# Patient Record
Sex: Female | Born: 1945
Health system: Southern US, Community
[De-identification: ages and names within clinical notes are randomized; demographics above are authoritative.]

## PROBLEM LIST (undated history)

## (undated) DIAGNOSIS — K219 Gastro-esophageal reflux disease without esophagitis: Secondary | ICD-10-CM

## (undated) DIAGNOSIS — I10 Essential (primary) hypertension: Secondary | ICD-10-CM

## (undated) DIAGNOSIS — E559 Vitamin D deficiency, unspecified: Secondary | ICD-10-CM

## (undated) DIAGNOSIS — E785 Hyperlipidemia, unspecified: Secondary | ICD-10-CM

## (undated) DIAGNOSIS — E119 Type 2 diabetes mellitus without complications: Secondary | ICD-10-CM

## (undated) DIAGNOSIS — F329 Major depressive disorder, single episode, unspecified: Secondary | ICD-10-CM

## (undated) DIAGNOSIS — H5319 Other subjective visual disturbances: Secondary | ICD-10-CM

## (undated) DIAGNOSIS — I499 Cardiac arrhythmia, unspecified: Secondary | ICD-10-CM

## (undated) DIAGNOSIS — D649 Anemia, unspecified: Secondary | ICD-10-CM

## (undated) DIAGNOSIS — F419 Anxiety disorder, unspecified: Secondary | ICD-10-CM

## (undated) DIAGNOSIS — C801 Malignant (primary) neoplasm, unspecified: Secondary | ICD-10-CM

## (undated) DIAGNOSIS — F32A Depression, unspecified: Secondary | ICD-10-CM

## (undated) DIAGNOSIS — K589 Irritable bowel syndrome without diarrhea: Secondary | ICD-10-CM

## (undated) HISTORY — DX: Cardiac arrhythmia, unspecified: I49.9

## (undated) HISTORY — DX: Other subjective visual disturbances: H53.19

## (undated) HISTORY — DX: Major depressive disorder, single episode, unspecified: F32.9

## (undated) HISTORY — DX: Essential (primary) hypertension: I10

## (undated) HISTORY — DX: Depression, unspecified: F32.A

## (undated) HISTORY — DX: Type 2 diabetes mellitus without complications: E11.9

## (undated) HISTORY — DX: Malignant (primary) neoplasm, unspecified: C80.1

## (undated) HISTORY — DX: Anxiety disorder, unspecified: F41.9

## (undated) HISTORY — PX: CARPAL TUNNEL RELEASE: SHX101

## (undated) HISTORY — PX: SHOULDER SURGERY: SHX246

## (undated) HISTORY — PX: CATARACT EXTRACTION W/ INTRAOCULAR LENS  IMPLANT, BILATERAL: SHX1307

## (undated) HISTORY — DX: Irritable bowel syndrome, unspecified: K58.9

## (undated) HISTORY — DX: Anemia, unspecified: D64.9

## (undated) HISTORY — DX: Vitamin D deficiency, unspecified: E55.9

## (undated) HISTORY — DX: Gastro-esophageal reflux disease without esophagitis: K21.9

## (undated) HISTORY — DX: Hyperlipidemia, unspecified: E78.5

---

## 1987-02-22 HISTORY — PX: BUNIONECTOMY: SHX129

## 1993-09-23 DIAGNOSIS — C801 Malignant (primary) neoplasm, unspecified: Secondary | ICD-10-CM

## 1993-09-23 HISTORY — DX: Malignant (primary) neoplasm, unspecified: C80.1

## 1998-11-30 ENCOUNTER — Other Ambulatory Visit: Admission: RE | Admit: 1998-11-30 | Discharge: 1998-11-30 | Payer: Self-pay | Admitting: *Deleted

## 1999-05-11 ENCOUNTER — Ambulatory Visit (HOSPITAL_COMMUNITY): Admission: RE | Admit: 1999-05-11 | Discharge: 1999-05-11 | Payer: Self-pay | Admitting: *Deleted

## 1999-10-05 ENCOUNTER — Ambulatory Visit (HOSPITAL_COMMUNITY): Admission: RE | Admit: 1999-10-05 | Discharge: 1999-10-05 | Payer: Self-pay | Admitting: *Deleted

## 1999-11-09 ENCOUNTER — Ambulatory Visit (HOSPITAL_COMMUNITY): Admission: RE | Admit: 1999-11-09 | Discharge: 1999-11-09 | Payer: Self-pay | Admitting: *Deleted

## 2000-06-30 ENCOUNTER — Ambulatory Visit (HOSPITAL_COMMUNITY): Admission: RE | Admit: 2000-06-30 | Discharge: 2000-06-30 | Payer: Self-pay | Admitting: Gastroenterology

## 2001-03-18 ENCOUNTER — Other Ambulatory Visit: Admission: RE | Admit: 2001-03-18 | Discharge: 2001-03-18 | Payer: Self-pay | Admitting: Internal Medicine

## 2001-09-30 ENCOUNTER — Ambulatory Visit (HOSPITAL_BASED_OUTPATIENT_CLINIC_OR_DEPARTMENT_OTHER): Admission: RE | Admit: 2001-09-30 | Discharge: 2001-09-30 | Payer: Self-pay | Admitting: *Deleted

## 2001-10-21 ENCOUNTER — Ambulatory Visit (HOSPITAL_BASED_OUTPATIENT_CLINIC_OR_DEPARTMENT_OTHER): Admission: RE | Admit: 2001-10-21 | Discharge: 2001-10-21 | Payer: Self-pay | Admitting: *Deleted

## 2002-03-17 ENCOUNTER — Ambulatory Visit (HOSPITAL_COMMUNITY): Admission: RE | Admit: 2002-03-17 | Discharge: 2002-03-17 | Payer: Self-pay | Admitting: *Deleted

## 2002-03-17 ENCOUNTER — Encounter: Payer: Self-pay | Admitting: *Deleted

## 2002-03-18 ENCOUNTER — Other Ambulatory Visit: Admission: RE | Admit: 2002-03-18 | Discharge: 2002-03-18 | Payer: Self-pay | Admitting: Internal Medicine

## 2002-07-26 HISTORY — PX: OTHER SURGICAL HISTORY: SHX169

## 2002-07-29 ENCOUNTER — Encounter: Payer: Self-pay | Admitting: Internal Medicine

## 2002-07-29 ENCOUNTER — Ambulatory Visit (HOSPITAL_COMMUNITY): Admission: RE | Admit: 2002-07-29 | Discharge: 2002-07-29 | Payer: Self-pay | Admitting: Internal Medicine

## 2002-08-17 ENCOUNTER — Ambulatory Visit (HOSPITAL_BASED_OUTPATIENT_CLINIC_OR_DEPARTMENT_OTHER): Admission: RE | Admit: 2002-08-17 | Discharge: 2002-08-17 | Payer: Self-pay | Admitting: *Deleted

## 2003-03-08 ENCOUNTER — Encounter: Admission: RE | Admit: 2003-03-08 | Discharge: 2003-06-06 | Payer: Self-pay | Admitting: Internal Medicine

## 2003-04-05 ENCOUNTER — Other Ambulatory Visit: Admission: RE | Admit: 2003-04-05 | Discharge: 2003-04-05 | Payer: Self-pay | Admitting: Internal Medicine

## 2003-04-13 ENCOUNTER — Ambulatory Visit (HOSPITAL_COMMUNITY): Admission: RE | Admit: 2003-04-13 | Discharge: 2003-04-13 | Payer: Self-pay | Admitting: Internal Medicine

## 2003-04-13 ENCOUNTER — Encounter: Payer: Self-pay | Admitting: Internal Medicine

## 2003-11-14 ENCOUNTER — Ambulatory Visit (HOSPITAL_COMMUNITY): Admission: RE | Admit: 2003-11-14 | Discharge: 2003-11-14 | Payer: Self-pay | Admitting: Gastroenterology

## 2004-03-08 ENCOUNTER — Encounter: Admission: RE | Admit: 2004-03-08 | Discharge: 2004-03-08 | Payer: Self-pay | Admitting: Orthopedic Surgery

## 2004-04-09 HISTORY — PX: ANTERIOR CRUCIATE LIGAMENT REPAIR: SHX115

## 2004-12-13 ENCOUNTER — Ambulatory Visit: Payer: Self-pay

## 2005-04-17 ENCOUNTER — Ambulatory Visit (HOSPITAL_COMMUNITY): Admission: RE | Admit: 2005-04-17 | Discharge: 2005-04-17 | Payer: Self-pay | Admitting: Internal Medicine

## 2005-05-23 ENCOUNTER — Ambulatory Visit (HOSPITAL_COMMUNITY): Admission: RE | Admit: 2005-05-23 | Discharge: 2005-05-23 | Payer: Self-pay | Admitting: Orthopedic Surgery

## 2005-05-23 ENCOUNTER — Ambulatory Visit (HOSPITAL_BASED_OUTPATIENT_CLINIC_OR_DEPARTMENT_OTHER): Admission: RE | Admit: 2005-05-23 | Discharge: 2005-05-24 | Payer: Self-pay | Admitting: Orthopedic Surgery

## 2005-05-23 HISTORY — PX: OTHER SURGICAL HISTORY: SHX169

## 2006-04-14 ENCOUNTER — Other Ambulatory Visit: Admission: RE | Admit: 2006-04-14 | Discharge: 2006-04-14 | Payer: Self-pay | Admitting: Internal Medicine

## 2006-04-18 ENCOUNTER — Ambulatory Visit (HOSPITAL_COMMUNITY): Admission: RE | Admit: 2006-04-18 | Discharge: 2006-04-18 | Payer: Self-pay | Admitting: Internal Medicine

## 2007-08-31 ENCOUNTER — Ambulatory Visit (HOSPITAL_COMMUNITY): Admission: RE | Admit: 2007-08-31 | Discharge: 2007-08-31 | Payer: Self-pay | Admitting: Internal Medicine

## 2007-09-03 ENCOUNTER — Encounter: Admission: RE | Admit: 2007-09-03 | Discharge: 2007-09-03 | Payer: Self-pay | Admitting: Internal Medicine

## 2008-03-08 HISTORY — PX: BUNIONECTOMY: SHX129

## 2008-08-24 ENCOUNTER — Encounter: Payer: Self-pay | Admitting: Cardiovascular Disease

## 2008-08-31 ENCOUNTER — Ambulatory Visit (HOSPITAL_COMMUNITY): Admission: RE | Admit: 2008-08-31 | Discharge: 2008-08-31 | Payer: Self-pay | Admitting: Internal Medicine

## 2008-09-29 ENCOUNTER — Ambulatory Visit (HOSPITAL_COMMUNITY): Admission: RE | Admit: 2008-09-29 | Discharge: 2008-09-29 | Payer: Self-pay | Admitting: Internal Medicine

## 2008-10-20 ENCOUNTER — Encounter: Admission: RE | Admit: 2008-10-20 | Discharge: 2008-10-20 | Payer: Self-pay | Admitting: Neurosurgery

## 2008-12-26 ENCOUNTER — Ambulatory Visit (HOSPITAL_COMMUNITY): Admission: RE | Admit: 2008-12-26 | Discharge: 2008-12-26 | Payer: Self-pay | Admitting: Internal Medicine

## 2009-07-10 ENCOUNTER — Ambulatory Visit (HOSPITAL_COMMUNITY): Admission: RE | Admit: 2009-07-10 | Discharge: 2009-07-10 | Payer: Self-pay | Admitting: Internal Medicine

## 2009-07-10 ENCOUNTER — Encounter: Payer: Self-pay | Admitting: Cardiovascular Disease

## 2009-08-08 ENCOUNTER — Ambulatory Visit: Payer: Self-pay | Admitting: Cardiovascular Disease

## 2009-08-08 DIAGNOSIS — I10 Essential (primary) hypertension: Secondary | ICD-10-CM | POA: Insufficient documentation

## 2009-08-08 DIAGNOSIS — K219 Gastro-esophageal reflux disease without esophagitis: Secondary | ICD-10-CM | POA: Insufficient documentation

## 2009-08-08 DIAGNOSIS — D649 Anemia, unspecified: Secondary | ICD-10-CM | POA: Insufficient documentation

## 2009-08-08 DIAGNOSIS — F325 Major depressive disorder, single episode, in full remission: Secondary | ICD-10-CM | POA: Insufficient documentation

## 2009-08-08 DIAGNOSIS — J45909 Unspecified asthma, uncomplicated: Secondary | ICD-10-CM | POA: Insufficient documentation

## 2009-08-25 ENCOUNTER — Other Ambulatory Visit: Admission: RE | Admit: 2009-08-25 | Discharge: 2009-08-25 | Payer: Self-pay | Admitting: Internal Medicine

## 2009-09-01 ENCOUNTER — Ambulatory Visit (HOSPITAL_COMMUNITY): Admission: RE | Admit: 2009-09-01 | Discharge: 2009-09-01 | Payer: Self-pay | Admitting: Cardiovascular Disease

## 2009-09-01 ENCOUNTER — Ambulatory Visit: Payer: Self-pay | Admitting: Internal Medicine

## 2009-09-01 ENCOUNTER — Encounter: Payer: Self-pay | Admitting: Cardiovascular Disease

## 2009-09-01 ENCOUNTER — Ambulatory Visit: Payer: Self-pay

## 2010-07-10 ENCOUNTER — Ambulatory Visit (HOSPITAL_COMMUNITY): Admission: RE | Admit: 2010-07-10 | Discharge: 2010-07-10 | Payer: Self-pay | Admitting: Internal Medicine

## 2010-10-14 ENCOUNTER — Encounter: Payer: Self-pay | Admitting: Internal Medicine

## 2011-02-08 NOTE — Op Note (Signed)
[place]. [facility]  Patient:    Nancy Myers, Nancy Myers Visit Number: [id number] MRN: [id number]          Service Type: DSU Location: [facility] Attending Physician:  Kendell Bane Dictated by:   Lowell Bouton, M.D. Proc. Date: 09/30/01 Admit Date:  09/30/2001                             Operative Report  PREOPERATIVE DIAGNOSIS:  Left carpal tunnel syndrome.  POSTOPERATIVE DIAGNOSIS:  Left carpal tunnel syndrome.  OPERATION PERFORMED:  Decompression median nerve, left carpal tunnel.  SURGEON:  Lowell Bouton, M.D.  ANESTHESIA:  0.5% Marcaine local with sedation.  OPERATIVE FINDINGS:  The patient had no masses in the carpal canal and the motor branch was intact.  The median nerve was significantly compressed.  DESCRIPTION OF PROCEDURE:  Under 0.5% Marcaine local anesthesia with a tourniquet on the left arm, the left hand was prepped and draped in the usual fashion and after exsanguinating the limb, the tourniquet was inflated to 250 mmHg.  A 3 cm longitudinal incision was made in the palm just ulnar to the thenar crease.  Sharp dissection was carried through the subcutaneous tissues and bleeding points were coagulated.  Blunt dissection was carried down through the superficial palmar fascia distal to the transverse carpal ligament.  A hemostat was placed in the carpal canal up against the hook of the hamate and the transverse carpal ligament was divided on the ulnar border of the median nerve.  The proximal end of the ligament was divided with the scissors after dissecting the nerve away from the undersurface of the ligament.  The carpal canal was then palpated and was found to be adequately decompressed.  The nerve was examined and motor branch identified.  The wound was irrigated with saline and the skin was closed with 4-0 nylon sutures. Sterile dressings were applied followed by a volar wrist splint.  The  patient tolerated the procedure well and went to the recovery room awake and stable in good condition. Dictated by:   Lowell Bouton, M.D. Attending Physician:  Kendell Bane DD:  09/30/01 TD:  09/30/01 Job: [id number]

## 2011-02-08 NOTE — Op Note (Signed)
Nancy Myers, Nancy Myers                         ACCOUNT NO.:  [id number]   MEDICAL RECORD NO.:  [id number]                   PATIENT TYPE:  AMB   LOCATION:  DSC                                  FACILITY:  [facility]   PHYSICIAN:  Reynolds Bowl, M.D.                 DATE OF BIRTH:  March 28, 1946   DATE OF PROCEDURE:  DATE OF DISCHARGE:                                 OPERATIVE REPORT   PREOPERATIVE DIAGNOSIS:  Left knee lateral meniscus tear and notch  ganglions.   POSTOPERATIVE DIAGNOSES:  1. 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  2. Posterior horn flap tear, medial meniscus.  3. Grade 2 changes, femoral trochlea.   OPERATIVE PROCEDURE:  1. Arthroscopy left knee.  2. Partial medial meniscectomy.  3. Incidental trimming of irregular edges of lateral femoral condyle.  4. Incidental minor debridement of chondromalacic lateral femoral condyle     along with lavage of particulate debris.   ANESTHESIA:  Local with standby.   DESCRIPTION OF PROCEDURE:  The patient was given anesthetic.  The left lower  extremity was fitted with a tourniquet which was isolated with a U-drape.  From the tips of the toes to the edges of the drape she was prepped with  DuraPrep. She was then draped in the usual manner, following which the leg  was exsanguinated using an Esmarch bandage and the proximal pneumatic  tourniquet elevated to 300 mmHg.  An anterolateral portal was established,  then anteromedial portal established by direct vision.  Immediately on  inspecting the suprapatellar area, there was a lot of particulate and white  debris washed out.  Past down around the cheek of the medial femoral  condyle, there was little bits of debris attached to synovial villi, and on  the medial side there were probably grade 2 changes of the tibial plateau  and femoral condyle, and at the posterior horn there was a posterior horn  flap tear in the medial meniscus.  While there this was  resected back to  stable tissue.  Moving into the notch area, there was irregular soft tissue  about the anterior cruciate which when removed revealed intact cruciate.  Laterally, there were grade 3 chondromalacia changes, and this was the  assumed source of the particulate debris.  This articular cartilage could be  rubbed and debris liberated.  I used a rotary meniscotome and very lightly  debrided this. The posterior horn of the lateral meniscus had a fairly  ragged frayed edge, which incidentally was trimmed, and the remainder of the  meniscus then appeared to be stable.  Went back and used the scope and probe  for bits of debris which were removed and on the way out took a photograph  of the femoral trochlea which shows grade 2 and 3 changes of the femoral  trochlea.  Having lavaged the joint of free particulate matter, I removed  the instruments, closed the anteromedial portal, then injected Marcaine with  epinephrine to the anterolateral portal, then closed that portal, then  dressed the knee with 4 x 4s, ABD, Kerlix, Webril, then an Ace wrap.   DISCHARGE INSTRUCTIONS:  1. The patient is to take Vicodin for pain, as discussed.  2. Take all usual medications.  3. Do frequent quad sets and ankle pumps.  4. Use cold packs, as tolerated.  5.     Be at house rest.  6. Walk full weightbearing, minimizing knee motion for about three days.  7. Call me with concerns, otherwise see me in the office as planned.                                               Reynolds Bowl, M.D.    JWK/MEDQ  D:  08/17/2002  T:  08/17/2002  Job:  [id number]

## 2011-02-08 NOTE — Op Note (Signed)
NAMESURIE, SUCHOCKI             ACCOUNT NO.:  [id number]   MEDICAL RECORD NO.:  [id number]          PATIENT TYPE:  AMB   LOCATION:  DSC                          FACILITY:  [facility]   PHYSICIAN:  Nadara Mustard, MD     DATE OF BIRTH:  1946/02/05   DATE OF PROCEDURE:  05/23/2005  DATE OF DISCHARGE:                                 OPERATIVE REPORT   PREOP DIAGNOSIS:  Unstable left foot Lisfranc fracture-dislocation.   POSTOP DIAGNOSIS:  Unstable left foot Lisfranc fracture-dislocation.   PROCEDURE:  Open reduction internal fixation left foot, Lisfranc fracture-  dislocation.   SURGEON:  Nadara Mustard, MD   ANESTHESIA:  Popliteal block plus LMA.   ESTIMATED BLOOD LOSS:  Minimal.   ANTIBIOTICS:  1 gram of Kefzol.   TOURNIQUET TIME:  Esmarch at the ankle for approximately 34 minutes.   DISPOSITION:  To PACU in stable condition.   INDICATIONS FOR PROCEDURE:  The patient is a 65 year old woman with a  Lisfranc fracture-dislocation who has failed conservative care and presents,  at this time, for a surgical stabilization. The risks and benefits were  discussed including infection, neurovascular injury, persistent pain, need  for additional surgery. The patient states he understands and wishes to  proceed at this time.   DESCRIPTION OF PROCEDURE:  The patient was brought to OR room five after  undergoing a popliteal block. After an adequate level of anesthesia  obtained, the patient's left lower extremity was prepped using DuraPrep and  draped into a sterile field. An Esmarch was wrapped around the ankle for  tourniquet control. A dorsal incision was made over the Lisfranc joint.  Blunt dissection was carried down to the dorsalis pedis neurovascular  bundle. This was retracted laterally and the first metatarsal medial  cuneiform joint was identified. This joint was unstable and this was  stabilized using a 3.5 cortical screw, 40-mm in length and the screw was  placed  dorsal-distal to proximal-plantar, extending from the first  metatarsal into the medial and middle cuneiforms.   Attention was then focused to the Lisfranc joint. The Lisfranc joint was  reduced. An oblique screw was placed from the medial cuneiform across the  Lisfranc joint into the second metatarsal. This screw also was 40 mm in  length. A third screw was then placed from dorsal-distal of the second  metatarsal to plantar-proximal into the middle cuneiform. C-arm fluoroscopy  verified reduction in both AP and lateral planes. The patient did have a  branch of the deep peroneal nerve which was scarred down to the Lisfranc  joint. This was elevated. The distal aspect of the nerve was constricted.  The nerve was left intact. The Esmarch was released.   The wound was irrigated with normal saline and the incisions were closed  using 2-0 nylon with a far-and-near and near-and-far suture without any  tension on the skin. The wounds were covered with Adaptic, orthopedic  sponges, sterile Webril and a Coban dressing. The patient was extubated and  taken to PACU in stable condition.      Nadara Mustard, MD  Electronically Signed  MVD/MEDQ  D:  05/23/2005  T:  05/23/2005  Job:  [id number]

## 2011-02-08 NOTE — Op Note (Signed)
[place]. [facility]  Patient:    MELONI, HINZ Visit Number: [id number] MRN: [id number]          Service Type: DSU Location: [facility] Attending Physician:  Kendell Bane Dictated by:   Lowell Bouton, M.D. Proc. Date: 10/21/01 Admit Date:  10/21/2001                             Operative Report  PREOPERATIVE DIAGNOSIS:  Right carpal tunnel syndrome.  POSTOPERATIVE DIAGNOSIS:  Right carpal tunnel syndrome.  PROCEDURE:  Decompression of median nerve, right carpal tunnel.  SURGEON:  Lowell Bouton, M.D.  ANESTHESIA:  Half percent Marcaine local with sedation.  OPERATIVE FINDINGS:  The patient had no masses in the carpal canal and the motor branch was intact.  DESCRIPTION OF PROCEDURE:  Under 0.5% Marcaine local anesthesia with a tourniquet on the right arm, the right hand was prepped and draped in the usual fashion, and after exsanguinating the limb, the tourniquet was inflated to 225 mmHg.  A 3 cm longitudinal incision was made in the palm just ulnar to the thenar crease.  Sharp dissection was carried through the subcutaneous tissues and bleeding points were coagulated.  Blunt dissection was carried through the superficial palmar fascia down to the transverse carpal ligament. A hemostat was then placed in the carpal canal up against the hook of the hamate and the transverse carpal ligament was divided on the ulnar border of the median nerve.  The proximal end of the ligament was divided with the scissors after dissecting the nerve away from the under surface of the ligament.  The carpal canal was then palpated and was found to be adequately decompressed.  The nerve was examined and the motor branch was identified. The carpal canal was examined and there were no masses present.  The wound was then irrigated with saline and the skin was closed with 4-0 nylon sutures. Sterile dressings were applied followed by a volar  wrist splint.  The patient tolerated the procedure well and went to the recovery room awake, stable, and in good condition. Dictated by:   Lowell Bouton, M.D. Attending Physician:  Kendell Bane DD:  10/21/01 TD:  10/22/01 Job: [phone]

## 2011-02-08 NOTE — Op Note (Signed)
NAMESKARLETH, DELMONICO                         ACCOUNT NO.:  [id number]   MEDICAL RECORD NO.:  [id number]                   PATIENT TYPE:  AMB   LOCATION:  ENDO                                 FACILITY:  [facility]   PHYSICIAN:  James L. Malon Kindle., M.D.          DATE OF BIRTH:  02-Sep-1946   DATE OF PROCEDURE:  11/14/2003  DATE OF DISCHARGE:                                 OPERATIVE REPORT   PROCEDURE:  Colonoscopy.   MEDICATIONS:  Fentanyl 75 mcg, Versed 6 mg IV.   ENDOSCOPE:  Olympus pediatric colonoscope.   INDICATIONS:  Previous history of adenomatous colon polyps, V12.72.   DESCRIPTION OF PROCEDURE:  The procedure had been explained to the patient  and consent obtained.  With the patient in the left lateral decubitus  position, the Olympus scope was inserted and advanced.  The prep was  excellent.  The patient had a long, tortuous colon, and we were able to  advance to the cecum.  The ileocecal valve and appendiceal orifice were  seen.  Abdominal pressure and position changes were required, and with the  patient in the right lateral decubitus position we were able to reach the  cecum.  The terminal ileum was actually entered for a short distance and was  normal.  The scope was withdrawn and the cecum, ascending colon, transverse  colon, splenic flexure, descending and sigmoid colon were seen well and no  polyps were seen.  There was no diverticular disease of significance.  The  scope was withdrawn and the rectum was free of polyps.  The patient  tolerated the procedure well.   ASSESSMENT:  Previous history of colon polyps, V12.72.  Negative colonoscopy  now.   PLAN:  Will recommend yearly Hemoccults and recommend repeating colonoscopy  in five years.                                               James L. Malon Kindle., M.D.    Waldron Session  D:  11/14/2003  T:  11/14/2003  Job:  [id number]   cc:   Lovenia Kim, D.O.  [address], [address]  [place]  [place] [address]  Fax: [phone]

## 2011-02-08 NOTE — Procedures (Signed)
[facility]  Patient:    Nancy Myers, Nancy Myers                    MRN: [id number] Proc. Date: 06/30/00 Adm. Date:  [id number] Attending:  Orland Mustard CC:         Nicky Pugh. Lenon Ahmadi, M.D.   Procedure Report  PROCEDURE:  Colonoscopy.  MEDICATIONS:  Fentanyl 75 mcg, Versed 7 mg IV.  INDICATIONS:  A previous history of adenomatous colon polyp.  SCOPE:  Adult Olympus video colonoscope.  DESCRIPTION OF PROCEDURE:  The procedure had been explained to the patient and consent obtained.  With the patient in the left lateral decubitus, the adult Olympus adult video colonoscope was inserted and advanced under direct visualization.  The prep was excellent.  I was able to reach the cecum using abdominal pressure and position changes.  The ileocecal valve and appendiceal orifice were identified.  The scope was withdrawn.  The cecum and ascending colon, hepatic flexure, transverse colon, splenic flexure, descending, and sigmoid colon were seen well upon removal.  No polyps or other lesions seen. Internal hemorrhoids seen in the rectum upon removal of the scope.  The scope was withdrawn, and the patient tolerated the procedure well.  ASSESSMENT: 1. No evidence of polyps. 2. Internal hemorrhoids.  PLAN:  Will recommend repeating in three years since this woman did have a previous history of polyps with cancer in the polyp. DD:  06/30/00 TD:  07/01/00 Job: [id number]

## 2011-09-03 ENCOUNTER — Other Ambulatory Visit (HOSPITAL_COMMUNITY): Payer: Self-pay | Admitting: Internal Medicine

## 2011-09-03 ENCOUNTER — Ambulatory Visit (HOSPITAL_COMMUNITY)
Admission: RE | Admit: 2011-09-03 | Discharge: 2011-09-03 | Disposition: A | Discharge status: Home / Self Care | Payer: BC Managed Care – PPO | Source: Ambulatory Visit | Attending: Internal Medicine | Admitting: Internal Medicine

## 2011-09-03 DIAGNOSIS — Z1231 Encounter for screening mammogram for malignant neoplasm of breast: Secondary | ICD-10-CM

## 2011-09-03 DIAGNOSIS — J209 Acute bronchitis, unspecified: Secondary | ICD-10-CM

## 2011-09-03 DIAGNOSIS — I10 Essential (primary) hypertension: Secondary | ICD-10-CM | POA: Insufficient documentation

## 2011-09-03 DIAGNOSIS — J45909 Unspecified asthma, uncomplicated: Secondary | ICD-10-CM | POA: Insufficient documentation

## 2011-10-02 DIAGNOSIS — M25579 Pain in unspecified ankle and joints of unspecified foot: Secondary | ICD-10-CM | POA: Diagnosis not present

## 2011-10-02 DIAGNOSIS — M25569 Pain in unspecified knee: Secondary | ICD-10-CM | POA: Diagnosis not present

## 2011-10-08 ENCOUNTER — Ambulatory Visit (HOSPITAL_COMMUNITY)
Admission: RE | Admit: 2011-10-08 | Discharge: 2011-10-08 | Disposition: A | Discharge status: Home / Self Care | Payer: BC Managed Care – PPO | Source: Ambulatory Visit | Attending: Internal Medicine | Admitting: Internal Medicine

## 2011-10-08 DIAGNOSIS — Z1231 Encounter for screening mammogram for malignant neoplasm of breast: Secondary | ICD-10-CM | POA: Insufficient documentation

## 2011-12-18 DIAGNOSIS — I10 Essential (primary) hypertension: Secondary | ICD-10-CM | POA: Diagnosis not present

## 2011-12-18 DIAGNOSIS — R5381 Other malaise: Secondary | ICD-10-CM | POA: Diagnosis not present

## 2011-12-18 DIAGNOSIS — R002 Palpitations: Secondary | ICD-10-CM | POA: Diagnosis not present

## 2011-12-18 DIAGNOSIS — R5383 Other fatigue: Secondary | ICD-10-CM | POA: Diagnosis not present

## 2011-12-18 DIAGNOSIS — E782 Mixed hyperlipidemia: Secondary | ICD-10-CM | POA: Diagnosis not present

## 2012-01-21 DIAGNOSIS — R109 Unspecified abdominal pain: Secondary | ICD-10-CM | POA: Diagnosis not present

## 2012-01-22 ENCOUNTER — Other Ambulatory Visit: Payer: Self-pay | Admitting: Internal Medicine

## 2012-01-23 ENCOUNTER — Ambulatory Visit
Admission: RE | Admit: 2012-01-23 | Discharge: 2012-01-23 | Disposition: A | Discharge status: Home / Self Care | Payer: Medicare Other | Source: Ambulatory Visit | Attending: Internal Medicine | Admitting: Internal Medicine

## 2012-01-23 DIAGNOSIS — N949 Unspecified condition associated with female genital organs and menstrual cycle: Secondary | ICD-10-CM | POA: Diagnosis not present

## 2012-01-23 DIAGNOSIS — K5732 Diverticulitis of large intestine without perforation or abscess without bleeding: Secondary | ICD-10-CM | POA: Diagnosis not present

## 2012-01-23 MED ORDER — IOHEXOL 300 MG/ML  SOLN
100.0000 mL | Freq: Once | INTRAMUSCULAR | Status: AC | PRN
  Administered 2012-01-23: 100 mL via INTRAVENOUS

## 2012-01-24 ENCOUNTER — Other Ambulatory Visit: Payer: Self-pay | Admitting: Internal Medicine

## 2012-01-24 ENCOUNTER — Ambulatory Visit
Admission: RE | Admit: 2012-01-24 | Discharge: 2012-01-24 | Disposition: A | Discharge status: Home / Self Care | Payer: BC Managed Care – PPO | Source: Ambulatory Visit | Attending: Internal Medicine | Admitting: Internal Medicine

## 2012-01-24 DIAGNOSIS — T18108A Unspecified foreign body in esophagus causing other injury, initial encounter: Secondary | ICD-10-CM

## 2012-01-27 DIAGNOSIS — I10 Essential (primary) hypertension: Secondary | ICD-10-CM | POA: Diagnosis not present

## 2012-01-27 DIAGNOSIS — R109 Unspecified abdominal pain: Secondary | ICD-10-CM | POA: Diagnosis not present

## 2012-01-29 DIAGNOSIS — K921 Melena: Secondary | ICD-10-CM | POA: Diagnosis not present

## 2012-01-29 DIAGNOSIS — K5732 Diverticulitis of large intestine without perforation or abscess without bleeding: Secondary | ICD-10-CM | POA: Diagnosis not present

## 2012-01-29 DIAGNOSIS — Z86010 Personal history of colonic polyps: Secondary | ICD-10-CM | POA: Diagnosis not present

## 2012-01-31 DIAGNOSIS — I10 Essential (primary) hypertension: Secondary | ICD-10-CM | POA: Diagnosis not present

## 2012-01-31 DIAGNOSIS — R5381 Other malaise: Secondary | ICD-10-CM | POA: Diagnosis not present

## 2012-01-31 DIAGNOSIS — R5383 Other fatigue: Secondary | ICD-10-CM | POA: Diagnosis not present

## 2012-02-01 LAB — HM COLONOSCOPY

## 2012-02-06 DIAGNOSIS — I10 Essential (primary) hypertension: Secondary | ICD-10-CM | POA: Diagnosis not present

## 2012-02-11 DIAGNOSIS — K5730 Diverticulosis of large intestine without perforation or abscess without bleeding: Secondary | ICD-10-CM | POA: Diagnosis not present

## 2012-02-11 DIAGNOSIS — Z86010 Personal history of colonic polyps: Secondary | ICD-10-CM | POA: Diagnosis not present

## 2012-02-11 DIAGNOSIS — K648 Other hemorrhoids: Secondary | ICD-10-CM | POA: Diagnosis not present

## 2012-02-11 DIAGNOSIS — R195 Other fecal abnormalities: Secondary | ICD-10-CM | POA: Diagnosis not present

## 2012-03-16 DIAGNOSIS — K5732 Diverticulitis of large intestine without perforation or abscess without bleeding: Secondary | ICD-10-CM | POA: Diagnosis not present

## 2012-03-30 DIAGNOSIS — H52209 Unspecified astigmatism, unspecified eye: Secondary | ICD-10-CM | POA: Diagnosis not present

## 2012-03-30 DIAGNOSIS — E119 Type 2 diabetes mellitus without complications: Secondary | ICD-10-CM | POA: Diagnosis not present

## 2012-03-30 DIAGNOSIS — Z961 Presence of intraocular lens: Secondary | ICD-10-CM | POA: Diagnosis not present

## 2012-04-14 DIAGNOSIS — E559 Vitamin D deficiency, unspecified: Secondary | ICD-10-CM | POA: Diagnosis not present

## 2012-04-14 DIAGNOSIS — Z79899 Other long term (current) drug therapy: Secondary | ICD-10-CM | POA: Diagnosis not present

## 2012-04-14 DIAGNOSIS — E119 Type 2 diabetes mellitus without complications: Secondary | ICD-10-CM | POA: Diagnosis not present

## 2012-04-14 DIAGNOSIS — E782 Mixed hyperlipidemia: Secondary | ICD-10-CM | POA: Diagnosis not present

## 2012-04-14 DIAGNOSIS — I10 Essential (primary) hypertension: Secondary | ICD-10-CM | POA: Diagnosis not present

## 2012-07-15 DIAGNOSIS — E782 Mixed hyperlipidemia: Secondary | ICD-10-CM | POA: Diagnosis not present

## 2012-07-15 DIAGNOSIS — E119 Type 2 diabetes mellitus without complications: Secondary | ICD-10-CM | POA: Diagnosis not present

## 2012-07-15 DIAGNOSIS — Z23 Encounter for immunization: Secondary | ICD-10-CM | POA: Diagnosis not present

## 2012-07-15 DIAGNOSIS — I10 Essential (primary) hypertension: Secondary | ICD-10-CM | POA: Diagnosis not present

## 2012-07-15 DIAGNOSIS — E559 Vitamin D deficiency, unspecified: Secondary | ICD-10-CM | POA: Diagnosis not present

## 2012-07-15 DIAGNOSIS — R079 Chest pain, unspecified: Secondary | ICD-10-CM | POA: Diagnosis not present

## 2012-07-20 DIAGNOSIS — I10 Essential (primary) hypertension: Secondary | ICD-10-CM | POA: Diagnosis not present

## 2012-09-03 DIAGNOSIS — E559 Vitamin D deficiency, unspecified: Secondary | ICD-10-CM | POA: Diagnosis not present

## 2012-09-03 DIAGNOSIS — D518 Other vitamin B12 deficiency anemias: Secondary | ICD-10-CM | POA: Diagnosis not present

## 2012-09-03 DIAGNOSIS — Z23 Encounter for immunization: Secondary | ICD-10-CM | POA: Diagnosis not present

## 2012-09-03 DIAGNOSIS — E782 Mixed hyperlipidemia: Secondary | ICD-10-CM | POA: Diagnosis not present

## 2012-09-03 DIAGNOSIS — I10 Essential (primary) hypertension: Secondary | ICD-10-CM | POA: Diagnosis not present

## 2012-09-03 DIAGNOSIS — E119 Type 2 diabetes mellitus without complications: Secondary | ICD-10-CM | POA: Diagnosis not present

## 2012-09-03 DIAGNOSIS — Z79899 Other long term (current) drug therapy: Secondary | ICD-10-CM | POA: Diagnosis not present

## 2012-09-28 DIAGNOSIS — M810 Age-related osteoporosis without current pathological fracture: Secondary | ICD-10-CM | POA: Diagnosis not present

## 2012-12-11 DIAGNOSIS — L500 Allergic urticaria: Secondary | ICD-10-CM | POA: Diagnosis not present

## 2012-12-11 DIAGNOSIS — I10 Essential (primary) hypertension: Secondary | ICD-10-CM | POA: Diagnosis not present

## 2012-12-15 DIAGNOSIS — I10 Essential (primary) hypertension: Secondary | ICD-10-CM | POA: Diagnosis not present

## 2012-12-15 DIAGNOSIS — E119 Type 2 diabetes mellitus without complications: Secondary | ICD-10-CM | POA: Diagnosis not present

## 2012-12-15 DIAGNOSIS — E559 Vitamin D deficiency, unspecified: Secondary | ICD-10-CM | POA: Diagnosis not present

## 2012-12-15 DIAGNOSIS — Z79899 Other long term (current) drug therapy: Secondary | ICD-10-CM | POA: Diagnosis not present

## 2012-12-15 DIAGNOSIS — E782 Mixed hyperlipidemia: Secondary | ICD-10-CM | POA: Diagnosis not present

## 2013-01-04 DIAGNOSIS — L509 Urticaria, unspecified: Secondary | ICD-10-CM | POA: Diagnosis not present

## 2013-01-04 DIAGNOSIS — R5383 Other fatigue: Secondary | ICD-10-CM | POA: Diagnosis not present

## 2013-01-04 DIAGNOSIS — R5381 Other malaise: Secondary | ICD-10-CM | POA: Diagnosis not present

## 2013-03-17 DIAGNOSIS — E119 Type 2 diabetes mellitus without complications: Secondary | ICD-10-CM | POA: Diagnosis not present

## 2013-03-17 DIAGNOSIS — E782 Mixed hyperlipidemia: Secondary | ICD-10-CM | POA: Diagnosis not present

## 2013-03-17 DIAGNOSIS — I10 Essential (primary) hypertension: Secondary | ICD-10-CM | POA: Diagnosis not present

## 2013-03-17 DIAGNOSIS — Z79899 Other long term (current) drug therapy: Secondary | ICD-10-CM | POA: Diagnosis not present

## 2013-03-17 DIAGNOSIS — E559 Vitamin D deficiency, unspecified: Secondary | ICD-10-CM | POA: Diagnosis not present

## 2013-06-14 ENCOUNTER — Other Ambulatory Visit (HOSPITAL_COMMUNITY): Payer: Self-pay | Admitting: Internal Medicine

## 2013-06-14 DIAGNOSIS — Z1231 Encounter for screening mammogram for malignant neoplasm of breast: Secondary | ICD-10-CM

## 2013-06-15 LAB — HM MAMMOGRAPHY: HM Mammogram: NEGATIVE

## 2013-06-17 ENCOUNTER — Ambulatory Visit (HOSPITAL_COMMUNITY)
Admission: RE | Admit: 2013-06-17 | Discharge: 2013-06-17 | Disposition: A | Discharge status: Home / Self Care | Payer: Medicare Other | Source: Ambulatory Visit | Attending: Internal Medicine | Admitting: Internal Medicine

## 2013-06-17 DIAGNOSIS — R059 Cough, unspecified: Secondary | ICD-10-CM | POA: Diagnosis not present

## 2013-06-17 DIAGNOSIS — Z1231 Encounter for screening mammogram for malignant neoplasm of breast: Secondary | ICD-10-CM | POA: Diagnosis not present

## 2013-06-17 DIAGNOSIS — R5381 Other malaise: Secondary | ICD-10-CM | POA: Diagnosis not present

## 2013-06-17 DIAGNOSIS — I10 Essential (primary) hypertension: Secondary | ICD-10-CM | POA: Diagnosis not present

## 2013-06-17 DIAGNOSIS — E538 Deficiency of other specified B group vitamins: Secondary | ICD-10-CM | POA: Diagnosis not present

## 2013-06-17 DIAGNOSIS — E782 Mixed hyperlipidemia: Secondary | ICD-10-CM | POA: Diagnosis not present

## 2013-06-17 DIAGNOSIS — Z23 Encounter for immunization: Secondary | ICD-10-CM | POA: Diagnosis not present

## 2013-06-17 DIAGNOSIS — E559 Vitamin D deficiency, unspecified: Secondary | ICD-10-CM | POA: Diagnosis not present

## 2013-06-17 DIAGNOSIS — Z79899 Other long term (current) drug therapy: Secondary | ICD-10-CM | POA: Diagnosis not present

## 2013-06-17 DIAGNOSIS — E119 Type 2 diabetes mellitus without complications: Secondary | ICD-10-CM | POA: Diagnosis not present

## 2013-06-17 DIAGNOSIS — R05 Cough: Secondary | ICD-10-CM | POA: Diagnosis not present

## 2013-08-02 ENCOUNTER — Emergency Department (HOSPITAL_COMMUNITY): Payer: Medicare Other

## 2013-08-02 ENCOUNTER — Ambulatory Visit: Payer: Medicare Other | Admitting: Emergency Medicine

## 2013-08-02 ENCOUNTER — Encounter: Payer: Self-pay | Admitting: Emergency Medicine

## 2013-08-02 ENCOUNTER — Encounter (HOSPITAL_COMMUNITY): Payer: Self-pay | Admitting: Emergency Medicine

## 2013-08-02 ENCOUNTER — Emergency Department (HOSPITAL_COMMUNITY)
Admission: EM | Admit: 2013-08-02 | Discharge: 2013-08-02 | Disposition: A | Discharge status: Home / Self Care | Payer: Medicare Other | Attending: Emergency Medicine | Admitting: Emergency Medicine

## 2013-08-02 VITALS — BP 126/84 | HR 76 | Temp 98.6°F | Resp 18 | Wt 154.0 lb

## 2013-08-02 DIAGNOSIS — E785 Hyperlipidemia, unspecified: Secondary | ICD-10-CM | POA: Insufficient documentation

## 2013-08-02 DIAGNOSIS — K219 Gastro-esophageal reflux disease without esophagitis: Secondary | ICD-10-CM | POA: Insufficient documentation

## 2013-08-02 DIAGNOSIS — K589 Irritable bowel syndrome without diarrhea: Secondary | ICD-10-CM | POA: Diagnosis not present

## 2013-08-02 DIAGNOSIS — F411 Generalized anxiety disorder: Secondary | ICD-10-CM | POA: Diagnosis not present

## 2013-08-02 DIAGNOSIS — E119 Type 2 diabetes mellitus without complications: Secondary | ICD-10-CM | POA: Diagnosis not present

## 2013-08-02 DIAGNOSIS — Z85038 Personal history of other malignant neoplasm of large intestine: Secondary | ICD-10-CM | POA: Diagnosis not present

## 2013-08-02 DIAGNOSIS — Z8249 Family history of ischemic heart disease and other diseases of the circulatory system: Secondary | ICD-10-CM | POA: Insufficient documentation

## 2013-08-02 DIAGNOSIS — E559 Vitamin D deficiency, unspecified: Secondary | ICD-10-CM | POA: Diagnosis not present

## 2013-08-02 DIAGNOSIS — I498 Other specified cardiac arrhythmias: Secondary | ICD-10-CM | POA: Insufficient documentation

## 2013-08-02 DIAGNOSIS — R079 Chest pain, unspecified: Secondary | ICD-10-CM

## 2013-08-02 DIAGNOSIS — E1149 Type 2 diabetes mellitus with other diabetic neurological complication: Secondary | ICD-10-CM | POA: Insufficient documentation

## 2013-08-02 DIAGNOSIS — Z885 Allergy status to narcotic agent status: Secondary | ICD-10-CM | POA: Insufficient documentation

## 2013-08-02 DIAGNOSIS — M79609 Pain in unspecified limb: Secondary | ICD-10-CM | POA: Insufficient documentation

## 2013-08-02 DIAGNOSIS — F329 Major depressive disorder, single episode, unspecified: Secondary | ICD-10-CM | POA: Insufficient documentation

## 2013-08-02 DIAGNOSIS — Z79899 Other long term (current) drug therapy: Secondary | ICD-10-CM | POA: Insufficient documentation

## 2013-08-02 DIAGNOSIS — D649 Anemia, unspecified: Secondary | ICD-10-CM | POA: Diagnosis not present

## 2013-08-02 DIAGNOSIS — F3289 Other specified depressive episodes: Secondary | ICD-10-CM | POA: Diagnosis not present

## 2013-08-02 DIAGNOSIS — Z87891 Personal history of nicotine dependence: Secondary | ICD-10-CM | POA: Diagnosis not present

## 2013-08-02 DIAGNOSIS — I10 Essential (primary) hypertension: Secondary | ICD-10-CM | POA: Diagnosis not present

## 2013-08-02 LAB — CBC
HCT: 37.4 % (ref 36.0–46.0)
MCH: 28 pg (ref 26.0–34.0)
MCHC: 34.2 g/dL (ref 30.0–36.0)
Platelets: 319 10*3/uL (ref 150–400)
RDW: 13.2 % (ref 11.5–15.5)

## 2013-08-02 LAB — BASIC METABOLIC PANEL
BUN: 15 mg/dL (ref 6–23)
Calcium: 9.6 mg/dL (ref 8.4–10.5)
Creatinine, Ser: 0.78 mg/dL (ref 0.50–1.10)
GFR calc Af Amer: >90 mL/min (ref >90)
GFR calc non Af Amer: 85 mL/min — ABNORMAL LOW (ref >90)
Potassium: 3.9 mEq/L (ref 3.5–5.1)

## 2013-08-02 LAB — POCT I-STAT TROPONIN I: Troponin i, poc: 0 ng/mL (ref 0.00–0.08)

## 2013-08-02 LAB — TROPONIN I: Troponin I: <0.3 ng/mL (ref <0.30)

## 2013-08-02 MED ORDER — ASPIRIN 81 MG PO CHEW
324.0000 mg | CHEWABLE_TABLET | Freq: Once | ORAL | Status: AC
  Administered 2013-08-02: 324 mg via ORAL
  Filled 2013-08-02: qty 4

## 2013-08-02 NOTE — ED Notes (Signed)
Pt st's she started having left chest pain approx 1 month ago radiating into upper back.  St's pain would come and go.  Today she started having pain again and went to her MD's office and was told to come to ED.  Pt denies any chest pain or other complaints at this time.  Pt playing a game on her phone.

## 2013-08-02 NOTE — ED Provider Notes (Signed)
CSN: [id number]     Arrival date & time 08/02/13  1630 History   First MD Initiated Contact with Patient 08/02/13 1830     Chief Complaint  Patient presents with  . Chest Pain   (Consider location/radiation/quality/duration/timing/severity/associated sxs/prior Treatment) HPI Comments: 67 yo female with DM, HTN, gerd, lipids, FH cardiac, past smoker presents with intermittent chest pain episodes the past week.  On Tue, Friday and today at 3 pm pt had brief 30 sec of chest cramp left sided, one time it radiated down left arm.  No exertional/ diaphoresis or back radiation. Stress test 2 yrs ago nl per pt.  No known cardiac hx. Pt feels well now and asking to go home.  Pt on baby asa daily.    Patient is a 67 y.o. female presenting with chest pain. The history is provided by the patient.  Chest Pain Associated symptoms: no abdominal pain, no back pain, no cough, no fever, no headache, no shortness of breath and not vomiting     Past Medical History  Diagnosis Date  . Hypertension   . Hyperlipidemia   . Diabetes mellitus without complication   . GERD (gastroesophageal reflux disease)   . Anxiety   . Depression   . Anemia   . IBS (irritable bowel syndrome)   . Vitamin D deficiency   . Cancer 1995    Colon Cancer   History reviewed. No pertinent past surgical history. History reviewed. No pertinent family history. History  Substance Use Topics  . Smoking status: Former Smoker    Quit date: 09/23/1986  . Smokeless tobacco: Not on file  . Alcohol Use: Not on file   OB History   Grav Para Term Preterm Abortions TAB SAB Ect Mult Living                 Review of Systems  Constitutional: Negative for fever and chills.  HENT: Negative for congestion.   Eyes: Negative for visual disturbance.  Respiratory: Negative for cough and shortness of breath.   Cardiovascular: Positive for chest pain.  Gastrointestinal: Negative for vomiting and abdominal pain.  Genitourinary: Negative for  dysuria and flank pain.  Musculoskeletal: Negative for back pain, neck pain and neck stiffness.  Skin: Negative for rash.  Neurological: Negative for light-headedness and headaches.    Allergies  Codeine  Home Medications   Current Outpatient Rx  Name  Route  Sig  Dispense  Refill  . atorvastatin (LIPITOR) 40 MG tablet   Oral   Take 40 mg by mouth daily.         . carisoprodol (SOMA) 350 MG tablet   Oral   Take 350 mg by mouth as needed for muscle spasms.         . Cholecalciferol (VITAMIN D) 1000 UNITS capsule   Oral   Take 1,000 Units by mouth daily. Monday-Fridays =10,000iu Sat and Sun = 5,000iu         . estrogen, conjugated,-medroxyprogesterone (PREMPRO) 0.3-1.5 MG per tablet   Oral   Take 1 tablet by mouth daily.         Marland Kitchen FLUoxetine (PROZAC) 20 MG capsule   Oral   Take 20 mg by mouth 2 (two) times daily.         . hydrochlorothiazide (HYDRODIURIL) 25 MG tablet   Oral   Take 25 mg by mouth daily.         Marland Kitchen losartan (COZAAR) 100 MG tablet   Oral   Take 100 mg  by mouth daily.         . Magnesium 500 MG TABS   Oral   Take by mouth.         . metFORMIN (GLUCOPHAGE) 500 MG tablet   Oral   Take 500 mg by mouth daily.         . metoprolol (TOPROL-XL) 200 MG 24 hr tablet   Oral   Take 200 mg by mouth daily.          BP 144/82  Pulse 56  Temp(Src) 98.5 F (36.9 C) (Oral)  Resp 17  SpO2 99% Physical Exam  Nursing note and vitals reviewed. Constitutional: She is oriented to person, place, and time. She appears well-developed and well-nourished.  HENT:  Head: Normocephalic and atraumatic.  Eyes: Conjunctivae are normal. Right eye exhibits no discharge. Left eye exhibits no discharge.  Neck: Normal range of motion. Neck supple. No tracheal deviation present.  Cardiovascular: Normal rate, regular rhythm and intact distal pulses.   No murmur heard. Pulmonary/Chest: Effort normal and breath sounds normal.  Abdominal: Soft. She exhibits  no distension. There is no tenderness. There is no guarding.  Musculoskeletal: She exhibits no edema and no tenderness.  Neurological: She is alert and oriented to person, place, and time.  Skin: Skin is warm. No rash noted.  Psychiatric: She has a normal mood and affect.    ED Course  Procedures (including critical care time) Labs Review Labs Reviewed  BASIC METABOLIC PANEL - Abnormal; Notable for the following:    GFR calc non Af Amer 85 (*)    All other components within normal limits  CBC  TROPONIN I  POCT I-STAT TROPONIN I   Imaging Review Dg Chest 2 View  08/02/2013   CLINICAL DATA:  Discomfort at left upper chest for 1 month. Family history of heart disease. Diabetic.  EXAM: CHEST  2 VIEW  COMPARISON:  09/03/2011  FINDINGS: Mid thoracic spondylosis which is moderate. Midline trachea. Normal heart size. Mildly tortuous descending thoracic aorta. No pleural effusion or pneumothorax. Clear lungs.  IMPRESSION: No acute cardiopulmonary disease.   Electronically Signed   By: Jeronimo Greaves M.D.   On: 08/02/2013 18:12    EKG Interpretation     Ventricular Rate:  58 PR Interval:  212 QRS Duration: 82 QT Interval:  424 QTC Calculation: 416 R Axis:   97 Text Interpretation:  Sinus bradycardia with 1st degree A-V block Rightward axis Borderline ECG            MDM   1. Chest pain   2. DM (diabetes mellitus)   3. HTN (hypertension)    Well appearing in ED, no cp.   Story atypical for cp however pt does have multiple risk factors/ female.  She requested to go home after first troponin.  Discussed that she needs a stress test and with her risk factors Observation in hospital is indicated.  Pt prefers to fup with pcp and schedule, she understands this may be her heart which may lead to MI/ disability/ death and will return for recurrent chest pain. Family with pt in the room, we discussed improving risk factors.  She is willing to stay for second troponin. ASA in ED, pt  will continue outpt. EKG reviewed, no ischemic changes.   Results and differential diagnosis were discussed with the patient. Close follow up outpatient was discussed, patient comfortable with the plan.   Diagnosis: Chest pain, DM, HTN      Enid Skeens, MD  08/03/13 0206 

## 2013-08-02 NOTE — ED Notes (Signed)
Gave pt. Ginger ale and ice

## 2013-08-02 NOTE — Progress Notes (Signed)
  Subjective:    Patient ID: Nancy Myers, female    DOB: 06-17-1946, 67 y.o.   MRN: [id number]  HPI Comments: 67yo AAF presents with increased CP x 1 week, with +FMH MI in father. Notes CP radiates down Left arm and in to back, lasts for minutes, relieves on it's own, sharp, intense, breath taking pain.  MEDS ATORVASTATIN FLUOXETINE METFORMIN PREPRO METOPROLOL XANAX  PMH HTN HYPERCHOLESTEROLEMIA DM ANXIETY     Review of Systems  Cardiovascular: Positive for chest pain.       Objective:   Physical Exam  Nursing note and vitals reviewed. Cardiovascular: Normal rate, regular rhythm, normal heart sounds and intact distal pulses.   Pulmonary/Chest: Effort normal and breath sounds normal.     EKG NSCSPT     Assessment & Plan:  CP refer to ER refused EMS, for further eval

## 2013-08-02 NOTE — ED Notes (Signed)
Pt c/o intermittent CP x 1 week with radiation to left arm and through to back; pt denies SOB

## 2013-08-02 NOTE — ED Notes (Signed)
Pt undressed, in gown, on monitor, continuous pulse oximetry and blood pressure cuff; warm blanket given 

## 2013-08-03 ENCOUNTER — Telehealth: Payer: Self-pay | Admitting: *Deleted

## 2013-08-03 DIAGNOSIS — R079 Chest pain, unspecified: Secondary | ICD-10-CM

## 2013-08-03 NOTE — Telephone Encounter (Signed)
PT WAS RELEASED FROM THE ER. THEY ADVISED PT TO CALL us NEEDS A APPT WITH CARDIOLOGY FOR STRESS TEST.

## 2013-08-13 ENCOUNTER — Other Ambulatory Visit: Payer: Medicare Other

## 2013-08-13 ENCOUNTER — Encounter: Payer: Self-pay | Admitting: Internal Medicine

## 2013-08-13 ENCOUNTER — Ambulatory Visit (INDEPENDENT_AMBULATORY_CARE_PROVIDER_SITE_OTHER): Payer: Medicare Other | Admitting: Internal Medicine

## 2013-08-13 VITALS — BP 138/90 | HR 81 | Ht 60.0 in | Wt 157.8 lb

## 2013-08-13 DIAGNOSIS — R05 Cough: Secondary | ICD-10-CM

## 2013-08-13 DIAGNOSIS — R053 Chronic cough: Secondary | ICD-10-CM

## 2013-08-13 DIAGNOSIS — R059 Cough, unspecified: Secondary | ICD-10-CM

## 2013-08-13 MED ORDER — UMECLIDINIUM-VILANTEROL 62.5-25 MCG/INH IN AEPB: 1.0000 | INHALATION_SPRAY | Freq: Every day | RESPIRATORY_TRACT | Status: DC

## 2013-08-13 MED ORDER — BENZONATATE 200 MG PO CAPS: 200.0000 mg | ORAL_CAPSULE | Freq: Three times a day (TID) | ORAL | Status: DC | PRN

## 2013-08-13 NOTE — Patient Instructions (Signed)
Script sent for benzonatate perles for cough as needed  Sample Anoro inhaler    1 puff, one time daily  Order- lab- Allergy profile  Dx chronic cough  Order- Schedule PFT

## 2013-08-13 NOTE — Progress Notes (Signed)
08/13/13- 52 yoF former smoker referred courtesy of Dr McKeown-chronic cough/hoarseness  History of childhood asthma. Onset of cough around 1988 when she quit smoking. Told that she wheezes some. She denies any reflux or heartburn. Uses nebulizer once or twice a month but it makes her jittery and does not help the cough. Usually dry cough without shortness of breath. Triggers: Laughter, fragrances, cold air, lying down. Not food or drink. Helpful: Cough drops, sips of liquids. No benefit from Spiriva, albuterol, Advair. History sinusitis. Nasal surgery? Septoplasty. Denies reflux. 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 Lives alone. CXR 08/02/13- IMPRESSION:  No acute cardiopulmonary disease.  Electronically Signed  By: Jeronimo Greaves M.D.  On: 08/02/2013 18:12  Prior to Admission medications   Medication Sig Start Date End Date Taking? Authorizing Provider  aspirin 81 MG chewable tablet Chew 324 mg by mouth daily. Take 4 daily until seen by Oneta Rack in Dec.   Yes Historical Provider, MD  atorvastatin (LIPITOR) 40 MG tablet Take 40 mg by mouth daily.   Yes Historical Provider, MD  Cholecalciferol 5000 UNITS TABS Take 5,000-10,000 Units by mouth daily. Monday-Fridays =10,000iu Sat and Sun = 5,000iu   Yes Historical Provider, MD  estrogen, conjugated,-medroxyprogesterone (PREMPRO) 0.3-1.5 MG per tablet Take 1 tablet by mouth daily.   Yes Historical Provider, MD  FLUoxetine (PROZAC) 20 MG capsule Take 20 mg by mouth 2 (two) times daily.   Yes Historical Provider, MD  hydrochlorothiazide (HYDRODIURIL) 25 MG tablet Take 25 mg by mouth daily.   Yes Historical Provider, MD  losartan (COZAAR) 100 MG tablet Take 100 mg by mouth daily.   Yes Historical Provider, MD  Magnesium 500 MG TABS Take 500 mg by mouth daily.    Yes Historical Provider, MD  metFORMIN (GLUCOPHAGE) 500 MG tablet Take 500 mg by mouth 2 (two) times daily with a meal.    Yes Historical Provider, MD  metoprolol (TOPROL-XL) 200 MG 24  hr tablet Take 200 mg by mouth daily.   Yes Historical Provider, MD  Misc Natural Products (OSTEO BI-FLEX JOINT SHIELD PO) Take by mouth.   Yes Historical Provider, MD  benzonatate (TESSALON) 200 MG capsule Take 1 capsule (200 mg total) by mouth 3 (three) times daily as needed for cough. 08/13/13   Waymon Budge, MD  budesonide (PULMICORT) 0.5 MG/2ML nebulizer solution Take 0.5 mg by nebulization 2 (two) times daily.    Historical Provider, MD  carisoprodol (SOMA) 250 MG tablet Take 250 mg by mouth every 4 (four) hours as needed.    Historical Provider, MD  ipratropium-albuterol (DUONEB) 0.5-2.5 (3) MG/3ML SOLN Take 3 mLs by nebulization every 6 (six) hours as needed.    Historical Provider, MD  Umeclidinium-Vilanterol (ANORO ELLIPTA) 62.5-25 MCG/INH AEPB Inhale 1 puff into the lungs daily. 08/16/13   Waymon Budge, MD   Past Medical History  Diagnosis Date  . Hypertension   . Hyperlipidemia   . Diabetes mellitus without complication   . GERD (gastroesophageal reflux disease)   . Anxiety   . Depression   . Anemia   . IBS (irritable bowel syndrome)   . Vitamin D deficiency   . Cancer 1995    Colon Cancer  . Arrhythmia    Past Surgical History  Procedure Laterality Date  . Bunionectomy Bilateral 02/1987  . Carpal tunnel release Bilateral 09-30-01;10-21-01  . Anterior cruciate ligament repair Right 04-09-04    right knee  . Left knee  07-26-02  . Left foot  05-23-05  . Bunionectomy Right  03-08-08    right foot  . Cataract extraction w/ intraocular lens  implant, bilateral Bilateral 12-07-09;02-15-10   Family History  Problem Relation Age of Onset  . Heart disease Father     MI  . Stroke Mother   . Rheum arthritis Mother   . Heart attack Brother     half brother  . Heart attack Maternal Uncle     half maternal uncle   History   Social History  . Marital Status: Married    Spouse Name: N/A    Number of Children: N/A  . Years of Education: N/A   Occupational History  . 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 Smoking status: Former Smoker    Types: Cigarettes    Quit date: 09/23/1986  . Smokeless tobacco: Never Used  . Alcohol Use: No  . Drug Use: No  . Sexual Activity: Not on file   Other Topics Concern  . Not on file   Social History Narrative  . No narrative on file   ROS-see HPI Constitutional:   No-   weight loss, night sweats, fevers, chills, fatigue, lassitude. HEENT:   No-  headaches, difficulty swallowing, tooth/dental problems, sore throat,       No-  sneezing, itching, ear ache, nasal congestion, post nasal drip,  CV:  + chest pain, no-orthopnea, PND, swelling in lower extremities, anasarca, dizziness, palpitations Resp: No-   shortness of breath with exertion or at rest.              No-   productive cough,  + non-productive cough,  No- coughing up of blood.              No-   change in color of mucus.  No- wheezing.   Skin: No-   rash or lesions. GI:  No-   heartburn, indigestion, abdominal pain, nausea, vomiting, diarrhea,                 change in bowel habits, loss of appetite GU: No-   dysuria, change in color of urine, no urgency or frequency.  No- flank pain. MS:  No-   joint pain or swelling.  No- decreased range of motion.  No- back pain. Neuro-     nothing unusual Psych:  No- change in mood or affect. No depression or anxiety.  No memory loss.  OBJ- Physical Exam General- Alert, Oriented, Affect-appropriate, Distress- none acute Skin- rash-none, lesions- none, excoriation- none Lymphadenopathy- none Head- atraumatic            Eyes- Gross vision intact, PERRLA, conjunctivae and secretions clear            Ears- Hearing, canals-normal            Nose- Clear, no-Septal dev, mucus, polyps, erosion, perforation             Throat- Mallampati II , mucosa clear , drainage- none, tonsils- atrophic. + dentures Neck- flexible , trachea midline, no stridor , thyroid nl, carotid no bruit Chest -  symmetrical excursion , unlabored           Heart/CV- RRR , no murmur , no gallop  , no rub, nl s1 s2                           - JVD- none , edema- none, stasis changes- none, varices- none  Lung- clear to P&A, wheeze- none, cough+ paroxysmal dry cough with deep breath,                     dullness-none, rub- none           Chest wall-  Abd- tender-no, distended-no, bowel sounds-present, HSM- no Br/ Gen/ Rectal- Not done, not indicated Extrem- cyanosis- none, clubbing, none, atrophy- none, strength- nl Neuro- grossly intact to observation

## 2013-08-14 LAB — ALLERGY FULL PROFILE
Allergen,Goose feathers, e70: <0.1 kU/L
Alternaria Alternata: <0.1 kU/L
Bermuda Grass: <0.1 kU/L
Box Elder IgE: <0.1 kU/L
Cat Dander: <0.1 kU/L
Curvularia lunata: <0.1 kU/L
Dog Dander: <0.1 kU/L
Elm IgE: <0.1 kU/L
Fescue: <0.1 kU/L
G005 Rye, Perennial: <0.1 kU/L
Goldenrod: <0.1 kU/L
Helminthosporium halodes: <0.1 kU/L
IgE (Immunoglobulin E), Serum: 4.8 IU/mL (ref 0.0–180.0)
Lamb's Quarters: <0.1 kU/L
Plantain: <0.1 kU/L
Stemphylium Botryosum: <0.1 kU/L
Sycamore Tree: <0.1 kU/L

## 2013-08-16 ENCOUNTER — Ambulatory Visit (INDEPENDENT_AMBULATORY_CARE_PROVIDER_SITE_OTHER): Payer: Medicare Other | Admitting: Internal Medicine

## 2013-08-16 ENCOUNTER — Encounter: Payer: Self-pay | Admitting: Internal Medicine

## 2013-08-16 ENCOUNTER — Telehealth: Payer: Self-pay | Admitting: Internal Medicine

## 2013-08-16 VITALS — BP 168/102 | HR 65 | Ht 60.0 in | Wt 156.9 lb

## 2013-08-16 DIAGNOSIS — R011 Cardiac murmur, unspecified: Secondary | ICD-10-CM

## 2013-08-16 DIAGNOSIS — H53129 Transient visual loss, unspecified eye: Secondary | ICD-10-CM | POA: Diagnosis not present

## 2013-08-16 DIAGNOSIS — R059 Cough, unspecified: Secondary | ICD-10-CM

## 2013-08-16 DIAGNOSIS — H53123 Transient visual loss, bilateral: Secondary | ICD-10-CM

## 2013-08-16 DIAGNOSIS — R079 Chest pain, unspecified: Secondary | ICD-10-CM | POA: Diagnosis not present

## 2013-08-16 DIAGNOSIS — H5319 Other subjective visual disturbances: Secondary | ICD-10-CM

## 2013-08-16 DIAGNOSIS — R05 Cough: Secondary | ICD-10-CM

## 2013-08-16 DIAGNOSIS — H539 Unspecified visual disturbance: Secondary | ICD-10-CM | POA: Diagnosis not present

## 2013-08-16 DIAGNOSIS — E785 Hyperlipidemia, unspecified: Secondary | ICD-10-CM

## 2013-08-16 DIAGNOSIS — J45909 Unspecified asthma, uncomplicated: Secondary | ICD-10-CM

## 2013-08-16 DIAGNOSIS — I10 Essential (primary) hypertension: Secondary | ICD-10-CM

## 2013-08-16 HISTORY — DX: Transient visual loss, bilateral: H53.123

## 2013-08-16 HISTORY — DX: Other subjective visual disturbances: H53.19

## 2013-08-16 MED ORDER — UMECLIDINIUM-VILANTEROL 62.5-25 MCG/INH IN AEPB: 1.0000 | INHALATION_SPRAY | Freq: Every day | RESPIRATORY_TRACT | Status: DC

## 2013-08-16 NOTE — Telephone Encounter (Signed)
Notes Recorded by Waymon Budge, MD on 08/15/2013 at 4:38 PM Allergy antibody levels are not elevated for any of the common triggers on our panel.   I spoke with patient about results and she verbalized understanding and had no questions. She reports she feels like the anoro has helped and wants to know if she needs to continue this. She has 3 puffs left. Please advise Dr. Maple Hudson thanks

## 2013-08-16 NOTE — Patient Instructions (Addendum)
Your physician has requested that you have an echocardiogram. Echocardiography is a painless test that uses sound waves to create images of your heart. It provides your doctor with information about the size and shape of your heart and how well your heart's chambers and valves are working. This procedure takes approximately one hour. There are no restrictions for this procedure.  Your physician has requested that you have en exercise stress myoview. For further information please visit [email or web address] Please follow instruction sheet, as given. Bring an inhaler with you to your stress test.   Your physician recommends that you schedule a follow-up appointment in a few weeks, after your tests.

## 2013-08-16 NOTE — Telephone Encounter (Signed)
Ok to script for [employer] # 1, 1 puff once daily ref prn.  If insurance won't cover it will likely be too expensive.

## 2013-08-16 NOTE — Progress Notes (Signed)
OFFICE NOTE  Chief Complaint:  Chest pain, left arm heaviness  Primary Care Physician: Nancy Corwin, MD  HPI:  Nancy Myers this pleasant 67 year old female who was recently seen in the hospital for several episodes of left sided chest pain. 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 She developed left-sided chest heaviness that went to her arm and associated arm weakness for which she could not lift her arm above her shoulder. These episodes lasted for only a few seconds to a minute. She did not have any speech difficulty, facial droop, confusion or drooling associated with this. The chest heaviness does not radiate to her neck back or shoulder. Was described as a 8 or 9/10 in severity. She does have a family history significant for heart attack in her father age 53 and brother at age 73 and her mother had a stroke at age 7. Just as diabetes, hypertension which is not well controlled, dyslipidemia and asthma with recent exacerbations.  She reports finally another episode of chest discomfort that made her go to the hospital, and this was also precipitated by an uncle of her's who recently had sudden cardiac death secondary to an MI.  Also she reported about 40 years ago being told that she had a heart murmur but has not ever had an evaluation at that.  Just reports having had a plain exercise treadmill stress test 2 years ago at [facility] office, but never saw a cardiologist.  PMHx:  Past Medical History  Diagnosis Date  . Hypertension   . Hyperlipidemia   . Diabetes mellitus without complication   . GERD (gastroesophageal reflux disease)   . Anxiety   . Depression   . Anemia   . IBS (irritable bowel syndrome)   . Vitamin D deficiency   . Cancer 1995    Colon Cancer  . Arrhythmia     Past Surgical History  Procedure Laterality Date  . Bunionectomy Bilateral 02/1987  . Carpal tunnel release Bilateral 09-30-01;10-21-01  .  Anterior cruciate ligament repair Right 04-09-04    right knee  . Left knee  07-26-02  . Left foot  05-23-05  . Bunionectomy Right 03-08-08    right foot  . Cataract extraction w/ intraocular lens  implant, bilateral Bilateral 12-07-09;02-15-10    FAMHx:  Family History  Problem Relation Age of Onset  . Heart disease Father     MI  . Stroke Mother   . Rheum arthritis Mother   . Heart attack Brother     half brother  . Heart attack Maternal Uncle     half maternal uncle    SOCHx:   reports that she quit smoking about 26 years ago. Her smoking use included Cigarettes. She smoked 0.00 packs per day. She has never used smokeless tobacco. She reports that she does not drink alcohol or use illicit drugs.  ALLERGIES:  Allergies  Allergen Reactions  . Codeine Itching    ROS: A comprehensive review of systems was negative except for: Constitutional: positive for fatigue Respiratory: positive for asthma, cough and dyspnea on exertion Cardiovascular: positive for chest pain Neurological: positive for scintillating scotomas  HOME MEDS: Current Outpatient Prescriptions  Medication Sig Dispense Refill  . aspirin 81 MG chewable tablet Chew 324 mg by mouth daily. Take 4 daily until seen by Nancy Myers in Dec.      . atorvastatin (LIPITOR) 40 MG tablet Take 40 mg by mouth daily.      Marland Kitchen  benzonatate (TESSALON) 200 MG capsule Take 1 capsule (200 mg total) by mouth 3 (three) times daily as needed for cough.  30 capsule  1  . budesonide (PULMICORT) 0.5 MG/2ML nebulizer solution Take 0.5 mg by nebulization 2 (two) times daily.      . carisoprodol (SOMA) 250 MG tablet Take 250 mg by mouth every 4 (four) hours as needed.      . Cholecalciferol 5000 UNITS TABS Take 5,000-10,000 Units by mouth daily. Monday-Fridays =10,000iu Sat and Sun = 5,000iu      . estrogen, conjugated,-medroxyprogesterone (PREMPRO) 0.3-1.5 MG per tablet Take 1 tablet by mouth daily.      Marland Kitchen FLUoxetine (PROZAC) 20 MG capsule Take 20 mg  by mouth 2 (two) times daily.      . hydrochlorothiazide (HYDRODIURIL) 25 MG tablet Take 25 mg by mouth daily.      Marland Kitchen ipratropium-albuterol (DUONEB) 0.5-2.5 (3) MG/3ML SOLN Take 3 mLs by nebulization every 6 (six) hours as needed.      Marland Kitchen losartan (COZAAR) 100 MG tablet Take 100 mg by mouth daily.      . Magnesium 500 MG TABS Take 500 mg by mouth daily.       . metFORMIN (GLUCOPHAGE) 500 MG tablet Take 500 mg by mouth 2 (two) times daily with a meal.       . metoprolol (TOPROL-XL) 200 MG 24 hr tablet Take 200 mg by mouth daily.      . Misc Natural Products (OSTEO BI-FLEX JOINT SHIELD PO) Take by mouth.      . Umeclidinium-Vilanterol (ANORO ELLIPTA) 62.5-25 MCG/INH AEPB Inhale 1 puff into the lungs daily.  1 each  0   No current facility-administered medications for this visit.    LABS/IMAGING: No results found for this or any previous visit (from the past 48 hour(s)). No results found.  VITALS: BP 168/102  Pulse 65  Ht 5' (1.524 m)  Wt 156 lb 14.4 oz (71.169 kg)  BMI 30.64 kg/m2  EXAM: General appearance: alert and no distress Neck: no carotid bruit and no JVD Lungs: clear to auscultation bilaterally Heart: regular rate and rhythm, S1, S2 normal, 2/6 SEM at LLSB Abdomen: soft, non-tender; bowel sounds normal; no masses,  no organomegaly Extremities: extremities normal, atraumatic, no cyanosis or edema Pulses: 2+ and symmetric Skin: Skin color, texture, turgor normal. No rashes or lesions Neurologic: Grossly normal Psych: Mildly anxious  EKG: Sinus rhythm with first degree AV block and PACs at 65  ASSESSMENT: 1. Recurrent, progressive chest pain 2. Palpitations 3. Dyspnea on exertion and wheezing 4. Scintillating scotoma secondary to cough 5. Hypertension 6. Dyslipidemia 7. Diabetes type 2  PLAN: 1.   Nancy Myers has multiple coronary risk factors has had several episodes of chest pain which are coming more frequently. They are somewhat unusual in that the left arm  symptoms include heaviness in frank weakness of the arm with difficulty lifting. This could possibly be explained by cortical ischemia. She has had notable hypertension and could be having hypertensive urgency. However, given her risk factors and family history, ischemia should be excluded. Her treadmill stress test which was 2 years ago is unlikely to be predictive in value.  I would recommend an exercise nuclear stress test, and if she cannot complete this an attempt at a Lexus and. I've advised her use her inhalers specifically prior to the procedure to try to help avoid bronchoconstriction. She does have a brassy, wheezy cough with poor air flow movement. She said that she  been recently started on Pulmicort and I've encouraged her to continue to use that for the steroid benefit.  It seems that she has reactive airways and continued to have a progressive cough which was nearly incessant. During this episode of coughing while examining her, she had either a presyncopal or visual field abnormality. She reported seeing scintillations, but denied any associated headache. I suspect this could be do to either hypertension, increased cardiothoracic pressures, or perhaps a small amount of right-to-left shunting. Her exam is significant for a mitral murmur and she has had shortness of breath. I would like to obtain an echocardiogram to evaluate both of these and also a bubble study to rule out an atrial septal defect.  Plan to see her back in 2 weeks to discuss the results of her echocardiogram and stress test. I'll adjust her medications at that time if the blood pressure remains markedly elevated.  Chrystie Nose, MD, [facility] Attending Cardiologist [facility]  Camren Henthorn C 08/16/2013, 12:28 PM

## 2013-08-16 NOTE — Telephone Encounter (Signed)
Rx sent. Pt aware. Jennifer Castillo, CMA  

## 2013-08-25 ENCOUNTER — Ambulatory Visit (HOSPITAL_COMMUNITY)
Admission: RE | Admit: 2013-08-25 | Discharge: 2013-08-25 | Disposition: A | Discharge status: Home / Self Care | Payer: Medicare Other | Source: Ambulatory Visit | Attending: Cardiovascular Disease | Admitting: Cardiovascular Disease

## 2013-08-25 DIAGNOSIS — R079 Chest pain, unspecified: Secondary | ICD-10-CM | POA: Diagnosis not present

## 2013-08-25 DIAGNOSIS — J45909 Unspecified asthma, uncomplicated: Secondary | ICD-10-CM | POA: Diagnosis not present

## 2013-08-25 DIAGNOSIS — R0609 Other forms of dyspnea: Secondary | ICD-10-CM | POA: Diagnosis not present

## 2013-08-25 DIAGNOSIS — R0989 Other specified symptoms and signs involving the circulatory and respiratory systems: Secondary | ICD-10-CM | POA: Insufficient documentation

## 2013-08-25 DIAGNOSIS — R5381 Other malaise: Secondary | ICD-10-CM | POA: Insufficient documentation

## 2013-08-25 DIAGNOSIS — Z85038 Personal history of other malignant neoplasm of large intestine: Secondary | ICD-10-CM | POA: Insufficient documentation

## 2013-08-25 DIAGNOSIS — Z8249 Family history of ischemic heart disease and other diseases of the circulatory system: Secondary | ICD-10-CM | POA: Insufficient documentation

## 2013-08-25 DIAGNOSIS — Z87891 Personal history of nicotine dependence: Secondary | ICD-10-CM | POA: Diagnosis not present

## 2013-08-25 DIAGNOSIS — I10 Essential (primary) hypertension: Secondary | ICD-10-CM | POA: Diagnosis not present

## 2013-08-25 DIAGNOSIS — E119 Type 2 diabetes mellitus without complications: Secondary | ICD-10-CM | POA: Diagnosis not present

## 2013-08-25 MED ORDER — REGADENOSON 0.4 MG/5ML IV SOLN
0.4000 mg | Freq: Once | INTRAVENOUS | Status: AC
  Administered 2013-08-25: 0.4 mg via INTRAVENOUS

## 2013-08-25 MED ORDER — TECHNETIUM TC 99M SESTAMIBI GENERIC - CARDIOLITE
10.6000 | Freq: Once | INTRAVENOUS | Status: AC | PRN
  Administered 2013-08-25: 11 via INTRAVENOUS

## 2013-08-25 MED ORDER — TECHNETIUM TC 99M SESTAMIBI GENERIC - CARDIOLITE
30.7000 | Freq: Once | INTRAVENOUS | Status: AC | PRN
  Administered 2013-08-25: 30.7 via INTRAVENOUS

## 2013-08-25 MED ORDER — AMINOPHYLLINE 25 MG/ML IV SOLN
125.0000 mg | Freq: Once | INTRAVENOUS | Status: AC
  Administered 2013-08-25: 125 mg via INTRAVENOUS

## 2013-08-25 NOTE — Procedures (Addendum)
[facility] [address] [place] [address] [phone]  Cardiology Nuclear Med Study  OLUWANIFEMI PETITTI is a 67 y.o. female     MRN : [id number]     DOB: 1946-01-12  Procedure Date: 08/25/2013  Nuclear Med Background Indication for Stress Test:  Evaluation for Ischemia and Abnormal EKG History:  Asthma and parasthesia;colon ca--1995 Cardiac Risk Factors: Family History - CAD, History of Smoking, Hypertension, Lipids, NIDDM and Overweight  Symptoms:  Chest Pain, DOE, Fatigue, SOB and L arm heaviness   Nuclear Pre-Procedure Caffeine/Decaff Intake:  1:00am NPO After: 11am   IV Site: R Hand  IV 0.9% NS with Angio Cath:  22g  Chest Size (in):  n/a IV Started by: Emmit Pomfret, RN  Height: 5' (1.524 m)  Cup Size: D  BMI:  Body mass index is 30.47 kg/(m^2). Weight:  156 lb (70.761 kg)   Tech Comments:  Changed to [employer] d/t inability to achieve target HR of 85%    Nuclear Med Study 1 or 2 day study: 1 day  Stress Test Type:  Stress  Order Authorizing Provider:  Zoila Shutter, MD   Resting Radionuclide: Technetium 32m Sestamibi  Resting Radionuclide Dose: 10.6 mCi   Stress Radionuclide:  Technetium 90m Sestamibi  Stress Radionuclide Dose: 30.7 mCi           Stress Protocol Rest HR: 75 Stress HR: 97  Rest BP: 151/88 Stress BP: 133/83  Exercise Time (min): n/a METS: n/a   Predicted Max HR: 153 bpm % Max HR: 63.4 bpm Rate Pressure Product: 21308  Dose of Adenosine (mg):  n/a Dose of Lexiscan: 0.4 mg  Dose of Atropine (mg): n/a Dose of Dobutamine: n/a mcg/kg/min (at max HR)  Stress Test Technologist: Esperanza Sheets, CCT Nuclear Technologist: Gonzella Lex, CNMT   Rest Procedure:  Myocardial perfusion imaging was performed at rest 45 minutes following the intravenous administration of Technetium 62m Sestamibi. Stress Procedure:  The patient received IV Lexiscan 0.4 mg over 15-seconds.  Technetium 34m Sestamibi  injected at 30-seconds.  The patient experienced SOB; 125 mg of IV Aminophylline was administered with resolution of symptoms.  There were no significant changes with Lexiscan.  Quantitative spect images were obtained after a 45 minute delay.  Transient Ischemic Dilatation (Normal <1.22):  0.81 Lung/Heart Ratio (Normal <0.45):  0.26 QGS EDV:  57 ml QGS ESV:  15 ml LV Ejection Fraction: 74%  Signed by     Rest ECG: NSR - Normal EKG  Stress ECG: No significant change from baseline ECG  QPS Raw Data Images:  Normal; no motion artifact; normal heart/lung ratio. Stress Images:  Normal homogeneous uptake in all areas of the myocardium. Rest Images:  Normal homogeneous uptake in all areas of the myocardium. Subtraction (SDS):  Normal  Impression Exercise Capacity:  Patient initially exercised on the Bruce Protocol for 6 min 15 sec; changed to lexiscan protocol.due to inability to keep up with treadmill. BP Response:  Normal blood pressure response. Clinical Symptoms:  Mild shortness of breath ECG Impression:  No significant ST segment change suggestive of ischemia. Comparison with Prior Nuclear Study: No previous nuclear study performed  Overall Impression:  Normal stress nuclear study.  LV Wall Motion:  NL LV Function, 74%; NL Wall Motion   Lennette Bihari, MD  08/25/2013 5:38 PM

## 2013-08-29 NOTE — Assessment & Plan Note (Signed)
Chronic cough with a background history of asthma. Consider possible role of metoprolol. This is probably an upper airway/nerve sensitivity cough syndrome, or cough- equivalent asthma Plan-benzonatate, allergy profile, schedule PFT, sample Anoro inhaler

## 2013-09-01 ENCOUNTER — Ambulatory Visit (HOSPITAL_COMMUNITY)
Admission: RE | Admit: 2013-09-01 | Discharge: 2013-09-01 | Disposition: A | Discharge status: Home / Self Care | Payer: Medicare Other | Source: Ambulatory Visit | Attending: Cardiovascular Disease | Admitting: Cardiovascular Disease

## 2013-09-01 DIAGNOSIS — E785 Hyperlipidemia, unspecified: Secondary | ICD-10-CM | POA: Insufficient documentation

## 2013-09-01 DIAGNOSIS — R011 Cardiac murmur, unspecified: Secondary | ICD-10-CM | POA: Diagnosis not present

## 2013-09-01 DIAGNOSIS — H539 Unspecified visual disturbance: Secondary | ICD-10-CM

## 2013-09-01 DIAGNOSIS — R079 Chest pain, unspecified: Secondary | ICD-10-CM | POA: Diagnosis not present

## 2013-09-01 NOTE — Progress Notes (Signed)
2D Echo Performed 09/01/2013    Velina Drollinger, RCS  

## 2013-09-02 ENCOUNTER — Encounter: Payer: Self-pay | Admitting: Physician Assistant

## 2013-09-03 ENCOUNTER — Encounter: Payer: Self-pay | Admitting: Physician Assistant

## 2013-09-03 ENCOUNTER — Ambulatory Visit (INDEPENDENT_AMBULATORY_CARE_PROVIDER_SITE_OTHER): Payer: Medicare Other | Admitting: Physician Assistant

## 2013-09-03 VITALS — BP 132/78 | HR 60 | Temp 98.1°F | Resp 16 | Ht 60.0 in | Wt 158.0 lb

## 2013-09-03 DIAGNOSIS — E782 Mixed hyperlipidemia: Secondary | ICD-10-CM

## 2013-09-03 DIAGNOSIS — E119 Type 2 diabetes mellitus without complications: Secondary | ICD-10-CM | POA: Diagnosis not present

## 2013-09-03 DIAGNOSIS — R7309 Other abnormal glucose: Secondary | ICD-10-CM

## 2013-09-03 DIAGNOSIS — E785 Hyperlipidemia, unspecified: Secondary | ICD-10-CM | POA: Diagnosis not present

## 2013-09-03 DIAGNOSIS — I10 Essential (primary) hypertension: Secondary | ICD-10-CM | POA: Diagnosis not present

## 2013-09-03 DIAGNOSIS — Z1212 Encounter for screening for malignant neoplasm of rectum: Secondary | ICD-10-CM

## 2013-09-03 DIAGNOSIS — E559 Vitamin D deficiency, unspecified: Secondary | ICD-10-CM

## 2013-09-03 DIAGNOSIS — Z79899 Other long term (current) drug therapy: Secondary | ICD-10-CM | POA: Diagnosis not present

## 2013-09-03 LAB — HEPATIC FUNCTION PANEL
ALT: 15 U/L (ref 0–35)
Albumin: 4.1 g/dL (ref 3.5–5.2)
Alkaline Phosphatase: 89 U/L (ref 39–117)
Indirect Bilirubin: 0.5 mg/dL (ref 0.0–0.9)
Total Protein: 7.2 g/dL (ref 6.0–8.3)

## 2013-09-03 LAB — CBC WITH DIFFERENTIAL/PLATELET
Basophils Absolute: 0 10*3/uL (ref 0.0–0.1)
Basophils Relative: 0 % (ref 0–1)
Eosinophils Relative: 3 % (ref 0–5)
HCT: 37 % (ref 36.0–46.0)
Hemoglobin: 12.5 g/dL (ref 12.0–15.0)
Lymphocytes Relative: 42 % (ref 12–46)
MCHC: 33.8 g/dL (ref 30.0–36.0)
MCV: 80.1 fL (ref 78.0–100.0)
Monocytes Absolute: 0.5 10*3/uL (ref 0.1–1.0)
RDW: 14.8 % (ref 11.5–15.5)
WBC: 5.8 10*3/uL (ref 4.0–10.5)

## 2013-09-03 LAB — BASIC METABOLIC PANEL WITH GFR
BUN: 10 mg/dL (ref 6–23)
Chloride: 100 mEq/L (ref 96–112)
Creat: 0.7 mg/dL (ref 0.50–1.10)
GFR, Est Non African American: >89 mL/min

## 2013-09-03 LAB — TSH: TSH: 1.237 u[IU]/mL (ref 0.350–4.500)

## 2013-09-03 LAB — HEMOGLOBIN A1C
Hgb A1c MFr Bld: 7 % — ABNORMAL HIGH (ref <5.7)
Mean Plasma Glucose: 154 mg/dL — ABNORMAL HIGH (ref <117)

## 2013-09-03 LAB — LIPID PANEL
HDL: 47 mg/dL (ref >39)
VLDL: 25 mg/dL (ref 0–40)

## 2013-09-03 NOTE — Patient Instructions (Signed)
Sleep Apnea  Sleep apnea is a sleep disorder characterized by abnormal pauses in breathing while you sleep. When your breathing pauses, the level of oxygen in your blood decreases. This causes you to move out of deep sleep and into light sleep. As a result, your quality of sleep is poor, and the system that carries your blood throughout your body (cardiovascular system) experiences stress. If sleep apnea remains untreated, the following conditions can develop:  High blood pressure (hypertension).  Coronary artery disease.  Inability to achieve or maintain an erection (impotence).  Impairment of your thought process (cognitive dysfunction). There are three types of sleep apnea: 1. Obstructive sleep apnea Pauses in breathing during sleep because of a blocked airway. 2. Central sleep apnea Pauses in breathing during sleep because the area of the brain that controls your breathing does not send the correct signals to the muscles that control breathing. 3. Mixed sleep apnea A combination of both obstructive and central sleep apnea. RISK FACTORS The following risk factors can increase your risk of developing sleep apnea:  Being overweight.  Smoking.  Having narrow passages in your nose and throat.  Being of older age.  Being female.  Alcohol use.  Sedative and tranquilizer use.  Ethnicity. Among individuals younger than 35 years, African Americans are at increased risk of sleep apnea. SYMPTOMS   Difficulty staying asleep.  Daytime sleepiness and fatigue.  Loss of energy.  Irritability.  Loud, heavy snoring.  Morning headaches.  Trouble concentrating.  Forgetfulness.  Decreased interest in sex. DIAGNOSIS  In order to diagnose sleep apnea, your caregiver will perform a physical examination. Your caregiver may suggest that you take a home sleep test. Your caregiver may also recommend that you spend the night in a sleep lab. In the sleep lab, several monitors record  information about your heart, lungs, and brain while you sleep. Your leg and arm movements and blood oxygen level are also recorded. TREATMENT The following actions may help to resolve mild sleep apnea:  Sleeping on your side.   Using a decongestant if you have nasal congestion.   Avoiding the use of depressants, including alcohol, sedatives, and narcotics.   Losing weight and modifying your diet if you are overweight. There also are devices and treatments to help open your airway:  Oral appliances. These are custom-made mouthpieces that shift your lower jaw forward and slightly open your bite. This opens your airway.  Devices that create positive airway pressure. This positive pressure "splints" your airway open to help you breathe better during sleep. The following devices create positive airway pressure:  Continuous positive airway pressure (CPAP) device. The CPAP device creates a continuous level of air pressure with an air pump. The air is delivered to your airway through a mask while you sleep. This continuous pressure keeps your airway open.  Nasal expiratory positive airway pressure (EPAP) device. The EPAP device creates positive air pressure as you exhale. The device consists of single-use valves, which are inserted into each nostril and held in place by adhesive. The valves create very little resistance when you inhale but create much more resistance when you exhale. That increased resistance creates the positive airway pressure. This positive pressure while you exhale keeps your airway open, making it easier to breath when you inhale again.  Bilevel positive airway pressure (BPAP) device. The BPAP device is used mainly in patients with central sleep apnea. This device is similar to the CPAP device because it also uses an air pump to deliver  continuous air pressure through a mask. However, with the BPAP machine, the pressure is set at two different levels. The pressure when you  exhale is lower than the pressure when you inhale.  Surgery. Typically, surgery is only done if you cannot comply with less invasive treatments or if the less invasive treatments do not improve your condition. Surgery involves removing excess tissue in your airway to create a wider passage way. Document Released: 08/30/2002 Document Revised: 01/04/2013 Document Reviewed: 01/16/2012 [facility] Patient Information 2014 [place], [place].    Bad carbs also include fruit juice, alcohol, and sweet tea. These are empty calories that do not signal to your brain that you are full.   Please remember the good carbs are still carbs which convert into sugar. So please measure them out no more than 1/2-1 cup of rice, oatmeal, pasta, and beans.  Veggies are however free foods! Pile them on.   I like lean protein at every meal such as chicken, [place], pork chops, cottage cheese, etc. Just do not fry these meats and please center your meal around vegetable, the meats should be a side dish.   No all fruit is created equal. Please see the list below, the fruit at the bottom is higher in sugars than the fruit at the top   We want weight loss that will last so you should lose 1-2 pounds a week.  THAT IS IT! Please pick THREE things a month to change. Once it is a habit check off the item. Then pick another three items off the list to become habits.  If you are already doing a habit on the list GREAT!  Cross that item off! o Don't drink your calories. Ie, alcohol, soda, fruit juice, and sweet tea.  o Drink more water. Drink a glass when you feel hungry or before each meal.  o Eat breakfast - Complex carb and protein (likeDannon light and fit yogurt, oatmeal, fruit, eggs, [place] bacon). o Measure your cereal.  Eat no more than one cup a day. (ie [place]) o Eat an apple a day. o Add a vegetable a day. o Try a new vegetable a month. o Use Pam! Stop using oil or butter to cook. o Don't finish your plate or use smaller  plates. o Share your dessert. o Eat sugar free Jello for dessert or frozen grapes. o Don't eat 2-3 hours before bed. o Switch to whole wheat bread, pasta, and brown rice. o Make healthier choices when you eat out. No fries! o Pick baked chicken, NOT fried. o Don't forget to SLOW DOWN when you eat. It is not going anywhere.  o Take the stairs. o Park far away in the parking lot o [employer] (or weights) for 10 minutes while watching TV. o Walk at work for 10 minutes during break. o Walk outside 1 time a week with your friend, kids, dog, or significant other. o Start a walking group at church. o Walk the mall as much as you can tolerate.  o Keep a food diary. o Weigh yourself daily. o Walk for 15 minutes 3 days per week. o Cook at home more often and eat out less.  If life happens and you go back to old habits, it is okay.  Just start over. You can do it!   If you experience chest pain, get short of breath, or tired during the exercise, please stop immediately and inform your doctor.

## 2013-09-03 NOTE — Progress Notes (Signed)
Complete Physical HPI Patient presents for complete physical.   Patient's blood pressure has been controlled at home. Patient denies chest pain, shortness of breath, dizziness. BP: 132/78 mmHg  Patient's cholesterol is diet controlled. They are on Lipitor and denies myalgias. The patient's cholesterol last visit was LDL 99. The patient has been working on diet and exercise for prediabetes, denies changes in vision, polys, and paresthesias. Last A1C in office was 7.1 SOB with exertion and coughing, had recent neg stress cardiolite.  States she had diarrhea for 2-3 days last week but she is better now.  She states occasionally she is more fatigued, + snoring at night, but she has not been taking her B12.    Current Medications:  Current Outpatient Prescriptions on File Prior to Visit  Medication Sig Dispense Refill  . aspirin 81 MG chewable tablet Chew 324 mg by mouth daily. Take 4 daily until seen by [facility] in Dec.      . atorvastatin (LIPITOR) 40 MG tablet Take 40 mg by mouth daily.      . benzonatate (TESSALON) 200 MG capsule Take 1 capsule (200 mg total) by mouth 3 (three) times daily as needed for cough.  30 capsule  1  . budesonide (PULMICORT) 0.5 MG/2ML nebulizer solution Take 0.5 mg by nebulization 2 (two) times daily.      . carisoprodol (SOMA) 250 MG tablet Take 250 mg by mouth every 4 (four) hours as needed.      . Cholecalciferol 5000 UNITS TABS Take 5,000-10,000 Units by mouth daily. Monday-Fridays =10,000iu Sat and Sun = 5,000iu      . estrogen, conjugated,-medroxyprogesterone (PREMPRO) 0.3-1.5 MG per tablet Take 1 tablet by mouth daily.      Marland Kitchen FLUoxetine (PROZAC) 20 MG capsule Take 20 mg by mouth 2 (two) times daily.      . hydrochlorothiazide (HYDRODIURIL) 25 MG tablet Take 25 mg by mouth daily.      Marland Kitchen ipratropium-albuterol (DUONEB) 0.5-2.5 (3) MG/3ML SOLN Take 3 mLs by nebulization every 6 (six) hours as needed.      Marland Kitchen losartan (COZAAR) 100 MG tablet Take 100 mg by mouth  daily.      . Magnesium 500 MG TABS Take 500 mg by mouth daily.       . metFORMIN (GLUCOPHAGE) 500 MG tablet Take 500 mg by mouth 2 (two) times daily with a meal.       . metoprolol (TOPROL-XL) 200 MG 24 hr tablet Take 200 mg by mouth daily.      . Misc Natural Products (OSTEO BI-FLEX JOINT SHIELD PO) Take by mouth.      . Umeclidinium-Vilanterol (ANORO ELLIPTA) 62.5-25 MCG/INH AEPB Inhale 1 puff into the lungs daily.  30 each  11   No current facility-administered medications on file prior to visit.   Health Maintenance:  Tetanus: 2012 Pneumovax:2013 Flu vaccine: 2014 Zostavax: Pap: 2010 MGM: 05/2013 neg DEXA: N/A Colonoscopy: 2013 Due 5 years Dr. Randa Evens EGD: N/A Allergies:  Allergies  Allergen Reactions  . Codeine Itching   Medical History:  Past Medical History  Diagnosis Date  . Hypertension   . Hyperlipidemia   . Diabetes mellitus without complication   . GERD (gastroesophageal reflux disease)   . Anxiety   . Depression   . Anemia   . IBS (irritable bowel syndrome)   . Vitamin D deficiency   . Cancer 1995    Colon Cancer  . Arrhythmia    Surgical History:  Past Surgical History  Procedure Laterality  Date  . Bunionectomy Bilateral 02/1987  . Carpal tunnel release Bilateral 09-30-01;10-21-01  . Anterior cruciate ligament repair Right 04-09-04    right knee  . Left knee  07-26-02  . Left foot  05-23-05  . Bunionectomy Right 03-08-08    right foot  . Cataract extraction w/ intraocular lens  implant, bilateral Bilateral 12-07-09;02-15-10   Family History:  Family History  Problem Relation Age of Onset  . Heart disease Father     MI  . Stroke Mother   . Rheum arthritis Mother   . Heart attack Brother     half brother  . Heart attack Maternal Uncle     half maternal uncle   Social History:  History   Social History  . Marital Status: Married    Spouse Name: N/A    Number of Children: N/A  . Years of Education: N/A   Occupational History  . 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 Smoking status: Former Smoker    Types: Cigarettes    Quit date: 09/23/1986  . Smokeless tobacco: Never Used  . Alcohol Use: No  . Drug Use: No  . Sexual Activity: Not on file   Other Topics Concern  . Not on file   Social History Narrative  . No narrative on file   ROS Constitutional: Denies weight loss/gain, headaches, insomnia, fatigue, night sweats, and change in appetite. Eyes: DEE due Monday (Dr. Barbie Haggis) Denies redness, blurred vision, diplopia, discharge, itchy, watery eyes.  ENT: Denies discharge, congestion, post nasal drip, sore throat, earache, hearing loss, dental pain, Tinnitus, Vertigo, Sinus pain, snoring.  Cardio: (Dr. Rennis Golden) Normal echo and Cardiolite EF 60% for SOB with exertion Denies chest pain, palpitations, irregular heartbeat, dyspnea, diaphoresis, orthopnea, PND, claudication, edema Respiratory: denies cough, dyspnea, pleurisy, hoarseness, wheezing.  Gastrointestinal: (Dr. Randa Evens) Denies dysphagia, heartburn, pain, cramps, nausea, vomiting, bloating, diarrhea, constipation, hematemesis, melena, hematochezia, hemorrhoids Genitourinary: Denies dysuria, frequency, urgency, nocturia, hesitancy, discharge, hematuria, flank pain Breast:Denies Breast lumps, nipple discharge, bleeding.  Musculoskeletal: Denies arthralgia, myalgia, stiffness, Jt. Swelling, pain, Skin: Denies pruritis, rash, hives,  acne, eczema, changing in skin lesion Neuro: Denies Weakness, tremor, incoordination, spasms, paresthesia, pain Psychiatric: Denies confusion, memory loss, sensory loss Endocrine: Denies change in weight, skin, hair change, nocturia, and paresthesia, Diabetic Denies Polys, visual blurring, hyper /hypo glycemic episodes.  Heme/Lymph: Denies Excessive bleeding, bruising, enlarged lymph nodes  Physical Exam: Estimated body mass index is 30.86 kg/(m^2) as calculated from the following:   Height as of this  encounter: 5' (1.524 m).   Weight as of this encounter: 158 lb (71.668 kg). Filed Vitals:   09/03/13 0945  BP: 132/78  Pulse: 60  Temp: 98.1 F (36.7 C)  Resp: 16   General Appearance: Well nourished, in no apparent distress. Eyes: PERRLA, EOMs, conjunctiva no swelling or erythema, normal fundi and vessels. Sinuses: No Frontal/maxillary tenderness ENT/Mouth: Ext aud canals clear, normal light reflex with TMs without erythema, bulging.  Good dentition. No erythema, swelling, or exudate on post pharynx. Tonsils not swollen or erythematous. Hearing normal.  Neck: Supple, thyroid normal. No bruits Respiratory: Respiratory effort normal, BS equal bilaterally without rales, rhonchi, wheezing or stridor. Cardio: Heart sounds normal, regular rate and rhythm without murmurs, rubs or gallops. Peripheral pulses brisk and equal bilaterally, without edema.  Chest: symmetric, with normal excursions and percussion. Breasts: defer Abdomen: Obese, soft, with bowl sounds. Non tender, no guarding, rebound, hernias, masses, or organomegaly. .  Lymphatics: Non tender without  lymphadenopathy.  Genitourinary: defer Musculoskeletal: Full ROM all peripheral extremities,5/5 strength, and normal gait. Skin: Warm, dry without rashes, lesions, ecchymosis.  Neuro: Cranial nerves intact, reflexes equal bilaterally. Normal muscle tone, no cerebellar symptoms. Sensation intact.  Psych: Awake and oriented X 3, normal affect, Insight and Judgment appropriate.   EKG: Had recent extensive work up.   Assessment and Plan: Hypertension- at goal, continue diet and exercise  Hyperlipidemia- continue diet and exercise, check lipids  Diabetes mellitus without complication- continue diet and exercise- check A1C  GERD (gastroesophageal reflux disease)- try OTC prilosec  Anxiety- controlled  Depression- controlled/in remission  Anemia- check CBC  IBS (irritable bowel syndrome)- controlled with diet  Vitamin D deficiency-  check level  Cancer, colon- continue colonoscopies   Obesity- admits to eating at night- long discussion diet and exercise.  SOB with exertion- likely from her lungs/deconditioning, negative stress test- patient counseled on cardio a week for heart heatlh according to [facility].    Quentin Mulling 10:02 AM

## 2013-09-04 LAB — URINALYSIS, ROUTINE W REFLEX MICROSCOPIC
Bilirubin Urine: NEGATIVE
Hgb urine dipstick: NEGATIVE
Leukocytes, UA: NEGATIVE
Nitrite: NEGATIVE
Protein, ur: NEGATIVE mg/dL
Specific Gravity, Urine: 1.013 (ref 1.005–1.030)
Urobilinogen, UA: 0.2 mg/dL (ref 0.0–1.0)
pH: 7 (ref 5.0–8.0)

## 2013-09-04 LAB — MICROALBUMIN / CREATININE URINE RATIO: Creatinine, Urine: 115.1 mg/dL

## 2013-09-04 LAB — INSULIN, FASTING: Insulin fasting, serum: 14 u[IU]/mL (ref 3–28)

## 2013-09-06 ENCOUNTER — Encounter: Payer: Self-pay | Admitting: Internal Medicine

## 2013-09-06 ENCOUNTER — Ambulatory Visit (INDEPENDENT_AMBULATORY_CARE_PROVIDER_SITE_OTHER): Payer: Medicare Other | Admitting: Internal Medicine

## 2013-09-06 VITALS — BP 110/6 | HR 84 | Ht 65.0 in | Wt 158.4 lb

## 2013-09-06 DIAGNOSIS — R079 Chest pain, unspecified: Secondary | ICD-10-CM | POA: Diagnosis not present

## 2013-09-06 DIAGNOSIS — H53123 Transient visual loss, bilateral: Secondary | ICD-10-CM

## 2013-09-06 DIAGNOSIS — I10 Essential (primary) hypertension: Secondary | ICD-10-CM

## 2013-09-06 DIAGNOSIS — H5319 Other subjective visual disturbances: Secondary | ICD-10-CM

## 2013-09-06 DIAGNOSIS — E785 Hyperlipidemia, unspecified: Secondary | ICD-10-CM

## 2013-09-06 DIAGNOSIS — H53129 Transient visual loss, unspecified eye: Secondary | ICD-10-CM

## 2013-09-06 NOTE — Progress Notes (Signed)
OFFICE NOTE  Chief Complaint:  Chest pain, left arm heaviness  Primary Care Physician: Nadean Corwin, MD  HPI:  Nancy Myers this pleasant 67 year old female who was recently seen in the hospital for several episodes of left sided chest pain. 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 She developed left-sided chest heaviness that went to her arm and associated arm weakness for which she could not lift her arm above her shoulder. These episodes lasted for only a few seconds to a minute. She did not have any speech difficulty, facial droop, confusion or drooling associated with this. The chest heaviness does not radiate to her neck back or shoulder. Was described as a 8 or 9/10 in severity. She does have a family history significant for heart attack in her father age 26 and brother at age 54 and her mother had a stroke at age 26. Just as diabetes, hypertension which is not well controlled, dyslipidemia and asthma with recent exacerbations.  She reports finally another episode of chest discomfort that made her go to the hospital, and this was also precipitated by an uncle of her's who recently had sudden cardiac death secondary to an MI.  Also she reported about 40 years ago being told that she had a heart murmur but has not ever had an evaluation at that.  Just reports having had a plain exercise treadmill stress test 2 years ago at [employer] office, but never saw a cardiologist.  At her last office visit I recommended checking a stress test and an echocardiogram. She underwent both of those in our office. The echocardiogram showed a preserved EF of 55-60%, with mild aortic insufficiency and mild mitral regurgitation. Her stress test was normal, demonstrating no evidence of ischemia and an EF of 74%. She denies any further scintillations, however she is having some visual difficulty and is scheduled to see her ophthalmologist this week.  PMHx:   Past Medical History  Diagnosis Date  . Hypertension   . Hyperlipidemia   . Diabetes mellitus without complication   . GERD (gastroesophageal reflux disease)   . Anxiety   . Depression   . Anemia   . IBS (irritable bowel syndrome)   . Vitamin D deficiency   . Cancer 1995    Colon Cancer  . Arrhythmia     Past Surgical History  Procedure Laterality Date  . Bunionectomy Bilateral 02/1987  . Carpal tunnel release Bilateral 09-30-01;10-21-01  . Anterior cruciate ligament repair Right 04-09-04    right knee  . Left knee  07-26-02  . Left foot  05-23-05  . Bunionectomy Right 03-08-08    right foot  . Cataract extraction w/ intraocular lens  implant, bilateral Bilateral 12-07-09;02-15-10    FAMHx:  Family History  Problem Relation Age of Onset  . Heart disease Father     MI  . Stroke Mother   . Rheum arthritis Mother   . Heart attack Brother     half brother  . Heart attack Maternal Uncle     half maternal uncle    SOCHx:   reports that she quit smoking about 26 years ago. Her smoking use included Cigarettes. She smoked 0.00 packs per day. She has never used smokeless tobacco. She reports that she does not drink alcohol or use illicit drugs.  ALLERGIES:  Allergies  Allergen Reactions  . Codeine Itching    ROS: A comprehensive review of systems was negative except  for: Constitutional: positive for fatigue Neurological: positive for scintillating scotomas  HOME MEDS: Current Outpatient Prescriptions  Medication Sig Dispense Refill  . aspirin 81 MG chewable tablet Chew 324 mg by mouth daily. Take 4 daily until seen by [facility] in Dec.      . atorvastatin (LIPITOR) 40 MG tablet Take 40 mg by mouth daily.      . benzonatate (TESSALON) 200 MG capsule Take 1 capsule (200 mg total) by mouth 3 (three) times daily as needed for cough.  30 capsule  1  . budesonide (PULMICORT) 0.5 MG/2ML nebulizer solution Take 0.5 mg by nebulization 2 (two) times daily.      . carisoprodol (SOMA)  250 MG tablet Take 250 mg by mouth every 4 (four) hours as needed.      . Cholecalciferol 5000 UNITS TABS Take 5,000-10,000 Units by mouth daily. Monday-Fridays =10,000iu Sat and Sun = 5,000iu      . estrogen, conjugated,-medroxyprogesterone (PREMPRO) 0.3-1.5 MG per tablet Take 1 tablet by mouth daily.      Marland Kitchen FLUoxetine (PROZAC) 20 MG capsule Take 20 mg by mouth 2 (two) times daily.      . hydrochlorothiazide (HYDRODIURIL) 25 MG tablet Take 25 mg by mouth daily.      Marland Kitchen ipratropium-albuterol (DUONEB) 0.5-2.5 (3) MG/3ML SOLN Take 3 mLs by nebulization every 6 (six) hours as needed.      Marland Kitchen losartan (COZAAR) 100 MG tablet Take 100 mg by mouth daily.      . Magnesium 500 MG TABS Take 500 mg by mouth daily.       . metFORMIN (GLUCOPHAGE) 500 MG tablet Take 500 mg by mouth 2 (two) times daily with a meal.       . metoprolol (TOPROL-XL) 200 MG 24 hr tablet Take 200 mg by mouth daily.      . Misc Natural Products (OSTEO BI-FLEX JOINT SHIELD PO) Take by mouth.      . Umeclidinium-Vilanterol (ANORO ELLIPTA) 62.5-25 MCG/INH AEPB Inhale 1 puff into the lungs daily.  30 each  11   No current facility-administered medications for this visit.    LABS/IMAGING: No results found for this or any previous visit (from the past 48 hour(s)). No results found.  VITALS: BP 110/6  Pulse 84  Ht 5\' 5"  (1.651 m)  Wt 158 lb 6.4 oz (71.85 kg)  BMI 26.36 kg/m2  EXAM: deferred  EKG: deferred  ASSESSMENT: 1. Chest pain - negative NST for ischemia (08/25/2013) 2. Palpitations 3. Dyspnea on exertion and wheezing - mild AI and MR (09/01/2013) 4. Scintillating scotoma secondary to cough 5. Hypertension 6. Dyslipidemia 7. Diabetes type 2  PLAN: 1.   Nancy Myers is feeling better. Her stress test was negative for ischemia and showed a preserved ejection fraction. Her echo also confirmed this and showed only mild AI and mild MR. She's had no further visual scintillations, but is having some difficulty with her  vision which may be related to cataracts. She is scheduled to see an ophthalmologist this week. Have encouraged her to start a exercise routine again and work up slowly. This should be very helpful for her shortness of breath and overall cardiovascular fitness. Plan to see her back annually and we will need to follow her valves for any significant changes.   Chrystie Nose, MD, [facility] Attending Cardiologist [facility]  Bayden Gil C 09/06/2013, 2:38 PM

## 2013-09-06 NOTE — Patient Instructions (Signed)
Your physician wants you to follow-up in: 1 year. You will receive a reminder letter in the mail two months in advance. If you don't receive a letter, please call our office to schedule the follow-up appointment.  

## 2013-09-08 DIAGNOSIS — Z961 Presence of intraocular lens: Secondary | ICD-10-CM | POA: Diagnosis not present

## 2013-09-08 DIAGNOSIS — E119 Type 2 diabetes mellitus without complications: Secondary | ICD-10-CM | POA: Diagnosis not present

## 2013-09-28 ENCOUNTER — Ambulatory Visit (INDEPENDENT_AMBULATORY_CARE_PROVIDER_SITE_OTHER): Payer: Medicare Other | Admitting: Internal Medicine

## 2013-09-28 ENCOUNTER — Encounter: Payer: Self-pay | Admitting: Internal Medicine

## 2013-09-28 VITALS — BP 126/76 | HR 62 | Ht 59.5 in | Wt 156.0 lb

## 2013-09-28 DIAGNOSIS — R059 Cough, unspecified: Secondary | ICD-10-CM | POA: Diagnosis not present

## 2013-09-28 DIAGNOSIS — R05 Cough: Secondary | ICD-10-CM | POA: Diagnosis not present

## 2013-09-28 DIAGNOSIS — J45909 Unspecified asthma, uncomplicated: Secondary | ICD-10-CM | POA: Diagnosis not present

## 2013-09-28 DIAGNOSIS — R053 Chronic cough: Secondary | ICD-10-CM

## 2013-09-28 LAB — PULMONARY FUNCTION TEST
DL/VA % pred: 121 %
DL/VA: 5.05 ml/min/mmHg/L
DLCO UNC: 12.88 ml/min/mmHg
DLCO unc % pred: 70 %
FEF 25-75 POST: 1.7 L/s
FEF 25-75 PRE: 1.08 L/s
FEF2575-%Change-Post: 56 %
FEF2575-%Pred-Post: 114 %
FEF2575-%Pred-Pre: 72 %
FEV1-%Change-Post: 12 %
FEV1-%PRED-PRE: 81 %
FEV1-%Pred-Post: 91 %
FEV1-PRE: 1.24 L
FEV1-Post: 1.4 L
FEV1FVC-%Change-Post: 4 %
FEV1FVC-%PRED-PRE: 100 %
FEV6-%CHANGE-POST: 8 %
FEV6-%Pred-Post: 90 %
FEV6-%Pred-Pre: 83 %
FEV6-Post: 1.71 L
FEV6-Pre: 1.58 L
FEV6FVC-%Pred-Post: 105 %
FEV6FVC-%Pred-Pre: 105 %
FVC-%Change-Post: 8 %
FVC-%PRED-POST: 86 %
FVC-%Pred-Pre: 80 %
FVC-POST: 1.71 L
FVC-Pre: 1.58 L
POST FEV6/FVC RATIO: 100 %
Post FEV1/FVC ratio: 82 %
Pre FEV1/FVC ratio: 78 %
Pre FEV6/FVC Ratio: 100 %
RV % pred: 91 %
RV: 1.74 L
TLC % pred: 82 %
TLC: 3.61 L

## 2013-09-28 MED ORDER — BECLOMETHASONE DIPROPIONATE 80 MCG/ACT IN AERS: INHALATION_SPRAY | RESPIRATORY_TRACT | Status: DC

## 2013-09-28 NOTE — Progress Notes (Signed)
PFT done today. 

## 2013-09-28 NOTE — Patient Instructions (Signed)
Use up your Anoro inhaler 1 puff, once daily, then stop.  Sample Qvar inhaler   2 puffs, then rinse mouth, twice daily every day- try to draw it deep into your chest.

## 2013-09-28 NOTE — Progress Notes (Signed)
08/13/13- 50 yoF former smoker referred courtesy of Dr McKeown-chronic cough/hoarseness  History of childhood asthma. Onset of cough around 1988 when she quit smoking. Told that she wheezes some. She denies any reflux or heartburn. Uses nebulizer once or twice a month but it makes her jittery and does not help the cough. Usually dry cough without shortness of breath. Triggers: Laughter, fragrances, cold air, lying down. Not food or drink. Helpful: Cough drops, sips of liquids. No benefit from Spiriva, albuterol, Advair. History sinusitis. Nasal surgery? Septoplasty. Denies reflux. 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 CXR 08/02/13- IMPRESSION:  No acute cardiopulmonary disease.  Electronically Signed  By: Abigail Miyamoto M.D.  On: 08/02/2013 18:12  09/28/13- 71 yoF former smoker referred courtesy of Dr McKeown-chronic cough/hoarseness FOLLOWS FOR: review PFT results and labs in detail with patient. No change in dry cough, not worse during sleep. Sips of liquids help. Nebulizer is little help. She has not found inhalers to make much difference. Allergy profile 08/13/2013-negative, total IgE 4.8 PFT 09/28/2013-normal spirometry flows with slight response to bronchodilator in small airways, normal lung volumes, diffusion mildly reduced. FVC 1.71/86%, FEV1 1.40/91%, FEV1/FVC 0.82, FEF 25-75% 0.82/101%, TLC 82%, DLCO 70%.   ROS-see HPI Constitutional:   No-   weight loss, night sweats, fevers, chills, fatigue, lassitude. HEENT:   No-  headaches, difficulty swallowing, tooth/dental problems, sore throat,       No-  sneezing, itching, ear ache, nasal congestion, post nasal drip,  CV:  No-chest pain, no-orthopnea, PND, swelling in lower extremities, anasarca, dizziness, palpitations Resp: No-   shortness of breath with exertion or at rest.              No-   productive cough,  + non-productive cough,  No- coughing up of blood.              No-   change in color of mucus.  No-  wheezing.   Skin: No-   rash or lesions. GI:  No-   heartburn, indigestion, abdominal pain, nausea, vomiting,  GU:  MS:  No-   joint pain or swelling.   Neuro-     nothing unusual Psych:  No- change in mood or affect. No depression or anxiety.  No memory loss.  OBJ- Physical Exam General- Alert, Oriented, Affect-appropriate, Distress- none acute Skin- rash-none, lesions- none, excoriation- none Lymphadenopathy- none Head- atraumatic            Eyes- Gross vision intact, PERRLA, conjunctivae and secretions clear            Ears- Hearing, canals-normal            Nose- Clear, no-Septal dev, mucus, polyps, erosion, perforation             Throat- Mallampati II , mucosa clear , drainage- none, tonsils- atrophic. + dentures Neck- flexible , trachea midline, no stridor , thyroid nl, carotid no bruit Chest - symmetrical excursion , unlabored           Heart/CV- RRR , no murmur , no gallop  , no rub, nl s1 s2                           - JVD- none , edema- none, stasis changes- none, varices- none           Lung- clear to P&A, wheeze- none, cough-none, dullness-none, rub- none           Chest wall-  Abd-  Br/ Gen/ Rectal- Not done, not indicated Extrem- cyanosis- none, clubbing, none, atrophy- none, strength- nl Neuro- grossly intact to observation

## 2013-10-07 ENCOUNTER — Telehealth: Payer: Self-pay | Admitting: Internal Medicine

## 2013-10-07 MED ORDER — FLUTICASONE PROPIONATE HFA 110 MCG/ACT IN AERO
2.0000 | INHALATION_SPRAY | Freq: Two times a day (BID) | RESPIRATORY_TRACT | Status: DC
Start: 2013-10-07 — End: 2014-09-21

## 2013-10-07 NOTE — Telephone Encounter (Signed)
I spoke with patient about the change; I called and made [facility] aware(spoke with Lanelle Bal). Rx sent.

## 2013-10-22 NOTE — Assessment & Plan Note (Signed)
We will treat this is a mild asthma with limited potential response to bronchodilators Plan-sample Qvar, medication talk

## 2013-10-22 NOTE — Assessment & Plan Note (Signed)
Plan-sample Qvar

## 2013-11-09 ENCOUNTER — Ambulatory Visit: Payer: Medicare Other | Admitting: Internal Medicine

## 2013-12-06 ENCOUNTER — Ambulatory Visit (INDEPENDENT_AMBULATORY_CARE_PROVIDER_SITE_OTHER): Payer: Medicare Other | Admitting: Internal Medicine

## 2013-12-06 ENCOUNTER — Encounter: Payer: Self-pay | Admitting: Internal Medicine

## 2013-12-06 VITALS — BP 136/82 | HR 63 | Ht 59.5 in | Wt 156.4 lb

## 2013-12-06 DIAGNOSIS — R059 Cough, unspecified: Secondary | ICD-10-CM

## 2013-12-06 DIAGNOSIS — T7840XA Allergy, unspecified, initial encounter: Secondary | ICD-10-CM | POA: Diagnosis not present

## 2013-12-06 DIAGNOSIS — R05 Cough: Secondary | ICD-10-CM | POA: Diagnosis not present

## 2013-12-06 DIAGNOSIS — R053 Chronic cough: Secondary | ICD-10-CM

## 2013-12-06 MED ORDER — ALBUTEROL SULFATE HFA 108 (90 BASE) MCG/ACT IN AERS: INHALATION_SPRAY | RESPIRATORY_TRACT | Status: DC

## 2013-12-06 NOTE — Progress Notes (Signed)
08/13/13- 63 yoF former smoker referred courtesy of Dr McKeown-chronic cough/hoarseness  History of childhood asthma. Onset of cough around 1988 when she quit smoking. Told that she wheezes some. She denies any reflux or heartburn. Uses nebulizer once or twice a month but it makes her jittery and does not help the cough. Usually dry cough without shortness of breath. Triggers: Laughter, fragrances, cold air, lying down. Not food or drink. Helpful: Cough drops, sips of liquids. No benefit from Spiriva, albuterol, Advair. History sinusitis. Nasal surgery? Septoplasty. Denies reflux. 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 CXR 08/02/13- IMPRESSION:  No acute cardiopulmonary disease.  Electronically Signed  By: Abigail Miyamoto M.D.  On: 08/02/2013 18:12  09/28/13- 27 yoF former smoker referred courtesy of Dr McKeown-chronic cough/hoarseness FOLLOWS FOR: review PFT results and labs in detail with patient. No change in dry cough, not worse during sleep. Sips of liquids help. Nebulizer is little help. She has not found inhalers to make much difference. Allergy profile 08/13/2013-negative, total IgE 4.8 PFT 09/28/2013-normal spirometry flows with slight response to bronchodilator in small airways, normal lung volumes, diffusion mildly reduced. FVC 1.71/86%, FEV1 1.40/91%, FEV1/FVC 0.82, FEF 25-75% 0.82/101%, TLC 82%, DLCO 70%.  12/06/13 FOLLOWS FOR: Pt is using both Anoro and Qvar inhalers-not taking like she should-out of sight out of mind. Pt states she used both inhalers this AM and started coughing and threw up. Pt has been having trouble with her sinus as well. 2 weeks and increased chest congestion. Taking Zyrtec. Some nasal mucus. Benzonatate for cough. Laugh triggers coughing. Does not recognize reflux   ROS-see HPI Constitutional:   No-   weight loss, night sweats, fevers, chills, fatigue, lassitude. HEENT:   No-  headaches, difficulty swallowing, tooth/dental problems, sore  throat,       No-  sneezing, itching, ear ache, +nasal congestion, post nasal drip,  CV:  No-chest pain, no-orthopnea, PND, swelling in lower extremities, anasarca, dizziness, palpitations Resp: No-   shortness of breath with exertion or at rest.              No-   productive cough,  + non-productive cough,  No- coughing up of blood.              No-   change in color of mucus.  No- wheezing.   Skin: No-   rash or lesions. GI:  No-   heartburn, indigestion, abdominal pain, nausea, vomiting,  GU:  MS:  No-   joint pain or swelling.   Neuro-     nothing unusual Psych:  No- change in mood or affect. No depression or anxiety.  No memory loss.  OBJ- Physical Exam General- Alert, Oriented, Affect-appropriate, Distress- none acute Skin- rash-none, lesions- none, excoriation- none Lymphadenopathy- none Head- atraumatic            Eyes- Gross vision intact, PERRLA, conjunctivae and secretions clear            Ears- Hearing, canals-normal            Nose- Clear, no-Septal dev, mucus, polyps, erosion, perforation             Throat- Mallampati II , mucosa clear , drainage- none, tonsils- atrophic. + dentures Neck- flexible , trachea midline, no stridor , thyroid nl, carotid no bruit Chest - symmetrical excursion , unlabored           Heart/CV- RRR , no murmur , no gallop  , no rub, nl s1 s2                           -  JVD- none , edema- none, stasis changes- none, varices- none           Lung- clear to P&A, wheeze- none, cough+raspy, dullness-none, rub- none           Chest wall-  Abd-  Br/ Gen/ Rectal- Not done, not indicated Extrem- cyanosis- none, clubbing, none, atrophy- none, strength- nl Neuro- grossly intact to observation

## 2013-12-06 NOTE — Patient Instructions (Signed)
Script printed- albuterol HFA rescue inhaler    1-2 puffs every 6 hours as needed bronchodilator  Try leaving off Anoro  Continue Qvar inhaler 2 puffs, then rinse mouth, twice daily.    Leave this one on your bathroom sink and use it right before you would brush teeth

## 2013-12-09 ENCOUNTER — Ambulatory Visit (INDEPENDENT_AMBULATORY_CARE_PROVIDER_SITE_OTHER): Payer: Medicare Other | Admitting: Physician Assistant

## 2013-12-09 ENCOUNTER — Encounter: Payer: Self-pay | Admitting: Physician Assistant

## 2013-12-09 VITALS — BP 110/72 | HR 60 | Temp 97.7°F | Resp 16 | Ht 60.0 in | Wt 155.0 lb

## 2013-12-09 DIAGNOSIS — I10 Essential (primary) hypertension: Secondary | ICD-10-CM

## 2013-12-09 DIAGNOSIS — Z0000 Encounter for general adult medical examination without abnormal findings: Secondary | ICD-10-CM | POA: Diagnosis not present

## 2013-12-09 DIAGNOSIS — F3289 Other specified depressive episodes: Secondary | ICD-10-CM

## 2013-12-09 DIAGNOSIS — F329 Major depressive disorder, single episode, unspecified: Secondary | ICD-10-CM

## 2013-12-09 DIAGNOSIS — E119 Type 2 diabetes mellitus without complications: Secondary | ICD-10-CM

## 2013-12-09 DIAGNOSIS — Z79899 Other long term (current) drug therapy: Secondary | ICD-10-CM

## 2013-12-09 DIAGNOSIS — E559 Vitamin D deficiency, unspecified: Secondary | ICD-10-CM

## 2013-12-09 DIAGNOSIS — E785 Hyperlipidemia, unspecified: Secondary | ICD-10-CM

## 2013-12-09 DIAGNOSIS — R209 Unspecified disturbances of skin sensation: Secondary | ICD-10-CM

## 2013-12-09 LAB — HEMOGLOBIN A1C
Hgb A1c MFr Bld: 7 % — ABNORMAL HIGH (ref <5.7)
Mean Plasma Glucose: 154 mg/dL — ABNORMAL HIGH (ref <117)

## 2013-12-09 NOTE — Patient Instructions (Addendum)
Call [employer]/your insurance and see how much the shingles vaccine is  Preventative Care for Adults - Female      [place]:  A routine yearly physical is a good way to check in with your primary care provider about your health and preventive screening. It is also an opportunity to share updates about your health and any concerns you have, and receive a thorough all-over exam.   Most health insurance companies pay for at least some preventative services.  Check with your health plan for specific coverages.  WHAT PREVENTATIVE SERVICES DO WOMEN NEED?  Adult women should have their weight and blood pressure checked regularly.   Women age 42 and older should have their cholesterol levels checked regularly.  Women should be screened for cervical cancer with a Pap smear and pelvic exam beginning at either age 23, or 3 years after they become sexually activity.    Breast cancer screening generally begins at age 17 with a mammogram and breast exam by your primary care provider.    Beginning at age 22 and continuing to age 1, women should be screened for colorectal cancer.  Certain people may need continued testing until age 37.  Updating vaccinations is part of preventative care.  Vaccinations help protect against diseases such as the flu.  Osteoporosis is a disease in which the bones lose minerals and strength as we age. Women ages 33 and over should discuss this with their caregivers, as should women after menopause who have other risk factors.  Lab tests are generally done as part of preventative care to screen for anemia and blood disorders, to screen for problems with the kidneys and liver, to screen for bladder problems, to check blood sugar, and to check your cholesterol level.  Preventative services generally include counseling about diet, exercise, avoiding tobacco, drugs, excessive alcohol consumption, and sexually transmitted infections.    GENERAL RECOMMENDATIONS  FOR GOOD HEALTH:  Healthy diet:  Eat a variety of foods, including fruit, vegetables, animal or vegetable protein, such as meat, fish, chicken, and eggs, or beans, lentils, tofu, and grains, such as rice.  Drink plenty of water daily.  Decrease saturated fat in the diet, avoid lots of red meat, processed foods, sweets, fast foods, and fried foods.  Exercise:  Aerobic exercise helps maintain good heart health. At least 30-40 minutes of moderate-intensity exercise is recommended. For example, a brisk walk that increases your heart rate and breathing. This should be done on most days of the week.   Find a type of exercise or a variety of exercises that you enjoy so that it becomes a part of your daily life.  Examples are running, walking, swimming, water aerobics, and biking.  For motivation and support, explore group exercise such as aerobic class, spin class, Zumba, Yoga,or  martial arts, etc.    Set exercise goals for yourself, such as a certain weight goal, walk or run in a race such as a 5k walk/run.  Speak to your primary care provider about exercise goals.  Disease prevention:  If you smoke or chew tobacco, find out from your caregiver how to quit. It can literally save your life, no matter how long you have been a tobacco user. If you do not use tobacco, never begin.   Maintain a healthy diet and normal weight. Increased weight leads to problems with blood pressure and diabetes.   The Body Mass Index or BMI is a way of measuring how much of your body is  fat. Having a BMI above 27 increases the risk of heart disease, diabetes, hypertension, stroke and other problems related to obesity. Your caregiver can help determine your BMI and based on it develop an exercise and dietary program to help you achieve or maintain this important measurement at a healthful level.  High blood pressure causes heart and blood vessel problems.  Persistent high blood pressure should be treated with medicine  if weight loss and exercise do not work.   Fat and cholesterol leaves deposits in your arteries that can block them. This causes heart disease and vessel disease elsewhere in your body.  If your cholesterol is found to be high, or if you have heart disease or certain other medical conditions, then you may need to have your cholesterol monitored frequently and be treated with medication.   Ask if you should have a cardiac stress test if your history suggests this. A stress test is a test done on a treadmill that looks for heart disease. This test can find disease prior to there being a problem.  Menopause can be associated with physical symptoms and risks. Hormone replacement therapy is available to decrease these. You should talk to your caregiver about whether starting or continuing to take hormones is right for you.   Osteoporosis is a disease in which the bones lose minerals and strength as we age. This can result in serious bone fractures. Risk of osteoporosis can be identified using a bone density scan. Women ages 75 and over should discuss this with their caregivers, as should women after menopause who have other risk factors. Ask your caregiver whether you should be taking a calcium supplement and Vitamin D, to reduce the rate of osteoporosis.   Avoid drinking alcohol in excess (more than two drinks per day).  Avoid use of street drugs. Do not share needles with anyone. Ask for professional help if you need assistance or instructions on stopping the use of alcohol, cigarettes, and/or drugs.  Brush your teeth twice a day with fluoride toothpaste, and floss once a day. Good oral hygiene prevents tooth decay and gum disease. The problems can be painful, unattractive, and can cause other health problems. Visit your dentist for a routine oral and dental check up and preventive care every 6-12 months.   Look at your skin regularly.  Use a mirror to look at your back. Notify your caregivers of changes  in moles, especially if there are changes in shapes, colors, a size larger than a pencil eraser, an irregular border, or development of new moles.  Safety:  Use seatbelts 100% of the time, whether driving or as a passenger.  Use safety devices such as hearing protection if you work in environments with loud noise or significant background noise.  Use safety glasses when doing any work that could send debris in to the eyes.  Use a helmet if you ride a bike or motorcycle.  Use appropriate safety gear for contact sports.  Talk to your caregiver about gun safety.  Use sunscreen with a SPF (or skin protection factor) of 15 or greater.  Lighter skinned people are at a greater risk of skin cancer. Don't forget to also wear sunglasses in order to protect your eyes from too much damaging sunlight. Damaging sunlight can accelerate cataract formation.   Practice safe sex. Use condoms. Condoms are used for birth control and to help reduce the spread of sexually transmitted infections (or STIs).  Some of the STIs are gonorrhea (the clap), chlamydia,  syphilis, trichomonas, herpes, HPV (human papilloma virus) and HIV (human immunodeficiency virus) which causes AIDS. The herpes, HIV and HPV are viral illnesses that have no cure. These can result in disability, cancer and death.   Keep carbon monoxide and smoke detectors in your home functioning at all times. Change the batteries every 6 months or use a model that plugs into the wall.   Vaccinations:  Stay up to date with your tetanus shots and other required immunizations. You should have a booster for tetanus every 10 years. Be sure to get your flu shot every year, since 5%-20% of the [place] population comes down with the flu. The flu vaccine changes each year, so being vaccinated once is not enough. Get your shot in the fall, before the flu season peaks.   Other vaccines to consider:  Human Papilloma Virus or HPV causes cancer of the cervix, and other infections  that can be transmitted from person to person. There is a vaccine for HPV, and females should get immunized between the ages of 28 and 56. It requires a series of 3 shots.   Pneumococcal vaccine to protect against certain types of pneumonia.  This is normally recommended for adults age 80 or older.  However, adults younger than 68 years old with certain underlying conditions such as diabetes, heart or lung disease should also receive the vaccine.  Shingles vaccine to protect against Varicella Zoster if you are older than age 10, or younger than 68 years old with certain underlying illness.  Hepatitis A vaccine to protect against a form of infection of the liver by a virus acquired from food.  Hepatitis B vaccine to protect against a form of infection of the liver by a virus acquired from blood or body fluids, particularly if you work in health care.  If you plan to travel internationally, check with your local health department for specific vaccination recommendations.  Cancer Screening:  Breast cancer screening is essential to preventive care for women. All women age 23 and older should perform a breast self-exam every month. At age 12 and older, women should have their caregiver complete a breast exam each year. Women at ages 71 and older should have a mammogram (x-ray film) of the breasts. Your caregiver can discuss how often you need mammograms.    Cervical cancer screening includes taking a Pap smear (sample of cells examined under a microscope) from the cervix (end of the uterus). It also includes testing for HPV (Human Papilloma Virus, which can cause cervical cancer). Screening and a pelvic exam should begin at age 30, or 3 years after a woman becomes sexually active. Screening should occur every year, with a Pap smear but no HPV testing, up to age 77. After age 59, you should have a Pap smear every 3 years with HPV testing, if no HPV was found previously.   Most routine colon cancer  screening begins at the age of 82. On a yearly basis, doctors may provide special easy to use take-home tests to check for hidden blood in the stool. Sigmoidoscopy or colonoscopy can detect the earliest forms of colon cancer and is life saving. These tests use a small camera at the end of a tube to directly examine the colon. Speak to your caregiver about this at age 30, when routine screening begins (and is repeated every 5 years unless early forms of pre-cancerous polyps or small growths are found).    Advance Directive Advance directives are the legal documents that allow  you to make choices about your health care and medical treatment if you cannot speak for yourself. Advance directives are a way for you to communicate your wishes to family, friends, and health care providers. The specified people can then convey your decisions about end-of-life care to avoid confusion if you should become unable to communicate. Ideally, the process of discussing and writing advance directives should be discussed over time rather than making decisions all at once. Advance directives can be modified as your situation changes and you can change your mind at any time even after you have signed the advance directives. Each state has its own laws regarding advance directives. You may want to check with your health care provider, attorney, or state representative about the law in your state. Below are some examples of advance directives. LIVING WILL A living will is a set of instructions documenting your wishes about medical care when you cannot care for yourself. It is used if you become:  Terminally ill.  Incapacitated.  Unable to communicate.  Unable to make decisions. Items to consider in your living will include:  The use or non-use of life-sustaining equipment, such as dialysis machines and breathing machines (ventilators).  A "do not resuscitate" (DNR) order, which is the instruction not to use CPR if  breathing or heartbeat stops.  Tube feeding.  Withholding of food and fluids.  Comfort (palliative) care when the goal becomes comfort rather than a cure.  Organ and tissue donation. A living does not give instructions about distribution of your money and property if you should pass away. It is advisable to seek the expert advice of a lawyer in drawing up a will regarding your possessions. Decisions about taxes, beneficiaries, and asset distribution will be legally binding. This process can relieve your family and friends of any burdens surrounding disputes or questions that may come up about the allocation of your assets. DO NOT RESUSCITATE (DNR) A do not resuscitate (DNR) order is a request to not have cardiopulmonary resuscitation (CPR) in the event that your heart stops or you stop breathing. Unless given other instructions, a health care provider will try to help any patient whose heart has stopped or who has stopped breathing.  HEALTHCARE PROXY AND DURABLE POWER OF ATTORNEY FOR HEALTH CARE A health care proxy is a person (agent) appointed to make medical decisions for you if you cannot. Generally, people choose someone they know well and trust to represent their preferences when they can no longer do so. You should be sure to ask this person for agreement to act as your agent. An agent may have to exercise judgment in the event of a medical decision for which your wishes are not known. The durable power of attorney for health care is the legal document that names your health care proxy. Once written, it should be:  Signed.  Notarized.  Dated.  Copied.  Witnessed.  Incorporated into your medical record. You may also want to appoint someone to manage your financial affairs if you cannot. This is called a durable power of attorney for finances. It is a separate legal document from the durable power of attorney for health care. You may choose the same person or someone different from  your health care proxy to act as your agent in financial matters. Document Released: 12/17/2007 Document Revised: 05/12/2013 Document Reviewed: 01/27/2013 [facility] Patient Information 2014 [place], [place].    Bad carbs also include fruit juice, alcohol, and sweet tea. These are empty calories that do  not signal to your brain that you are full.   Please remember the good carbs are still carbs which convert into sugar. So please measure them out no more than 1/2-1 cup of rice, oatmeal, pasta, and beans.  Veggies are however free foods! Pile them on.   I like lean protein at every meal such as chicken, [place], pork chops, cottage cheese, etc. Just do not fry these meats and please center your meal around vegetable, the meats should be a side dish.   No all fruit is created equal. Please see the list below, the fruit at the bottom is higher in sugars than the fruit at the top

## 2013-12-09 NOTE — Progress Notes (Signed)
Subjective:   Nancy Myers is a 68 y.o. female who presents for Medicare Annual Wellness Visit and 3 month follow up on hypertension, diabetes, hyperlipidemia, vitamin D def.  Date of last medicare wellness visit is unknown.   Her blood pressure has been controlled at home, today their BP is BP: 110/72 mmHg She does workout. She denies chest pain, shortness of breath, dizziness.  She is on cholesterol medication and denies myalgias. Her cholesterol is at goal. The cholesterol last visit was:   Lab Results  Component Value Date   CHOL 157 09/03/2013   HDL 47 09/03/2013   LDLCALC 85 09/03/2013   TRIG 124 09/03/2013   CHOLHDL 3.3 09/03/2013   She has been working on diet and exercise for diabetes, and denies polydipsia, polyuria and visual disturbances. Last A1C in the office was:  Lab Results  Component Value Date   HGBA1C 7.0* 09/03/2013   DM neuropathy- controlled Patient is on Vitamin D supplement.  Names of Other Physician/Practitioners you currently use: 1. [facility]- here for primary care 2. Dr. Yvonne Kendall, eye doctor, last visit Dec 2014 3. unknown, dentist, last visit 1 year Patient Care Team: Unk Pinto, MD as PCP - General (Internal Medicine) Winfield Cunas., MD as Consulting Physician (Gastroenterology) Pixie Casino, MD as Consulting Physician (Cardiology) Deneise Lever, MD as Consulting Physician (Pulmonary Disease)  Medication Review Current Outpatient Prescriptions on File Prior to Visit  Medication Sig Dispense Refill  . albuterol (PROVENTIL HFA;VENTOLIN HFA) 108 (90 BASE) MCG/ACT inhaler 2 puffs every 6 hours if needed- rescue inhaler  1 Inhaler  prn  . aspirin 81 MG chewable tablet Chew 324 mg by mouth daily. Take 4 daily until seen by [facility] in Dec.      . atorvastatin (LIPITOR) 40 MG tablet Take 40 mg by mouth daily.      . benzonatate (TESSALON) 200 MG capsule Take 1 capsule (200 mg total) by mouth 3  (three) times daily as needed for cough.  30 capsule  1  . budesonide (PULMICORT) 0.5 MG/2ML nebulizer solution Take 0.5 mg by nebulization 2 (two) times daily.      . carisoprodol (SOMA) 250 MG tablet Take 250 mg by mouth every 4 (four) hours as needed.      . Cholecalciferol 5000 UNITS TABS Take 5,000-10,000 Units by mouth daily. Monday-Fridays =10,000iu Sat and Sun = 5,000iu      . estrogen, conjugated,-medroxyprogesterone (PREMPRO) 0.3-1.5 MG per tablet Take 1 tablet by mouth daily.      Marland Kitchen FLUoxetine (PROZAC) 20 MG capsule Take 20 mg by mouth 2 (two) times daily.      . fluticasone (FLOVENT HFA) 110 MCG/ACT inhaler Inhale 2 puffs into the lungs 2 (two) times daily. RINSE MOUTH AFTER USE  1 Inhaler  11  . hydrochlorothiazide (HYDRODIURIL) 25 MG tablet Take 25 mg by mouth daily.      Marland Kitchen ipratropium-albuterol (DUONEB) 0.5-2.5 (3) MG/3ML SOLN Take 3 mLs by nebulization every 6 (six) hours as needed.      Marland Kitchen losartan (COZAAR) 100 MG tablet Take 100 mg by mouth daily.      . Magnesium 500 MG TABS Take 500 mg by mouth daily.       . metFORMIN (GLUCOPHAGE) 500 MG tablet Take 500 mg by mouth 2 (two) times daily with a meal.       . metoprolol (TOPROL-XL) 200 MG 24 hr tablet Take 200 mg by mouth daily.      Marland Kitchen  Misc Natural Products (OSTEO BI-FLEX JOINT SHIELD PO) Take by mouth.      . Umeclidinium-Vilanterol (ANORO ELLIPTA) 62.5-25 MCG/INH AEPB Inhale 1 puff into the lungs daily.  30 each  11   No current facility-administered medications on file prior to visit.    Current Problems (verified) Patient Active Problem List   Diagnosis Date Noted  . Scintillating scotoma of both eyes 08/16/2013  . Hyperlipidemia   . Diabetes mellitus without complication   . GERD (gastroesophageal reflux disease)   . Anxiety   . IBS (irritable bowel syndrome)   . Vitamin D deficiency   . ANEMIA 08/08/2009  . DEPRESSION 08/08/2009  . HYPERTENSION 08/08/2009  . Unspecified asthma(493.90) 08/08/2009  . FATIGUE  08/08/2009  . PARESTHESIA 08/08/2009  . SHORTNESS OF BREATH 08/08/2009  . Chronic cough 08/08/2009  . CHEST PAIN UNSPECIFIED 08/08/2009  . ALLERGY 08/08/2009    Screening Tests Health Maintenance  Topic Date Due  . Zostavax  08/04/2006  . Influenza Vaccine  04/23/2014  . Mammogram  06/18/2015  . Tetanus/tdap  10/03/2020  . Colonoscopy  01/31/2022  . Pneumococcal Polysaccharide Vaccine Age 55 And Over  Completed     Immunization History  Administered Date(s) Administered  . Influenza Split 06/23/2013  . Pneumococcal-Unspecified 09/03/2012  . Td 10/03/2010    Preventative care: Tetanus: 2012  Pneumovax:2013  Flu vaccine: 2014  Zostavax: Needs to check with insurance Pap: 2010  MGM: 05/2013 neg  DEXA: N/A  Colonoscopy: 2013 Due 5 years Dr. Oletta Lamas  EGD: N/A   History reviewed: allergies, current medications, past family history, past medical history, past social history, past surgical history and problem list  Risk Factors: Osteoporosis: postmenopausal estrogen deficiency and dietary calcium and/or vitamin D deficiency History of fracture in the past year: no  Tobacco History  Substance Use Topics  . Smoking status: Former Smoker    Types: Cigarettes    Quit date: 09/23/1986  . Smokeless tobacco: Never Used  . Alcohol Use: No   She does not smoke.  Patient is a former smoker. Are there smokers in your home (other than you)?  No  Alcohol Current alcohol use: none  Caffeine Current caffeine use: coffee 1-2 /day  Exercise Exercise limitations: The patient has no exercise limitations. Current exercise: bicycling, housecleaning and walking  Nutrition/Diet Current diet: in general, a "healthy" diet    Cardiac risk factors: advanced age (older than 24 for men, 70 for women), diabetes mellitus, dyslipidemia, hypertension, obesity (BMI >= 30 kg/m2) and sedentary lifestyle.  Depression Screen Nurse depression screen reviewed.  (Note: if answer to either  of the following is "Yes", a more complete depression screening is indicated)   Q1: Over the past two weeks, have you felt down, depressed or hopeless? No  Q2: Over the past two weeks, have you felt little interest or pleasure in doing things? No  Have you lost interest or pleasure in daily life? No  Do you often feel hopeless? No  Do you cry easily over simple problems? No  Activities of Daily Living Nurse ADLs screen reviewed.  In your present state of health, do you have any difficulty performing the following activities?:  Driving? No Managing money?  No Feeding yourself? No Getting from bed to chair? No Climbing a flight of stairs? No Preparing food and eating?: No Bathing or showering? No Getting dressed: No Getting to the toilet? No Using the toilet:No Moving around from place to place: No In the past year have you fallen or  had a near fall?:No   Are you sexually active?  No  Do you have more than one partner?  No  Vision Difficulties: No  Hearing Difficulties: No Do you often ask people to speak up or repeat themselves? No Do you experience ringing or noises in your ears? No Do you have difficulty understanding soft or whispered voices? No  Cognition  Do you feel that you have a problem with memory?No  Do you often misplace items? No  Do you feel safe at home?  Yes  Advanced directives Does patient have a [place]? No Does patient have a Living Will? No    Objective:     Vision and hearing screens reviewed.   Blood pressure 110/72, pulse 60, temperature 97.7 F (36.5 C), resp. rate 16, height 5' (1.524 m), weight 155 lb (70.308 kg). Body mass index is 30.27 kg/(m^2).  General appearance: alert, no distress, WD/WN,  female Cognitive Testing  Alert? Yes  Normal Appearance?Yes  Oriented to person? Yes  Place? Yes   Time? Yes  Recall of three objects?  Yes  Can perform simple calculations? Yes  Displays appropriate  judgment?Yes  Can read the correct time from a watch face?Yes  HEENT: normocephalic, sclerae anicteric, TMs pearly, nares patent, no discharge or erythema, pharynx normal Oral cavity: MMM, no lesions Neck: supple, no lymphadenopathy, no thyromegaly, no masses Heart: RRR, normal S1, S2, no murmurs Lungs: CTA bilaterally, no wheezes, rhonchi, or rales Abdomen: +bs, soft, obese, non tender, non distended, no masses, no hepatomegaly, no splenomegaly Musculoskeletal: nontender, no swelling, no obvious deformity Extremities: no edema, no cyanosis, no clubbing Pulses: 2+ symmetric, upper and lower extremities, normal cap refill Neurological: alert, oriented x 3, CN2-12 intact, strength normal upper extremities and lower extremities, sensation decreased bilateral fee no ulcers, DTRs 2+ throughout, no cerebellar signs, gait normal Psychiatric: normal affect, behavior normal, pleasant  Breast: defer Gyn: defer.   Rectal: defer   Assessment:   1. HYPERTENSION At goal - CBC with Differential - BASIC METABOLIC PANEL WITH GFR - Hepatic function panel - TSH  2. Diabetes mellitus without complication Discussed general issues about diabetes pathophysiology and management., Educational material distributed., Suggested low cholesterol diet., Encouraged aerobic exercise., Discussed foot care., Reminded to get yearly retinal exam. - Hemoglobin A1c - Insulin, fasting  3. DEPRESSION remission  4. Hyperlipidemia - Lipid panel  5. PARESTHESIA - explained needs to not walk around bare foot, and to do daily feet check and decrease weight/sugars.   6. Vitamin D deficiency  7. Encounter for long-term (current) use of other medications - Magnesium   Plan:   During the course of the visit the patient was educated and counseled about appropriate screening and preventive services including:    Influenza vaccine  Screening electrocardiogram  Screening mammography  Bone densitometry  screening  Colorectal cancer screening  Diabetes screening  Glaucoma screening  Nutrition counseling   Advanced directives: given papers and information  Screening recommendations, referrals:  Vaccinations: Tdap vaccine no  Influenza vaccine no Pneumococcal vaccine no Shingles vaccine will call insurance to check on price Hep B vaccine no  Nutrition assessed and recommended  Colonoscopy no Mammogram yes Pap smear no Pelvic exam no Recommended yearly ophthalmology/optometry visit for glaucoma screening and checkup Recommended yearly dental visit for hygiene and checkup Advanced directives - yes  Conditions/risks identified: BMI: Discussed weight loss, diet, and increase physical activity.  Increase physical activity: AHA recommends 150 minutes of physical activity a week.  Medications reviewed DEXA- declined Diabetes is not at goal, ACE/ARB therapy: Yes. Urinary Incontinence is not an issue: discussed non pharmacology and pharmacology options.  Fall risk: low- discussed PT, home fall assessment, medications.   Medicare Attestation I have personally reviewed: The patient's medical and social history Their use of alcohol, tobacco or illicit drugs Their current medications and supplements The patient's functional ability including ADLs,fall risks, home safety risks, cognitive, and hearing and visual impairment Diet and physical activities Evidence for depression or mood disorders  The patient's weight, height, BMI, and visual acuity have been recorded in the chart.  I have made referrals, counseling, and provided education to the patient based on review of the above and I have provided the patient with a written personalized care plan for preventive services.     Vicie Mutters, PA-C   12/09/2013

## 2013-12-10 LAB — LIPID PANEL
Cholesterol: 137 mg/dL (ref 0–200)
HDL: 39 mg/dL — AB (ref >39)
LDL Cholesterol: 81 mg/dL (ref 0–99)
TRIGLYCERIDES: 84 mg/dL (ref <150)
Total CHOL/HDL Ratio: 3.5 Ratio
VLDL: 17 mg/dL (ref 0–40)

## 2013-12-10 LAB — CBC WITH DIFFERENTIAL/PLATELET
BASOS ABS: 0 10*3/uL (ref 0.0–0.1)
BASOS PCT: 1 % (ref 0–1)
Eosinophils Absolute: 0.1 10*3/uL (ref 0.0–0.7)
Eosinophils Relative: 2 % (ref 0–5)
HEMATOCRIT: 38.6 % (ref 36.0–46.0)
Hemoglobin: 12.6 g/dL (ref 12.0–15.0)
LYMPHS PCT: 51 % — AB (ref 12–46)
Lymphs Abs: 2.1 10*3/uL (ref 0.7–4.0)
MCH: 27.2 pg (ref 26.0–34.0)
MCHC: 32.6 g/dL (ref 30.0–36.0)
MCV: 83.2 fL (ref 78.0–100.0)
MONO ABS: 0.3 10*3/uL (ref 0.1–1.0)
Monocytes Relative: 7 % (ref 3–12)
NEUTROS ABS: 1.6 10*3/uL — AB (ref 1.7–7.7)
NEUTROS PCT: 39 % — AB (ref 43–77)
PLATELETS: 299 10*3/uL (ref 150–400)
RBC: 4.64 MIL/uL (ref 3.87–5.11)
RDW: 14.4 % (ref 11.5–15.5)
WBC: 4.2 10*3/uL (ref 4.0–10.5)

## 2013-12-10 LAB — MAGNESIUM: Magnesium: 1.9 mg/dL (ref 1.5–2.5)

## 2013-12-10 LAB — BASIC METABOLIC PANEL WITH GFR
BUN: 10 mg/dL (ref 6–23)
CHLORIDE: 103 meq/L (ref 96–112)
CO2: 30 mEq/L (ref 19–32)
Calcium: 9.1 mg/dL (ref 8.4–10.5)
Creat: 0.69 mg/dL (ref 0.50–1.10)
Glucose, Bld: 125 mg/dL — ABNORMAL HIGH (ref 70–99)
POTASSIUM: 4.4 meq/L (ref 3.5–5.3)
SODIUM: 140 meq/L (ref 135–145)

## 2013-12-10 LAB — HEPATIC FUNCTION PANEL
ALT: 14 U/L (ref 0–35)
AST: 20 U/L (ref 0–37)
Albumin: 3.9 g/dL (ref 3.5–5.2)
Alkaline Phosphatase: 72 U/L (ref 39–117)
BILIRUBIN DIRECT: 0.1 mg/dL (ref 0.0–0.3)
Indirect Bilirubin: 0.4 mg/dL (ref 0.2–1.2)
TOTAL PROTEIN: 6.8 g/dL (ref 6.0–8.3)
Total Bilirubin: 0.5 mg/dL (ref 0.2–1.2)

## 2013-12-10 LAB — TSH: TSH: 1.143 u[IU]/mL (ref 0.350–4.500)

## 2013-12-10 LAB — INSULIN, FASTING: Insulin fasting, serum: 13 u[IU]/mL (ref 3–28)

## 2013-12-17 IMAGING — CT CT ABD-PELV W/ CM
3 of 5 series · 12 of 36 positions shown, 18 images · IV contrast (READICAT/WATER & [ID] OMNI 300)
Comparison: Ultrasound of the abdomen of 12/26/2008

CLINICAL DATA: The left back and left pelvic pain for 1 week,
history of carcinoma of the colon in 1333

CT ABDOMEN AND PELVIS WITH CONTRAST
TECHNIQUE: Multidetector CT imaging of the abdomen and pelvis was
performed following the standard protocol during bolus
administration of intravenous contrast.
Contrast:  100 ml [id number]

[Series 3: abd/pelvis with · axial · 0.70mm/px · z∈[-292,-46]mm · 6 of 67 slices shown, 11 images]
[im 10/67  soft-tissue]
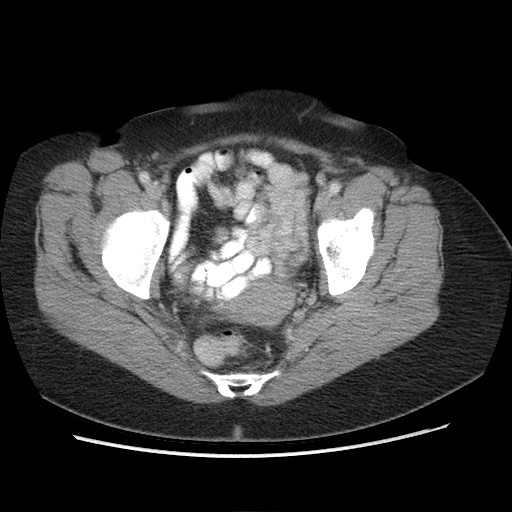
[im 10/67  bone]
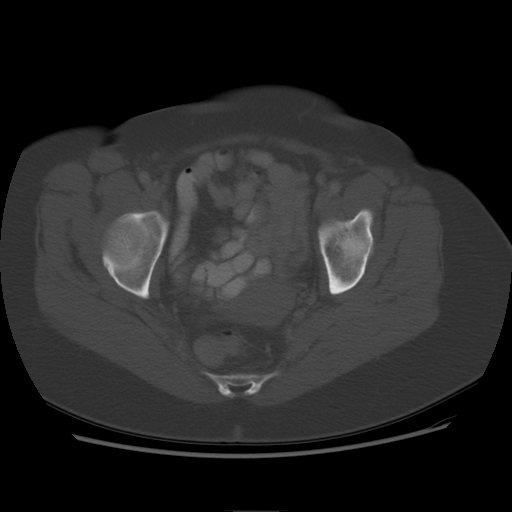
[im 19/67  soft-tissue]
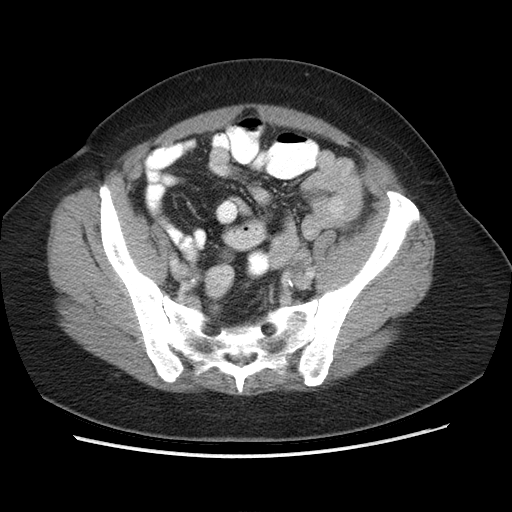
[im 29/67  soft-tissue]
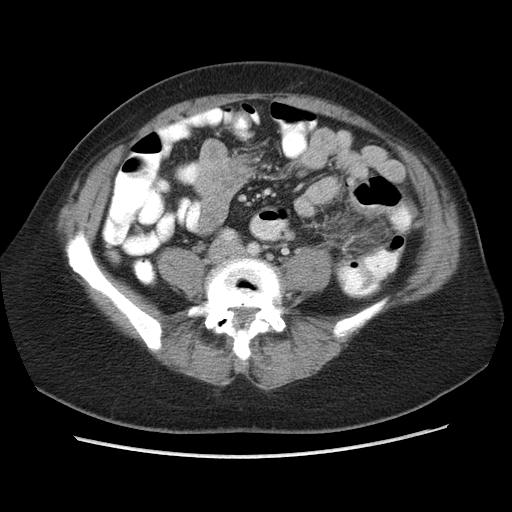
[im 29/67  lung]
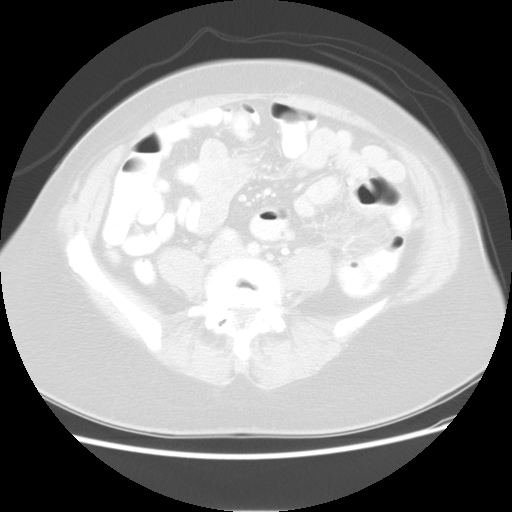
[im 38/67  soft-tissue]
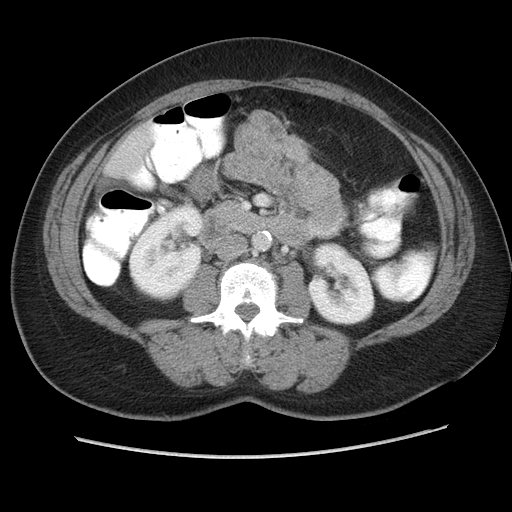
[im 38/67  lung]
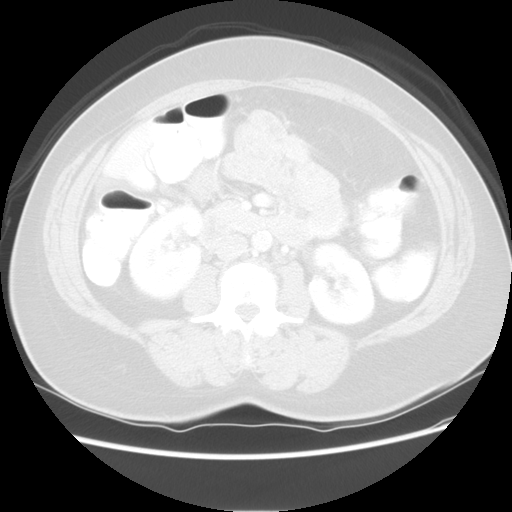
[im 48/67  soft-tissue]
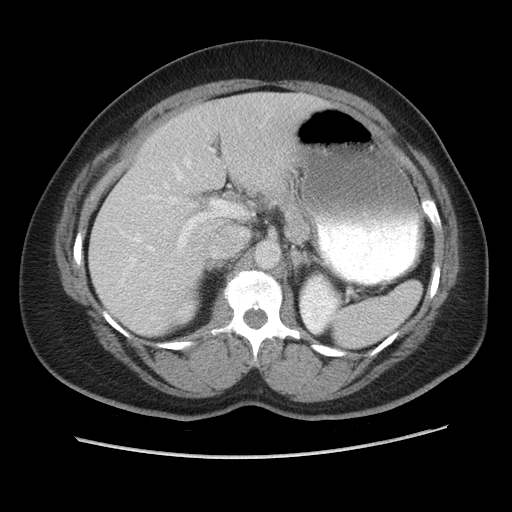
[im 48/67  lung]
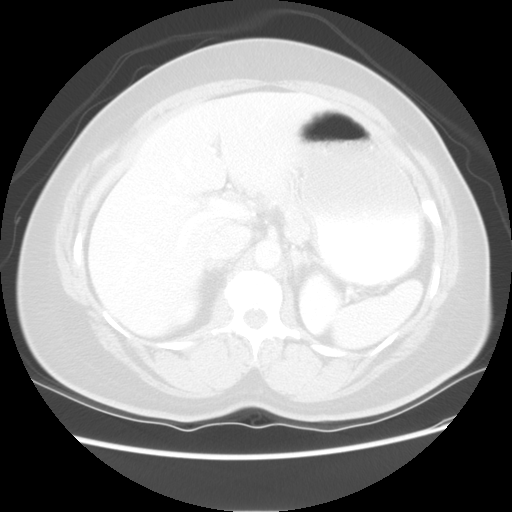
[im 57/67  soft-tissue]
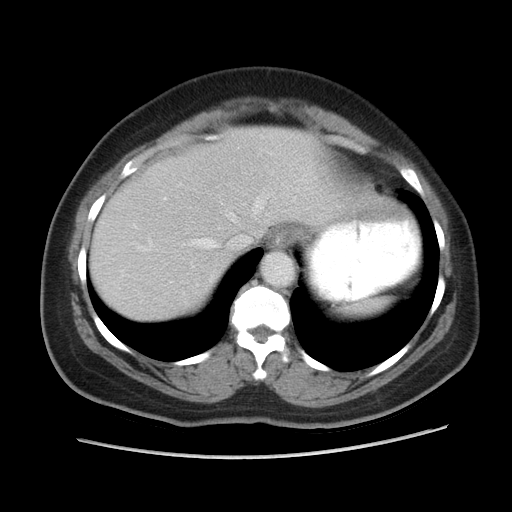
[im 57/67  lung]
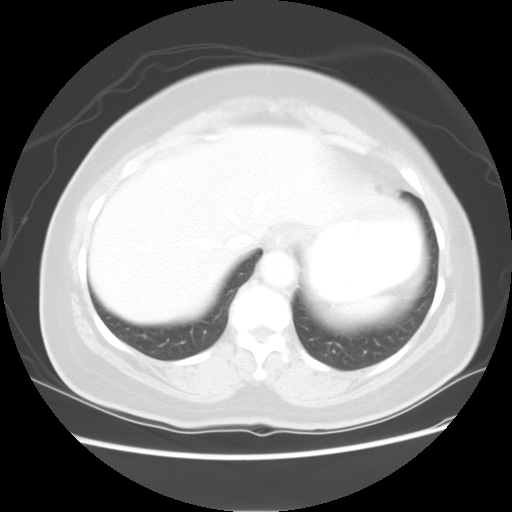

[Series 601: coronal body · coronal · 0.81mm/px · 1 of 117 slices shown, 2 images]
[im 39/117  soft-tissue]
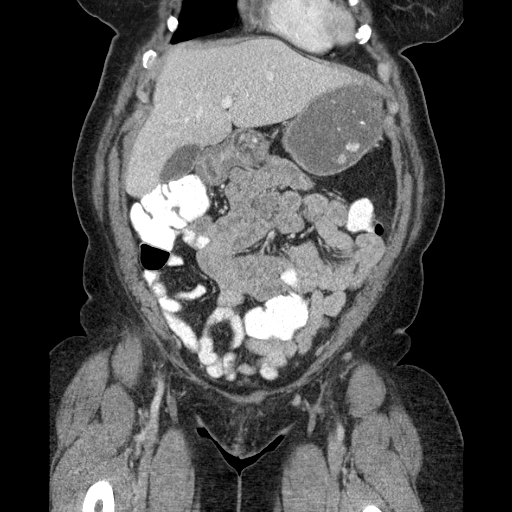
[im 39/117  bone]
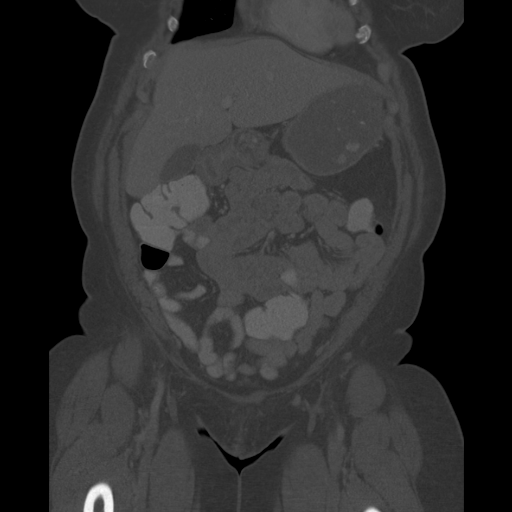

[Series 602: sagittal body · sagittal · 0.81mm/px · 5 of 145 slices shown]
[im 17/145  soft-tissue]
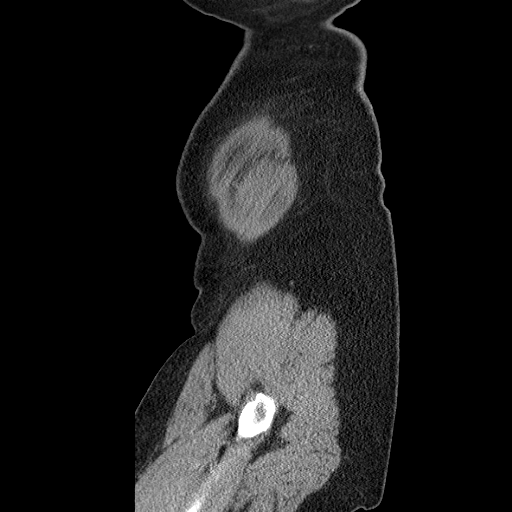
[im 34/145  soft-tissue]
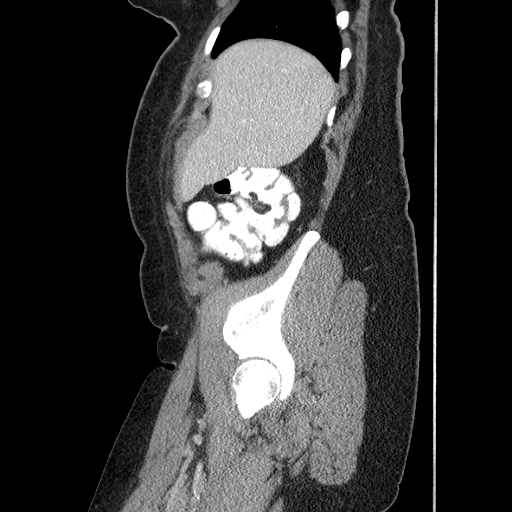
[im 51/145  soft-tissue]
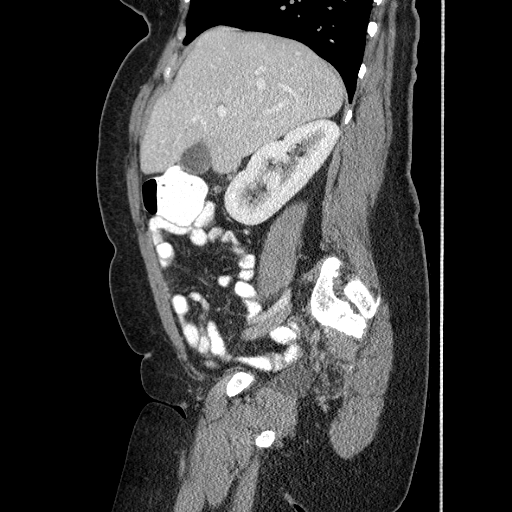
[im 68/145  soft-tissue]
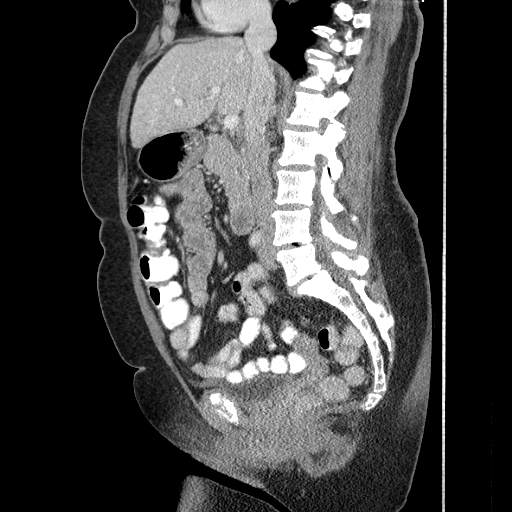
[im 85/145  soft-tissue]
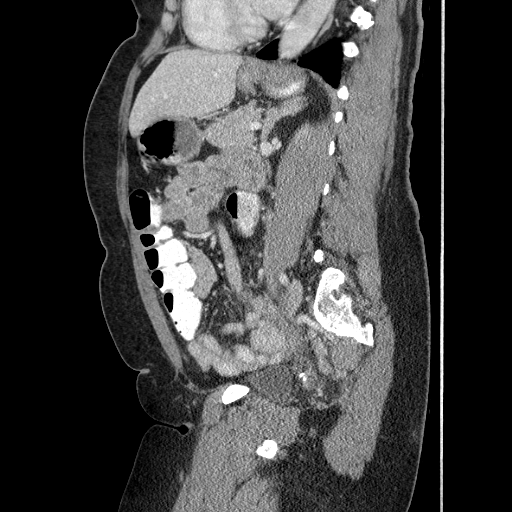

[12 of 36 positions shown; findings below may reference images not displayed]

FINDINGS: The lung bases are clear.  There is a metallic appearing
opacity within the distal esophageal lumen of questionable
significance.  This may be ingested.  The liver enhances with no
focal abnormality and no ductal dilatation is seen.  The
gallbladder is visualized and no calcified gallstones are seen. The
pancreas is normal in size and the pancreatic duct is not dilated.
The adrenal glands and spleen are unremarkable.  The stomach is
moderately fluid distended with contrast and no abnormality is
seen.  The kidneys enhance with no calculus or mass and no
hydronephrosis is seen.  A small cyst emanates from the lower pole
of the left kidney anteromedially.  The abdominal aorta is normal
in caliber with moderate atheromatous change present.  No
adenopathy is seen.

Within the left lower quadrant , there is an inflammatory process
present. The sigmoid colon extends somewhat cephalad within the
left lower quadrant and there is mucosal thickening of that segment
of the sigmoid colon as it merges with the distal descending colon.
There is adjacent soft tissue strandiness where diverticula are
present, consistent with acute diverticulitis.  No abscess is seen.
There are diverticula in the remainder of the rectosigmoid colon as
well.

The uterus is normal in size.  No adnexal lesion is seen.  The
urinary bladder is not well distended but is unremarkable.  No free
fluid is seen within the pelvis.  The terminal ileum is
unremarkable.  The appendix is not definitely seen.  Degenerative
disc disease is present particularly at L4-5 and L5-S1 levels.
There is degenerative change in the facet joints of those levels as
well.
IMPRESSION: 1.  Acute diverticulitis of the sigmoid colon apex as it merges
with the distal descending colon.  No abscess.
2.  Multiple rectosigmoid colonic diverticula.
3.  Metallic opacity within the distal esophageal lumen most likely
ingested.  Correlate clinically.

4.  Degenerative disc disease at L4-5 and L5-S1.

## 2013-12-22 NOTE — Assessment & Plan Note (Signed)
Mild intermittent asthma with bronchitis Plan-educated on inhalers. Use Qvar regularly. Prescription for rescue inhaler. Stop Anoro.

## 2013-12-22 NOTE — Assessment & Plan Note (Signed)
Probably mild seasonal allergic rhinitis Plan-antihistamines

## 2014-01-26 ENCOUNTER — Other Ambulatory Visit: Payer: Self-pay | Admitting: Internal Medicine

## 2014-02-07 ENCOUNTER — Encounter: Payer: Self-pay | Admitting: Internal Medicine

## 2014-02-07 ENCOUNTER — Ambulatory Visit (INDEPENDENT_AMBULATORY_CARE_PROVIDER_SITE_OTHER): Payer: Medicare Other | Admitting: Internal Medicine

## 2014-02-07 VITALS — BP 140/80 | HR 68 | Ht 59.75 in | Wt 159.6 lb

## 2014-02-07 DIAGNOSIS — J45909 Unspecified asthma, uncomplicated: Secondary | ICD-10-CM | POA: Diagnosis not present

## 2014-02-07 DIAGNOSIS — R053 Chronic cough: Secondary | ICD-10-CM

## 2014-02-07 DIAGNOSIS — R05 Cough: Secondary | ICD-10-CM

## 2014-02-07 DIAGNOSIS — R059 Cough, unspecified: Secondary | ICD-10-CM

## 2014-02-07 MED ORDER — LEVALBUTEROL HCL 0.63 MG/3ML IN NEBU
0.6300 mg | INHALATION_SOLUTION | Freq: Once | RESPIRATORY_TRACT | Status: AC
  Administered 2014-02-07: 0.63 mg via RESPIRATORY_TRACT

## 2014-02-07 NOTE — Progress Notes (Signed)
08/13/13- 31 yoF former smoker referred courtesy of Dr McKeown-chronic cough/hoarseness  History of childhood asthma. Onset of cough around 1988 when she quit smoking. Told that she wheezes some. She denies any reflux or heartburn. Uses nebulizer once or twice a month but it makes her jittery and does not help the cough. Usually dry cough without shortness of breath. Triggers: Laughter, fragrances, cold air, lying down. Not food or drink. Helpful: Cough drops, sips of liquids. No benefit from Spiriva, albuterol, Advair. History sinusitis. Nasal surgery? Septoplasty. Denies reflux. 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 CXR 08/02/13- IMPRESSION:  No acute cardiopulmonary disease.  Electronically Signed  By: Abigail Miyamoto M.D.  On: 08/02/2013 18:12  09/28/13- 89 yoF former smoker referred courtesy of Dr McKeown-chronic cough/hoarseness FOLLOWS FOR: review PFT results and labs in detail with patient. No change in dry cough, not worse during sleep. Sips of liquids help. Nebulizer is little help. She has not found inhalers to make much difference. Allergy profile 08/13/2013-negative, total IgE 4.8 PFT 09/28/2013-normal spirometry flows with slight response to bronchodilator in small airways, normal lung volumes, diffusion mildly reduced. FVC 1.71/86%, FEV1 1.40/91%, FEV1/FVC 0.82, FEF 25-75% 0.82/101%, TLC 82%, DLCO 70%.  12/06/13- FOLLOWS FOR: Pt is using both Anoro and Qvar inhalers-not taking like she should-out of sight out of mind. Pt states she used both inhalers this AM and started coughing and threw up. Pt has been having trouble with her sinus as well. 2 weeks and increased chest congestion. Taking Zyrtec. Some nasal mucus. Benzonatate for cough. Laugh triggers coughing. Does not recognize reflux  02/07/14- 67 yoF former smoker referred courtesy of Dr McKeown-chronic cough/hoarseness Follows For:  Still dry cough - SOB with over exertion - runny nose, sneezing No real change  in cough or mild short of breath. Wheeze is fixated. Benzonatate little help. Continues Zyrtec. Trying inhalers. Not using nebulizer regularly. Stopped Anoro- no help.  ROS-see HPI Constitutional:   No-   weight loss, night sweats, fevers, chills, fatigue, lassitude. HEENT:   No-  headaches, difficulty swallowing, tooth/dental problems, sore throat,       No-  sneezing, itching, ear ache, +nasal congestion, post nasal drip,  CV:  No-chest pain, no-orthopnea, PND, swelling in lower extremities, anasarca, dizziness, palpitations Resp: No-   shortness of breath with exertion or at rest.              No-   productive cough,  + non-productive cough,  No- coughing up of blood.              No-   change in color of mucus.  No- wheezing.   Skin: No-   rash or lesions. GI:  No-   heartburn, indigestion, abdominal pain, nausea, vomiting,  GU:  MS:  No-   joint pain or swelling.   Neuro-     nothing unusual Psych:  No- change in mood or affect. No depression or anxiety.  No memory loss.  OBJ- Physical Exam General- Alert, Oriented, Affect-appropriate, Distress- none acute Skin- rash-none, lesions- none, excoriation- none Lymphadenopathy- none Head- atraumatic            Eyes- Gross vision intact, PERRLA, conjunctivae and secretions clear            Ears- Hearing, canals-normal            Nose- Clear, no-Septal dev, mucus, polyps, erosion, perforation             Throat- Mallampati II , mucosa clear ,  drainage- none, tonsils- atrophic. + dentures Neck- flexible , trachea midline, no stridor , thyroid nl, carotid no bruit Chest - symmetrical excursion , unlabored           Heart/CV- RRR , no murmur , no gallop  , no rub, nl s1 s2                           - JVD- none , edema- none, stasis changes- none, varices- none           Lung- clear to P&A, wheeze- none, cough+triggered by deep breath today, dullness-none,                           rub- none           Chest wall-  Abd-  Br/ Gen/ Rectal- Not  done, not indicated Extrem- cyanosis- none, clubbing, none, atrophy- none, strength- nl Neuro- grossly intact to observation

## 2014-02-07 NOTE — Patient Instructions (Signed)
Neb xop 0.63  Ok to use up the [employer] to try again with the benzonatate perles for cough

## 2014-03-15 ENCOUNTER — Ambulatory Visit (INDEPENDENT_AMBULATORY_CARE_PROVIDER_SITE_OTHER): Payer: Medicare Other | Admitting: Physician Assistant

## 2014-03-15 ENCOUNTER — Encounter: Payer: Self-pay | Admitting: Physician Assistant

## 2014-03-15 VITALS — BP 138/82 | HR 76 | Temp 98.1°F | Resp 16 | Wt 159.0 lb

## 2014-03-15 DIAGNOSIS — I10 Essential (primary) hypertension: Secondary | ICD-10-CM

## 2014-03-15 DIAGNOSIS — E559 Vitamin D deficiency, unspecified: Secondary | ICD-10-CM | POA: Diagnosis not present

## 2014-03-15 DIAGNOSIS — E119 Type 2 diabetes mellitus without complications: Secondary | ICD-10-CM | POA: Diagnosis not present

## 2014-03-15 DIAGNOSIS — E785 Hyperlipidemia, unspecified: Secondary | ICD-10-CM

## 2014-03-15 DIAGNOSIS — Z79899 Other long term (current) drug therapy: Secondary | ICD-10-CM

## 2014-03-15 LAB — CBC WITH DIFFERENTIAL/PLATELET
Basophils Absolute: 0 10*3/uL (ref 0.0–0.1)
Basophils Relative: 1 % (ref 0–1)
EOS PCT: 3 % (ref 0–5)
Eosinophils Absolute: 0.1 10*3/uL (ref 0.0–0.7)
HEMATOCRIT: 38.1 % (ref 36.0–46.0)
HEMOGLOBIN: 12.6 g/dL (ref 12.0–15.0)
LYMPHS ABS: 1.8 10*3/uL (ref 0.7–4.0)
Lymphocytes Relative: 42 % (ref 12–46)
MCH: 27 pg (ref 26.0–34.0)
MCHC: 33.1 g/dL (ref 30.0–36.0)
MCV: 81.8 fL (ref 78.0–100.0)
MONOS PCT: 8 % (ref 3–12)
Monocytes Absolute: 0.3 10*3/uL (ref 0.1–1.0)
Neutro Abs: 2 10*3/uL (ref 1.7–7.7)
Neutrophils Relative %: 46 % (ref 43–77)
PLATELETS: 303 10*3/uL (ref 150–400)
RBC: 4.66 MIL/uL (ref 3.87–5.11)
RDW: 14.7 % (ref 11.5–15.5)
WBC: 4.3 10*3/uL (ref 4.0–10.5)

## 2014-03-15 LAB — HEMOGLOBIN A1C
Hgb A1c MFr Bld: 7.5 % — ABNORMAL HIGH (ref <5.7)
MEAN PLASMA GLUCOSE: 169 mg/dL — AB (ref <117)

## 2014-03-15 MED ORDER — HYDROCORTISONE ACETATE 25 MG RE SUPP: 25.0000 mg | Freq: Two times a day (BID) | RECTAL | Status: DC

## 2014-03-15 NOTE — Progress Notes (Signed)
Assessment and Plan:  Hypertension: Continue medication, monitor blood pressure at home. Continue DASH diet. Cholesterol: Continue diet and exercise. Check cholesterol.  Diabetes-Continue diet and exercise. Check A1C Vitamin D Def- check level and continue medications.  Anal leakage- normal tone, questionable internal hemorrhiods, get on bulk forming agent, check labs, can follow up with GI if continues.  Atypical CP- ? If pulmonary or musculoskeletal pain- will try advil, stretches, and will continue follow up with Dr. Annamaria Boots  Continue diet and meds as discussed. Further disposition pending results of labs. Discussed med's effects and SE's.    HPI 68 y.o. female  presents for 3 month follow up with hypertension, hyperlipidemia, diabetes and vitamin D. Her blood pressure has been controlled at home, today their BP is BP: 138/82 mmHg She does workout. She denies chest pain, shortness of breath, dizziness.  She is on cholesterol medication and denies myalgias. Her cholesterol is at goal. The cholesterol last visit was:   Lab Results  Component Value Date   CHOL 137 12/09/2013   HDL 39* 12/09/2013   LDLCALC 81 12/09/2013   TRIG 84 12/09/2013   CHOLHDL 3.5 12/09/2013   She has been working on diet and exercise for Diabetes, and denies polydipsia, polyuria and visual disturbances. She does have paresthesias that are controled with medication. Last A1C in the office was:  Lab Results  Component Value Date   HGBA1C 7.0* 12/09/2013   Patient is on Vitamin D supplement.   Wt Readings from Last 3 Encounters:  03/15/14 159 lb (72.122 kg)  02/07/14 159 lb 9.6 oz (72.394 kg)  12/09/13 155 lb (70.308 kg)   She sees Dr. Annamaria Boots for asthma/chronic cough.  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 Denies SOB, CP, nausea, sweating, dizziness.  Slightly pleuritic, worse when moving/taking deep breath.  She has had a normal Echo and Myoview stress test 08/2013.  She will have occ anal leakage for the past 3-4 months. Denies constipation or diarrhea but occ she has some diarrhea and she does have a history of hemorrhoids.   Current Medications:  Current Outpatient Prescriptions on File Prior to Visit  Medication Sig Dispense Refill  . albuterol (PROVENTIL HFA;VENTOLIN HFA) 108 (90 BASE) MCG/ACT inhaler 2 puffs every 6 hours if needed- rescue inhaler  1 Inhaler  prn  . aspirin 81 MG chewable tablet Chew 324 mg by mouth daily. Take 2 daily until seen by [facility] in Dec.      . atorvastatin (LIPITOR) 40 MG tablet Take 40 mg by mouth daily.      . benzonatate (TESSALON) 200 MG capsule Take 1 capsule (200 mg total) by mouth 3 (three) times daily as needed for cough.  30 capsule  1  . budesonide (PULMICORT) 0.5 MG/2ML nebulizer solution Take 0.5 mg by nebulization 2 (two) times daily.      . carisoprodol (SOMA) 250 MG tablet TAKE 1 TABLET BY MOUTH EVERY 6 HOURS AS NEEDED  180 tablet  0  . cetirizine (ZYRTEC) 10 MG tablet Take 10 mg by mouth daily.      . Cholecalciferol 5000 UNITS TABS Take 5,000-10,000 Units by mouth daily. Monday-Fridays =10,000iu Sat and Sun = 5,000iu      . cyanocobalamin (,VITAMIN B-12,) 1000 MCG/ML injection Inject 1,000 mcg into the muscle every 30 (thirty) days.      Marland Kitchen estrogen, conjugated,-medroxyprogesterone (PREMPRO) 0.3-1.5 MG per tablet Take 1  tablet by mouth daily.      Marland Kitchen FLUoxetine (PROZAC) 20 MG capsule Take 20 mg by mouth 2 (two) times daily.      . fluticasone (FLOVENT HFA) 110 MCG/ACT inhaler Inhale 2 puffs into the lungs 2 (two) times daily. RINSE MOUTH AFTER USE  1 Inhaler  11  . hydrochlorothiazide (HYDRODIURIL) 25 MG tablet Take 25 mg by mouth daily.      Marland Kitchen ipratropium-albuterol (DUONEB) 0.5-2.5 (3) MG/3ML SOLN Take 3 mLs by nebulization every 6 (six) hours as needed.      Marland Kitchen losartan (COZAAR) 100 MG tablet Take 100 mg by mouth daily.      . Magnesium 500 MG TABS  Take 500 mg by mouth daily.       . metFORMIN (GLUCOPHAGE) 500 MG tablet Take 500 mg by mouth daily with breakfast.       . metoprolol (TOPROL-XL) 200 MG 24 hr tablet Take 200 mg by mouth daily.      . Misc Natural Products (OSTEO BI-FLEX JOINT SHIELD PO) Take by mouth.      . Umeclidinium-Vilanterol (ANORO ELLIPTA) 62.5-25 MCG/INH AEPB Inhale 1 puff into the lungs daily.  30 each  11   No current facility-administered medications on file prior to visit.   Medical History:  Past Medical History  Diagnosis Date  . Hypertension   . Hyperlipidemia   . Diabetes mellitus without complication   . GERD (gastroesophageal reflux disease)   . Anxiety   . Depression   . Anemia   . IBS (irritable bowel syndrome)   . Vitamin D deficiency   . Cancer 1995    Colon Cancer  . Arrhythmia    Allergies:  Allergies  Allergen Reactions  . Codeine Itching     Review of Systems: [X]  = complains of  [ ]  = denies  General: Fatigue [ ]  Fever [ ]  Chills [ ]  Weakness [ ]   Insomnia [ ]  Eyes: Redness [ ]  Blurred vision [ ]  Diplopia [ ]   ENT: Congestion [ ]  Sinus Pain [ ]  Post Nasal Drip [ ]  Sore Throat [ ]  Earache [ ]   Cardiac: Chest pain/pressure [ ]  SOB [ ]  Orthopnea [ ]   Palpitations [ ]   Paroxysmal nocturnal dyspnea[ ]  Claudication [ ]  Edema [ ]   Pulmonary: Cough [ ]  Wheezing[ ]   SOB [ ]   Snoring [ ]   GI: Nausea [ ]  Vomiting[ ]  Dysphagia[ ]  Heartburn[ ]  Abdominal pain [ ]  Constipation [ ] ; Diarrhea [ ] ; BRBPR [ ]  Melena[ ]  GU: Hematuria[ ]  Dysuria [ ]  Nocturia[ ]  Urgency [ ]   Hesitancy [ ]  Discharge [ ]  Neuro: Headaches[ ]  Vertigo[ ]  Paresthesias[ ]  Spasm [ ]  Speech changes [ ]  Incoordination [ ]   Ortho: Arthritis [ ]  Joint pain [ ]  Muscle pain [ ]  Joint swelling [ ]  Back Pain [ ]  Skin:  Rash [ ]   Pruritis [ ]  Change in skin lesion [ ]   Psych: Depression[ ]  Anxiety[ ]  Confusion [ ]  Memory loss [ ]   Heme/Lypmh: Bleeding [ ]  Bruising [ ]  Enlarged lymph nodes [ ]   Endocrine: Visual blurring [ ]   Paresthesia [ ]  Polyuria [ ]  Polydypsea [ ]    Heat/cold intolerance [ ]  Hypoglycemia [ ]   Family history- Review and unchanged Social history- Review and unchanged Physical Exam: BP 138/82  Pulse 76  Temp(Src) 98.1 F (36.7 C)  Resp 16  Wt 159 lb (72.122 kg) Wt Readings from Last 3 Encounters:  03/15/14 159 lb (72.122  kg)  02/07/14 159 lb 9.6 oz (72.394 kg)  12/09/13 155 lb (70.308 kg)   General Appearance: Well nourished, in no apparent distress. Eyes: PERRLA, EOMs, conjunctiva no swelling or erythema Sinuses: No Frontal/maxillary tenderness ENT/Mouth: Ext aud canals clear, TMs without erythema, bulging. No erythema, swelling, or exudate on post pharynx.  Tonsils not swollen or erythematous. Hearing normal.  Neck: Supple, thyroid normal.  Respiratory: Respiratory effort normal, BS equal bilaterally without rales, rhonchi, wheezing or stridor.  Cardio: RRR with no MRGs. Brisk peripheral pulses without edema.  Abdomen: Soft, + BS.  Non tender, no guarding, rebound, hernias, masses. Rectal exam: negative without mass, lesions or tenderness, internal hemorrhoids noted, sphincter tone normal. Lymphatics: Non tender without lymphadenopathy.  Musculoskeletal: Full ROM, 5/5 strength, normal gait.  Skin: Warm, dry without rashes, lesions, ecchymosis.  Neuro: Cranial nerves intact. No cerebellar symptoms. Sensation intact.  Psych: Awake and oriented X 3, normal affect, Insight and Judgment appropriate.    Vicie Mutters 1:33 PM

## 2014-03-15 NOTE — Patient Instructions (Signed)
Use a dropper to put olive oil or canola oil in the effected ear- 2-3 times a week. Let it soak for 20-30 min then you can take a shower or use a baby bulb with warm water to wash out the ear wax.  Do not use Qtips Sitz baths Suppository  Hemorrhoids Hemorrhoids are swollen veins around the rectum or anus. There are two types of hemorrhoids:   Internal hemorrhoids. These occur in the veins just inside the rectum. They may poke through to the outside and become irritated and painful.  External hemorrhoids. These occur in the veins outside the anus and can be felt as a painful swelling or hard lump near the anus. CAUSES  Pregnancy.   Obesity.   Constipation or diarrhea.   Straining to have a bowel movement.   Sitting for long periods on the toilet.  Heavy lifting or other activity that caused you to strain.  Anal intercourse. SYMPTOMS   Pain.   Anal itching or irritation.   Rectal bleeding.   Fecal leakage.   Anal swelling.   One or more lumps around the anus.  DIAGNOSIS  Your caregiver may be able to diagnose hemorrhoids by visual examination. Other examinations or tests that may be performed include:   Examination of the rectal area with a gloved hand (digital rectal exam).   Examination of anal canal using a small tube (scope).   A blood test if you have lost a significant amount of blood.  A test to look inside the colon (sigmoidoscopy or colonoscopy). TREATMENT Most hemorrhoids can be treated at home. However, if symptoms do not seem to be getting better or if you have a lot of rectal bleeding, your caregiver may perform a procedure to help make the hemorrhoids get smaller or remove them completely. Possible treatments include:   Placing a rubber band at the base of the hemorrhoid to cut off the circulation (rubber band ligation).   Injecting a chemical to shrink the hemorrhoid (sclerotherapy).   Using a tool to burn the hemorrhoid (infrared  light therapy).   Surgically removing the hemorrhoid (hemorrhoidectomy).   Stapling the hemorrhoid to block blood flow to the tissue (hemorrhoid stapling).  HOME CARE INSTRUCTIONS   Eat foods with fiber, such as whole grains, beans, nuts, fruits, and vegetables. Ask your doctor about taking products with added fiber in them (fibersupplements).  Increase fluid intake. Drink enough water and fluids to keep your urine clear or pale yellow.   Exercise regularly.   Go to the bathroom when you have the urge to have a bowel movement. Do not wait.   Avoid straining to have bowel movements.   Keep the anal area dry and clean. Use wet toilet paper or moist towelettes after a bowel movement.   Medicated creams and suppositories may be used or applied as directed.   Only take over-the-counter or prescription medicines as directed by your caregiver.   Take warm sitz baths for 15-20 minutes, 3-4 times a day to ease pain and discomfort.   Place ice packs on the hemorrhoids if they are tender and swollen. Using ice packs between sitz baths may be helpful.   Put ice in a plastic bag.   Place a towel between your skin and the bag.   Leave the ice on for 15-20 minutes, 3-4 times a day.   Do not use a donut-shaped pillow or sit on the toilet for long periods. This increases blood pooling and pain.  [place]  IF:  You have increasing pain and swelling that is not controlled by treatment or medicine.  You have uncontrolled bleeding.  You have difficulty or you are unable to have a bowel movement.  You have pain or inflammation outside the area of the hemorrhoids. MAKE SURE YOU:  Understand these instructions.  Will watch your condition.  Will get help right away if you are not doing well or get worse. Document Released: 09/06/2000 Document Revised: 08/26/2012 Document Reviewed: 07/14/2012 [facility] Patient Information 2015 [place], [place]. This information is not  intended to replace advice given to you by your health care provider. Make sure you discuss any questions you have with your health care provider.

## 2014-03-16 LAB — LIPID PANEL
CHOLESTEROL: 158 mg/dL (ref 0–200)
HDL: 45 mg/dL (ref >39)
LDL Cholesterol: 88 mg/dL (ref 0–99)
Total CHOL/HDL Ratio: 3.5 Ratio
Triglycerides: 127 mg/dL (ref <150)
VLDL: 25 mg/dL (ref 0–40)

## 2014-03-16 LAB — HEPATIC FUNCTION PANEL
ALBUMIN: 3.8 g/dL (ref 3.5–5.2)
ALT: 16 U/L (ref 0–35)
AST: 22 U/L (ref 0–37)
Alkaline Phosphatase: 99 U/L (ref 39–117)
Bilirubin, Direct: 0.1 mg/dL (ref 0.0–0.3)
Indirect Bilirubin: 0.3 mg/dL (ref 0.2–1.2)
Total Bilirubin: 0.4 mg/dL (ref 0.2–1.2)
Total Protein: 6.8 g/dL (ref 6.0–8.3)

## 2014-03-16 LAB — BASIC METABOLIC PANEL WITH GFR
BUN: 10 mg/dL (ref 6–23)
CO2: 28 meq/L (ref 19–32)
CREATININE: 0.81 mg/dL (ref 0.50–1.10)
Calcium: 9.5 mg/dL (ref 8.4–10.5)
Chloride: 100 mEq/L (ref 96–112)
GFR, Est African American: 87 mL/min
GFR, Est Non African American: 75 mL/min
GLUCOSE: 150 mg/dL — AB (ref 70–99)
Potassium: 4.4 mEq/L (ref 3.5–5.3)
Sodium: 137 mEq/L (ref 135–145)

## 2014-03-16 LAB — TSH: TSH: 1.022 u[IU]/mL (ref 0.350–4.500)

## 2014-03-16 LAB — MAGNESIUM: Magnesium: 1.8 mg/dL (ref 1.5–2.5)

## 2014-03-16 LAB — INSULIN, FASTING: INSULIN FASTING, SERUM: 57 u[IU]/mL — AB (ref 3–28)

## 2014-03-21 ENCOUNTER — Ambulatory Visit: Payer: Medicare Other | Admitting: Internal Medicine

## 2014-03-21 NOTE — Assessment & Plan Note (Addendum)
Mild chronic bronchitis or asthmatic bronchitis Plan-nebulizer Xopenex

## 2014-05-10 ENCOUNTER — Other Ambulatory Visit: Payer: Self-pay | Admitting: Physician Assistant

## 2014-05-23 DIAGNOSIS — M25579 Pain in unspecified ankle and joints of unspecified foot: Secondary | ICD-10-CM | POA: Diagnosis not present

## 2014-06-15 ENCOUNTER — Encounter: Payer: Self-pay | Admitting: Physician Assistant

## 2014-06-15 ENCOUNTER — Ambulatory Visit (INDEPENDENT_AMBULATORY_CARE_PROVIDER_SITE_OTHER): Payer: Medicare Other | Admitting: Physician Assistant

## 2014-06-15 VITALS — BP 138/80 | HR 80 | Temp 98.2°F | Resp 16 | Ht 60.0 in | Wt 155.0 lb

## 2014-06-15 DIAGNOSIS — Z79899 Other long term (current) drug therapy: Secondary | ICD-10-CM

## 2014-06-15 DIAGNOSIS — E119 Type 2 diabetes mellitus without complications: Secondary | ICD-10-CM | POA: Diagnosis not present

## 2014-06-15 DIAGNOSIS — E785 Hyperlipidemia, unspecified: Secondary | ICD-10-CM | POA: Diagnosis not present

## 2014-06-15 DIAGNOSIS — R059 Cough, unspecified: Secondary | ICD-10-CM

## 2014-06-15 DIAGNOSIS — L819 Disorder of pigmentation, unspecified: Secondary | ICD-10-CM | POA: Diagnosis not present

## 2014-06-15 DIAGNOSIS — I10 Essential (primary) hypertension: Secondary | ICD-10-CM | POA: Diagnosis not present

## 2014-06-15 DIAGNOSIS — R05 Cough: Secondary | ICD-10-CM | POA: Diagnosis not present

## 2014-06-15 DIAGNOSIS — E559 Vitamin D deficiency, unspecified: Secondary | ICD-10-CM

## 2014-06-15 LAB — CBC WITH DIFFERENTIAL/PLATELET
Basophils Absolute: 0 10*3/uL (ref 0.0–0.1)
Basophils Relative: 0 % (ref 0–1)
Eosinophils Absolute: 0.1 10*3/uL (ref 0.0–0.7)
Eosinophils Relative: 2 % (ref 0–5)
HCT: 36.7 % (ref 36.0–46.0)
Hemoglobin: 12.3 g/dL (ref 12.0–15.0)
LYMPHS PCT: 49 % — AB (ref 12–46)
Lymphs Abs: 2.3 10*3/uL (ref 0.7–4.0)
MCH: 27.6 pg (ref 26.0–34.0)
MCHC: 33.5 g/dL (ref 30.0–36.0)
MCV: 82.5 fL (ref 78.0–100.0)
MONOS PCT: 8 % (ref 3–12)
Monocytes Absolute: 0.4 10*3/uL (ref 0.1–1.0)
NEUTROS PCT: 41 % — AB (ref 43–77)
Neutro Abs: 1.9 10*3/uL (ref 1.7–7.7)
PLATELETS: 265 10*3/uL (ref 150–400)
RBC: 4.45 MIL/uL (ref 3.87–5.11)
RDW: 15.3 % (ref 11.5–15.5)
WBC: 4.6 10*3/uL (ref 4.0–10.5)

## 2014-06-15 LAB — BASIC METABOLIC PANEL WITH GFR
BUN: 9 mg/dL (ref 6–23)
CHLORIDE: 103 meq/L (ref 96–112)
CO2: 28 meq/L (ref 19–32)
Calcium: 9.5 mg/dL (ref 8.4–10.5)
Creat: 0.72 mg/dL (ref 0.50–1.10)
GFR, Est African American: >89 mL/min
GFR, Est Non African American: 87 mL/min
GLUCOSE: 108 mg/dL — AB (ref 70–99)
Potassium: 4.2 mEq/L (ref 3.5–5.3)
Sodium: 141 mEq/L (ref 135–145)

## 2014-06-15 LAB — IRON AND TIBC
%SAT: 12 % — AB (ref 20–55)
Iron: 36 ug/dL — ABNORMAL LOW (ref 42–145)
TIBC: 312 ug/dL (ref 250–470)
UIBC: 276 ug/dL (ref 125–400)

## 2014-06-15 LAB — HEPATIC FUNCTION PANEL
ALT: 13 U/L (ref 0–35)
AST: 20 U/L (ref 0–37)
Albumin: 4.2 g/dL (ref 3.5–5.2)
Alkaline Phosphatase: 89 U/L (ref 39–117)
Bilirubin, Direct: 0.1 mg/dL (ref 0.0–0.3)
Indirect Bilirubin: 0.3 mg/dL (ref 0.2–1.2)
TOTAL PROTEIN: 7.3 g/dL (ref 6.0–8.3)
Total Bilirubin: 0.4 mg/dL (ref 0.2–1.2)

## 2014-06-15 LAB — TSH: TSH: 1.645 u[IU]/mL (ref 0.350–4.500)

## 2014-06-15 LAB — LIPID PANEL
CHOLESTEROL: 184 mg/dL (ref 0–200)
HDL: 45 mg/dL (ref >39)
LDL Cholesterol: 97 mg/dL (ref 0–99)
Total CHOL/HDL Ratio: 4.1 Ratio
Triglycerides: 208 mg/dL — ABNORMAL HIGH (ref <150)
VLDL: 42 mg/dL — ABNORMAL HIGH (ref 0–40)

## 2014-06-15 LAB — FERRITIN: Ferritin: 61 ng/mL (ref 10–291)

## 2014-06-15 LAB — MAGNESIUM: MAGNESIUM: 2.1 mg/dL (ref 1.5–2.5)

## 2014-06-15 NOTE — Progress Notes (Signed)
Assessment and Plan:  Hypertension: Continue medication, monitor blood pressure at home. Continue DASH diet. Cholesterol: Continue diet and exercise. Check cholesterol.  Diabetes-Continue diet and exercise. Check A1C Vitamin D Def- check level and continue medications.  Abnormal pigmentation check LFTs, check ACTH, cortisol, iron, ACE  Continue diet and meds as discussed. Further disposition pending results of labs. Discussed med's effects and SE's.    HPI 68 y.o. female  presents for 3 month follow up with hypertension, hyperlipidemia, diabetes and vitamin D. Her blood pressure has been controlled at home, today their BP is BP: 138/80 mmHg She does not workout. She denies chest pain, shortness of breath, dizziness.  She is on cholesterol medication and denies myalgias. Her cholesterol is at goal. The cholesterol last visit was:   Lab Results  Component Value Date   CHOL 158 03/15/2014   HDL 45 03/15/2014   LDLCALC 88 03/15/2014   TRIG 127 03/15/2014   CHOLHDL 3.5 03/15/2014   She has been working on diet and exercise for Diabetes, and denies paresthesia of the feet, polydipsia and polyuria. Last A1C in the office was:  Lab Results  Component Value Date   HGBA1C 7.5* 03/15/2014   Patient is on Vitamin D supplement. She states since Sunday, she had dark yellow on palm of left hand only. Some stomach gerd, cough.  Current Medications:  Current Outpatient Prescriptions on File Prior to Visit  Medication Sig Dispense Refill  . albuterol (PROVENTIL HFA;VENTOLIN HFA) 108 (90 BASE) MCG/ACT inhaler 2 puffs every 6 hours if needed- rescue inhaler  1 Inhaler  prn  . aspirin 81 MG chewable tablet Chew 324 mg by mouth daily. Take 2 daily until [facility] in Dec.      . atorvastatin (LIPITOR) 40 MG tablet Take 40 mg by mouth daily.      . benzonatate (TESSALON) 200 MG capsule Take 1 capsule (200 mg total) by mouth 3 (three) times daily as needed for cough.  30 capsule  1  . budesonide  (PULMICORT) 0.5 MG/2ML nebulizer solution Take 0.5 mg by nebulization 2 (two) times daily.      . carisoprodol (SOMA) 250 MG tablet TAKE 1 TABLET EVERY 6 HOURS AS NEEDED  180 tablet  0  . cetirizine (ZYRTEC) 10 MG tablet Take 10 mg by mouth daily.      . Cholecalciferol 5000 UNITS TABS Take 5,000-10,000 Units by mouth daily. Monday-Fridays =10,000iu Sat and Sun = 5,000iu      . cyanocobalamin (,VITAMIN B-12,) 1000 MCG/ML injection Inject 1,000 mcg into the muscle every 30 (thirty) days.      Marland Kitchen estrogen, conjugated,-medroxyprogesterone (PREMPRO) 0.3-1.5 MG per tablet Take 1 tablet by mouth daily.      Marland Kitchen FLUoxetine (PROZAC) 20 MG capsule Take 20 mg by mouth 2 (two) times daily.      . fluticasone (FLOVENT HFA) 110 MCG/ACT inhaler Inhale 2 puffs into the lungs 2 (two) times daily. RINSE MOUTH AFTER USE  1 Inhaler  11  . hydrochlorothiazide (HYDRODIURIL) 25 MG tablet Take 25 mg by mouth daily.      . hydrocortisone (ANUSOL-HC) 25 MG suppository Place 1 suppository (25 mg total) rectally 2 (two) times daily.  12 suppository  0  . ipratropium-albuterol (DUONEB) 0.5-2.5 (3) MG/3ML SOLN Take 3 mLs by nebulization every 6 (six) hours as needed.      Marland Kitchen losartan (COZAAR) 100 MG tablet Take 100 mg by mouth daily.      . Magnesium 500 MG TABS Take  500 mg by mouth daily.       . metFORMIN (GLUCOPHAGE) 500 MG tablet Take 500 mg by mouth daily with breakfast.       . metoprolol (TOPROL-XL) 200 MG 24 hr tablet Take 200 mg by mouth daily.      . Misc Natural Products (OSTEO BI-FLEX JOINT SHIELD PO) Take by mouth.      . Umeclidinium-Vilanterol (ANORO ELLIPTA) 62.5-25 MCG/INH AEPB Inhale 1 puff into the lungs daily.  30 each  11   No current facility-administered medications on file prior to visit.   Medical History:  Past Medical History  Diagnosis Date  . Hypertension   . Hyperlipidemia   . Diabetes mellitus without complication   . GERD (gastroesophageal reflux disease)   . Anxiety   . Depression   .  Anemia   . IBS (irritable bowel syndrome)   . Vitamin D deficiency   . Cancer 1995    Colon Cancer  . Arrhythmia    Allergies:  Allergies  Allergen Reactions  . Codeine Itching     Review of Systems: [X]  = complains of  [ ]  = denies  General: Fatigue [ ]  Fever [ ]  Chills [ ]  Weakness [ ]   Insomnia [ ]  Eyes: Redness [ ]  Blurred vision [ ]  Diplopia [ ]   ENT: Congestion [ ]  Sinus Pain [ ]  Post Nasal Drip [ ]  Sore Throat [ ]  Earache [ ]   Cardiac: Chest pain/pressure [ ]  SOB [ ]  Orthopnea [ ]   Palpitations [ ]   Paroxysmal nocturnal dyspnea[ ]  Claudication [ ]  Edema [ ]   Pulmonary: Cough [ ]  Wheezing[ ]   SOB [ ]   Snoring [ ]   GI: Nausea [ ]  Vomiting[ ]  Dysphagia[ ]  Heartburn[ ]  Abdominal pain [ ]  Constipation [ ] ; Diarrhea [ ] ; BRBPR [ ]  Melena[ ]  GU: Hematuria[ ]  Dysuria [ ]  Nocturia[ ]  Urgency [ ]   Hesitancy [ ]  Discharge [ ]  Neuro: Headaches[ ]  Vertigo[ ]  Paresthesias[ ]  Spasm [ ]  Speech changes [ ]  Incoordination [ ]   Ortho: Arthritis [ ]  Joint pain [ ]  Muscle pain [ ]  Joint swelling [ ]  Back Pain [ ]  Skin:  Rash [ ]   Pruritis [ ]  Change in skin lesion [ ]   Psych: Depression[ ]  Anxiety[ ]  Confusion [ ]  Memory loss [ ]   Heme/Lypmh: Bleeding [ ]  Bruising [ ]  Enlarged lymph nodes [ ]   Endocrine: Visual blurring [ ]  Paresthesia [ ]  Polyuria [ ]  Polydypsea [ ]    Heat/cold intolerance [ ]  Hypoglycemia [ ]   Family history- Review and unchanged Social history- Review and unchanged Physical Exam: BP 138/80  Pulse 80  Temp(Src) 98.2 F (36.8 C)  Resp 16  Ht 5' (1.524 m)  Wt 155 lb (70.308 kg)  BMI 30.27 kg/m2 Wt Readings from Last 3 Encounters:  06/15/14 155 lb (70.308 kg)  03/15/14 159 lb (72.122 kg)  02/07/14 159 lb 9.6 oz (72.394 kg)   General Appearance: Well nourished, in no apparent distress. Eyes: PERRLA, EOMs, conjunctiva no swelling or erythema Sinuses: No Frontal/maxillary tenderness ENT/Mouth: Ext aud canals clear, TMs without erythema, bulging. No erythema, swelling,  or exudate on post pharynx.  Tonsils not swollen or erythematous. Hearing normal.  Neck: Supple, thyroid normal.  Respiratory: Respiratory effort normal, BS equal bilaterally without rales, rhonchi, wheezing or stridor.  Cardio: RRR with no MRGs. Brisk peripheral pulses without edema.  Abdomen: Soft, + BS.  Non tender, no guarding, rebound, hernias, masses. Lymphatics: Non tender without lymphadenopathy.  Musculoskeletal: Full ROM, 5/5 strength, normal gait.  Skin: Warm, dry without rashes, lesions, ecchymosis. Left hand with orange non blanchable appearance on palmar side without any other jaundice.  Neuro: Cranial nerves intact. No cerebellar symptoms. Sensation intact.  Psych: Awake and oriented X 3, normal affect, Insight and Judgment appropriate.    Vicie Mutters 4:20 PM

## 2014-06-15 NOTE — Patient Instructions (Signed)

## 2014-06-16 LAB — ANGIOTENSIN CONVERTING ENZYME: Angiotensin-Converting Enzyme: 56 U/L — ABNORMAL HIGH (ref 8–52)

## 2014-06-16 LAB — HEMOGLOBIN A1C
Hgb A1c MFr Bld: 7.1 % — ABNORMAL HIGH (ref <5.7)
MEAN PLASMA GLUCOSE: 157 mg/dL — AB (ref <117)

## 2014-06-16 LAB — CORTISOL: CORTISOL PLASMA: 4.5 ug/dL

## 2014-06-17 LAB — ACTH: C206 ACTH: 13 pg/mL (ref 6–50)

## 2014-06-22 ENCOUNTER — Ambulatory Visit: Payer: Self-pay | Admitting: Internal Medicine

## 2014-07-06 ENCOUNTER — Other Ambulatory Visit: Payer: Self-pay | Admitting: Internal Medicine

## 2014-07-06 DIAGNOSIS — Z1231 Encounter for screening mammogram for malignant neoplasm of breast: Secondary | ICD-10-CM

## 2014-07-11 ENCOUNTER — Ambulatory Visit (HOSPITAL_COMMUNITY): Admission: RE | Admit: 2014-07-11 | Payer: TRICARE For Life (TFL) | Source: Ambulatory Visit

## 2014-07-12 ENCOUNTER — Ambulatory Visit (HOSPITAL_COMMUNITY)
Admission: RE | Admit: 2014-07-12 | Discharge: 2014-07-12 | Disposition: A | Discharge status: Home / Self Care | Payer: Medicare Other | Source: Ambulatory Visit | Attending: Internal Medicine | Admitting: Internal Medicine

## 2014-07-12 DIAGNOSIS — Z1231 Encounter for screening mammogram for malignant neoplasm of breast: Secondary | ICD-10-CM | POA: Diagnosis not present

## 2014-08-01 ENCOUNTER — Ambulatory Visit (INDEPENDENT_AMBULATORY_CARE_PROVIDER_SITE_OTHER): Payer: Medicare Other | Admitting: *Deleted

## 2014-08-01 DIAGNOSIS — Z23 Encounter for immunization: Secondary | ICD-10-CM

## 2014-08-24 ENCOUNTER — Other Ambulatory Visit: Payer: Self-pay | Admitting: *Deleted

## 2014-08-24 ENCOUNTER — Ambulatory Visit (INDEPENDENT_AMBULATORY_CARE_PROVIDER_SITE_OTHER): Payer: Medicare Other | Admitting: Internal Medicine

## 2014-08-24 ENCOUNTER — Encounter: Payer: Self-pay | Admitting: Internal Medicine

## 2014-08-24 VITALS — BP 170/90 | HR 67 | Ht 59.5 in | Wt 158.5 lb

## 2014-08-24 DIAGNOSIS — I10 Essential (primary) hypertension: Secondary | ICD-10-CM | POA: Diagnosis not present

## 2014-08-24 DIAGNOSIS — R0789 Other chest pain: Secondary | ICD-10-CM | POA: Diagnosis not present

## 2014-08-24 DIAGNOSIS — E785 Hyperlipidemia, unspecified: Secondary | ICD-10-CM

## 2014-08-24 DIAGNOSIS — Z9114 Patient's other noncompliance with medication regimen: Secondary | ICD-10-CM | POA: Diagnosis not present

## 2014-08-24 MED ORDER — ATORVASTATIN CALCIUM 40 MG PO TABS: 40.0000 mg | ORAL_TABLET | Freq: Every day | ORAL | Status: DC

## 2014-08-24 MED ORDER — LOSARTAN POTASSIUM 100 MG PO TABS: 100.0000 mg | ORAL_TABLET | Freq: Every day | ORAL | Status: DC

## 2014-08-24 MED ORDER — FLUOXETINE HCL 20 MG PO CAPS: 20.0000 mg | ORAL_CAPSULE | Freq: Two times a day (BID) | ORAL | Status: DC

## 2014-08-24 MED ORDER — METOPROLOL SUCCINATE ER 200 MG PO TB24: 200.0000 mg | ORAL_TABLET | Freq: Every day | ORAL | Status: DC

## 2014-08-24 NOTE — Progress Notes (Signed)
OFFICE NOTE  Chief Complaint:  Chest pain, left arm heaviness  Primary Care Physician: Alesia Richards, MD  HPI:  Nancy Myers this pleasant 68 year old female who was recently seen in the hospital for several episodes of left sided chest pain. 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 She developed left-sided chest heaviness that went to her arm and associated arm weakness for which she could not lift her arm above her shoulder. These episodes lasted for only a few seconds to a minute. She did not have any speech difficulty, facial droop, confusion or drooling associated with this. The chest heaviness does not radiate to her neck back or shoulder. Was described as a 36 or 9/10 in severity. She does have a family history significant for heart attack in her father age 34 and brother at age 62 and her mother had a stroke at age 74. Just as diabetes, hypertension which is not well controlled, dyslipidemia and asthma with recent exacerbations.  She reports finally another episode of chest discomfort that made her go to the hospital, and this was also precipitated by an uncle of her's who recently had sudden cardiac death secondary to an MI.  Also she reported about 40 years ago being told that she had a heart murmur but has not ever had an evaluation at that.  Just reports having had a plain exercise treadmill stress test 2 years ago at [employer] office, but never saw a cardiologist.  At her last office visit I recommended checking a stress test and an echocardiogram. She underwent both of those in our office. The echocardiogram showed a preserved EF of 55-60%, with mild aortic insufficiency and mild mitral regurgitation. Her stress test was normal, demonstrating no evidence of ischemia and an EF of 74%. She denies any further scintillations.  She returns today for follow-up and is markedly hypertensive blood pressure 170/90. She says she has not  taken her blood pressure medicines. I'm concerned about her lack of compliance. I reiterated the fact that long-standing hypertension is associated with increased risk of stroke, which is in her family, as well as progression of heart failure, visual loss and kidney dysfunction. She says that she does have medications and is not in need of refills. She again had some discomfort in her chest and left arm at a funeral which is similar to presentation in the past about the only time that she has these symptoms. That went away and I believe was related to stress and her grieving.  PMHx:  Past Medical History  Diagnosis Date  . Hypertension   . Hyperlipidemia   . Diabetes mellitus without complication   . GERD (gastroesophageal reflux disease)   . Anxiety   . Depression   . Anemia   . IBS (irritable bowel syndrome)   . Vitamin D deficiency   . Cancer 1995    Colon Cancer  . Arrhythmia     Past Surgical History  Procedure Laterality Date  . Bunionectomy Bilateral 02/1987  . Carpal tunnel release Bilateral 09-30-01;10-21-01  . Anterior cruciate ligament repair Right 04-09-04    right knee  . Left knee  07-26-02  . Left foot  05-23-05  . Bunionectomy Right 03-08-08    right foot  . Cataract extraction w/ intraocular lens  implant, bilateral Bilateral 12-07-09;02-15-10    FAMHx:  Family History  Problem Relation Age of Onset  . Heart disease Father  MI  . Stroke Mother   . Rheum arthritis Mother   . Heart attack Brother     half brother  . Heart attack Maternal Uncle     half maternal uncle    SOCHx:   reports that she quit smoking about 27 years ago. Her smoking use included Cigarettes. She smoked 0.00 packs per day. She has never used smokeless tobacco. She reports that she does not drink alcohol or use illicit drugs.  ALLERGIES:  Allergies  Allergen Reactions  . Codeine Itching    ROS: A comprehensive review of systems was negative except for: Constitutional: positive for  fatigue  HOME MEDS: Current Outpatient Prescriptions  Medication Sig Dispense Refill  . albuterol (PROVENTIL HFA;VENTOLIN HFA) 108 (90 BASE) MCG/ACT inhaler 2 puffs every 6 hours if needed- rescue inhaler 1 Inhaler prn  . aspirin 81 MG chewable tablet Chew 162 mg by mouth daily.     Marland Kitchen atorvastatin (LIPITOR) 40 MG tablet Take 40 mg by mouth daily.    . benzonatate (TESSALON) 200 MG capsule Take 1 capsule (200 mg total) by mouth 3 (three) times daily as needed for cough. 30 capsule 1  . budesonide (PULMICORT) 0.5 MG/2ML nebulizer solution Take 0.5 mg by nebulization 2 (two) times daily.    . carisoprodol (SOMA) 250 MG tablet TAKE 1 TABLET EVERY 6 HOURS AS NEEDED 180 tablet 0  . cetirizine (ZYRTEC) 10 MG tablet Take 10 mg by mouth daily.    . cyanocobalamin (,VITAMIN B-12,) 1000 MCG/ML injection Inject 1,000 mcg into the muscle every 30 (thirty) days.    Marland Kitchen estrogen, conjugated,-medroxyprogesterone (PREMPRO) 0.3-1.5 MG per tablet Take 1 tablet by mouth daily.    Marland Kitchen FLUoxetine (PROZAC) 20 MG capsule Take 20 mg by mouth 2 (two) times daily.    . fluticasone (FLOVENT HFA) 110 MCG/ACT inhaler Inhale 2 puffs into the lungs 2 (two) times daily. RINSE MOUTH AFTER USE 1 Inhaler 11  . hydrochlorothiazide (HYDRODIURIL) 25 MG tablet Take 25 mg by mouth daily.    . hydrocortisone (ANUSOL-HC) 25 MG suppository Place 1 suppository (25 mg total) rectally 2 (two) times daily. (Patient taking differently: Place 25 mg rectally as needed. ) 12 suppository 0  . ipratropium-albuterol (DUONEB) 0.5-2.5 (3) MG/3ML SOLN Take 3 mLs by nebulization every 6 (six) hours as needed.    Marland Kitchen losartan (COZAAR) 100 MG tablet Take 100 mg by mouth daily.    . Magnesium 500 MG TABS Take 500 mg by mouth daily.     . metFORMIN (GLUCOPHAGE) 500 MG tablet Take 1,000 mg by mouth daily with breakfast.     . metoprolol (TOPROL-XL) 200 MG 24 hr tablet Take 200 mg by mouth daily.    . Misc Natural Products (OSTEO BI-FLEX JOINT SHIELD PO) Take by  mouth.     No current facility-administered medications for this visit.    LABS/IMAGING: No results found for this or any previous visit (from the past 48 hour(s)). No results found.  VITALS: BP 170/90 mmHg  Pulse 67  Ht 4' 11.5" (1.511 m)  Wt 158 lb 8 oz (71.895 kg)  BMI 31.49 kg/m2  EXAM: GEN: Awake, NAD HEENT: PERRLA, EOMI Lungs: Clear bilaterally Heart: Regular rate and rhythm, normal S1, S2, 2/6 diastolic murmur at LSB, 2/6 systolic murmur at apex Abdomen: Soft, nontender Extremities: No edema Neurologic: Grossly nonfocal Psychiatric: Normal mood and affect  EKG: Normal sinus rhythm at 67  ASSESSMENT: 1. Chest pain - negative NST for ischemia (08/25/2013) 2. Palpitations -  improved 3. Dyspnea on exertion and wheezing - mild AI and MR (09/01/2013) 4. Scintillating scotoma secondary to cough 5. Hypertension - uncontrolled 6. Dyslipidemia 7. Diabetes type 2 8. Medication non-compliance  PLAN: 1.   Mrs. Longanecker again had an episode of chest discomfort and arm pain associated with the funeral. She has never had symptoms outside of working at these events. Her blood pressure is poorly controlled today and has been noncompliant with her medications. She reports cost or having availability of medications are not factors. Her scintillations have improved -a bubble study was negative for PFO. She needs to continue to work on better blood pressure control. I'll plan to see her back in 6 months.  Pixie Casino, MD, [place] Attending Cardiologist [facility]  HILTY,Kenneth C 08/24/2014, 9:41 AM

## 2014-08-24 NOTE — Patient Instructions (Signed)
Your physician wants you to follow-up in: 6 Months You will receive a reminder letter in the mail two months in advance. If you don't receive a letter, please call our office to schedule the follow-up appointment.  

## 2014-09-05 ENCOUNTER — Encounter: Payer: Self-pay | Admitting: Physician Assistant

## 2014-09-13 DIAGNOSIS — Z961 Presence of intraocular lens: Secondary | ICD-10-CM | POA: Diagnosis not present

## 2014-09-13 DIAGNOSIS — E119 Type 2 diabetes mellitus without complications: Secondary | ICD-10-CM | POA: Diagnosis not present

## 2014-09-21 ENCOUNTER — Encounter: Payer: Self-pay | Admitting: Physician Assistant

## 2014-09-21 ENCOUNTER — Other Ambulatory Visit: Payer: Self-pay

## 2014-09-21 ENCOUNTER — Ambulatory Visit (INDEPENDENT_AMBULATORY_CARE_PROVIDER_SITE_OTHER): Payer: Medicare Other | Admitting: Physician Assistant

## 2014-09-21 VITALS — BP 110/78 | HR 64 | Temp 97.7°F | Resp 16 | Ht 60.0 in | Wt 158.0 lb

## 2014-09-21 DIAGNOSIS — E785 Hyperlipidemia, unspecified: Secondary | ICD-10-CM

## 2014-09-21 DIAGNOSIS — F329 Major depressive disorder, single episode, unspecified: Secondary | ICD-10-CM

## 2014-09-21 DIAGNOSIS — Z23 Encounter for immunization: Secondary | ICD-10-CM

## 2014-09-21 DIAGNOSIS — Z9114 Patient's other noncompliance with medication regimen: Secondary | ICD-10-CM

## 2014-09-21 DIAGNOSIS — I10 Essential (primary) hypertension: Secondary | ICD-10-CM

## 2014-09-21 DIAGNOSIS — Z79899 Other long term (current) drug therapy: Secondary | ICD-10-CM

## 2014-09-21 DIAGNOSIS — R209 Unspecified disturbances of skin sensation: Secondary | ICD-10-CM | POA: Diagnosis not present

## 2014-09-21 DIAGNOSIS — F32A Depression, unspecified: Secondary | ICD-10-CM

## 2014-09-21 DIAGNOSIS — E1140 Type 2 diabetes mellitus with diabetic neuropathy, unspecified: Secondary | ICD-10-CM | POA: Diagnosis not present

## 2014-09-21 DIAGNOSIS — E538 Deficiency of other specified B group vitamins: Secondary | ICD-10-CM

## 2014-09-21 DIAGNOSIS — E559 Vitamin D deficiency, unspecified: Secondary | ICD-10-CM

## 2014-09-21 DIAGNOSIS — E1149 Type 2 diabetes mellitus with other diabetic neurological complication: Secondary | ICD-10-CM

## 2014-09-21 LAB — CBC WITH DIFFERENTIAL/PLATELET
Basophils Absolute: 0 10*3/uL (ref 0.0–0.1)
Basophils Relative: 0 % (ref 0–1)
Eosinophils Absolute: 0.1 10*3/uL (ref 0.0–0.7)
Eosinophils Relative: 2 % (ref 0–5)
HEMATOCRIT: 40.4 % (ref 36.0–46.0)
Hemoglobin: 13.5 g/dL (ref 12.0–15.0)
Lymphocytes Relative: 47 % — ABNORMAL HIGH (ref 12–46)
Lymphs Abs: 2.2 10*3/uL (ref 0.7–4.0)
MCH: 27.3 pg (ref 26.0–34.0)
MCHC: 33.4 g/dL (ref 30.0–36.0)
MCV: 81.8 fL (ref 78.0–100.0)
MONOS PCT: 7 % (ref 3–12)
MPV: 9.5 fL (ref 8.6–12.4)
Monocytes Absolute: 0.3 10*3/uL (ref 0.1–1.0)
Neutro Abs: 2.1 10*3/uL (ref 1.7–7.7)
Neutrophils Relative %: 44 % (ref 43–77)
Platelets: 304 10*3/uL (ref 150–400)
RBC: 4.94 MIL/uL (ref 3.87–5.11)
RDW: 13.4 % (ref 11.5–15.5)
WBC: 4.7 10*3/uL (ref 4.0–10.5)

## 2014-09-21 LAB — HEMOGLOBIN A1C
Hgb A1c MFr Bld: 7.3 % — ABNORMAL HIGH (ref <5.7)
Mean Plasma Glucose: 163 mg/dL — ABNORMAL HIGH (ref <117)

## 2014-09-21 MED ORDER — "SYRINGE/NEEDLE (DISP) 25G X 5/8"" 3 ML MISC": Status: DC

## 2014-09-21 MED ORDER — METOPROLOL SUCCINATE ER 200 MG PO TB24: 200.0000 mg | ORAL_TABLET | Freq: Every day | ORAL | Status: DC

## 2014-09-21 MED ORDER — HYDROCHLOROTHIAZIDE 25 MG PO TABS: 25.0000 mg | ORAL_TABLET | Freq: Every day | ORAL | Status: DC

## 2014-09-21 MED ORDER — LOSARTAN POTASSIUM 100 MG PO TABS: 100.0000 mg | ORAL_TABLET | Freq: Every day | ORAL | Status: DC

## 2014-09-21 MED ORDER — CARISOPRODOL 250 MG PO TABS: 250.0000 mg | ORAL_TABLET | Freq: Four times a day (QID) | ORAL | Status: DC | PRN

## 2014-09-21 MED ORDER — CYANOCOBALAMIN 1000 MCG/ML IJ SOLN: 1000.0000 ug | INTRAMUSCULAR | Status: DC

## 2014-09-21 MED ORDER — ALBUTEROL SULFATE HFA 108 (90 BASE) MCG/ACT IN AERS: INHALATION_SPRAY | RESPIRATORY_TRACT | Status: DC

## 2014-09-21 MED ORDER — CONJ ESTROG-MEDROXYPROGEST ACE 0.3-1.5 MG PO TABS: 1.0000 | ORAL_TABLET | Freq: Every day | ORAL | Status: DC

## 2014-09-21 MED ORDER — METFORMIN HCL 500 MG PO TABS: 1000.0000 mg | ORAL_TABLET | Freq: Every day | ORAL | Status: DC

## 2014-09-21 MED ORDER — HYDROCORTISONE ACETATE 25 MG RE SUPP: 25.0000 mg | Freq: Two times a day (BID) | RECTAL | Status: DC

## 2014-09-21 MED ORDER — FLUOXETINE HCL 20 MG PO CAPS: 20.0000 mg | ORAL_CAPSULE | Freq: Every day | ORAL | Status: DC

## 2014-09-21 MED ORDER — ATORVASTATIN CALCIUM 40 MG PO TABS: 40.0000 mg | ORAL_TABLET | Freq: Every day | ORAL | Status: DC

## 2014-09-21 NOTE — Progress Notes (Signed)
Complete Physical  Assessment and Plan: 1. Essential hypertension - continue medications, DASH diet, exercise and monitor at home. Call if greater than 130/80.  - CBC with Differential - BASIC METABOLIC PANEL WITH GFR - Hepatic function panel - TSH - Urinalysis, Routine w reflex microscopic - Microalbumin / creatinine urine ratio  2. Diabetes mellitus type 2 with neurological manifestations Discussed general issues about diabetes pathophysiology and management., Educational material distributed., Suggested low cholesterol diet., Encouraged aerobic exercise., Discussed foot care., Reminded to get yearly retinal exam. - Hemoglobin A1c - Insulin, fasting - HM DIABETES FOOT EXAM  3. PARESTHESIA Check labs  4. Hyperlipidemia -continue medications, check lipids, decrease fatty foods, increase activity.  - Lipid panel  5. Vitamin D deficiency - Vit D  25 hydroxy (rtn osteoporosis monitoring)  6. Noncompliance with medication regimen Discussed the important of compliance to decrease the risk of MI, stroke, and death  12/01/22. Depression Depression- continue medications, stress management techniques discussed, increase water, good sleep hygiene discussed, increase exercise, and increase veggies.   8. B12 deficiency - Vitamin B12  9. Medication management - Magnesium  10. Need for prophylactic vaccination against Streptococcus pneumoniae (pneumococcus) - Pneumococcal conjugate vaccine 13-valent IM   Discussed med's effects and SE's. Screening labs and tests as requested with regular follow-up as recommended.  HPI  68 y.o. female  presents for a complete physical.   Her blood pressure has been controlled at home, today their BP is BP: 110/78 mmHg She does workout. She denies chest pain, shortness of breath, dizziness.  She follows with Dr. Wynonia Lawman for history of chest pain and shortness of breath, she is on bASA and has taken all of her medications today and her BP is good.  She is  on cholesterol medication, lipitor 40mg  and denies myalgias. Her cholesterol is not at goal. The cholesterol last visit was:   Lab Results  Component Value Date   CHOL 184 06/15/2014   HDL 45 06/15/2014   LDLCALC 97 06/15/2014   TRIG 208* 06/15/2014   CHOLHDL 4.1 06/15/2014    She has been working on diet and exercise for diabetes, she is on bASA, she is on ACE/ARB she is on metformin, and denies polydipsia, polyuria and visual disturbances. Last A1C in the office was:  Lab Results  Component Value Date   HGBA1C 7.1* 06/15/2014   Patient is on Vitamin D supplement.   She has been on prempro since 1999 and with her DM, suggest stopping this due to clotting factor and she agrees, will give her a taper.  She is on prozac and would like to go down to one a day.   Current Medications:  Current Outpatient Prescriptions on File Prior to Visit  Medication Sig Dispense Refill  . albuterol (PROVENTIL HFA;VENTOLIN HFA) 108 (90 BASE) MCG/ACT inhaler 2 puffs every 6 hours if needed- rescue inhaler 1 Inhaler prn  . aspirin 81 MG chewable tablet Chew 162 mg by mouth daily.     Marland Kitchen atorvastatin (LIPITOR) 40 MG tablet Take 1 tablet (40 mg total) by mouth daily. 30 tablet 9  . carisoprodol (SOMA) 250 MG tablet TAKE 1 TABLET EVERY 6 HOURS AS NEEDED 180 tablet 0  . cetirizine (ZYRTEC) 10 MG tablet Take 10 mg by mouth daily.    . cyanocobalamin (,VITAMIN B-12,) 1000 MCG/ML injection Inject 1,000 mcg into the muscle every 30 (thirty) days.    Marland Kitchen estrogen, conjugated,-medroxyprogesterone (PREMPRO) 0.3-1.5 MG per tablet Take 1 tablet by mouth daily.    Marland Kitchen  FLUoxetine (PROZAC) 20 MG capsule Take 1 capsule (20 mg total) by mouth 2 (two) times daily. 60 capsule 0  . hydrochlorothiazide (HYDRODIURIL) 25 MG tablet Take 25 mg by mouth daily.    . hydrocortisone (ANUSOL-HC) 25 MG suppository Place 1 suppository (25 mg total) rectally 2 (two) times daily. (Patient taking differently: Place 25 mg rectally as needed. ) 12  suppository 0  . losartan (COZAAR) 100 MG tablet Take 1 tablet (100 mg total) by mouth daily. 30 tablet 9  . Magnesium 500 MG TABS Take 500 mg by mouth daily.     . metFORMIN (GLUCOPHAGE) 500 MG tablet Take 1,000 mg by mouth daily with breakfast.     . metoprolol (TOPROL-XL) 200 MG 24 hr tablet Take 1 tablet (200 mg total) by mouth daily. 30 tablet 9  . Misc Natural Products (OSTEO BI-FLEX JOINT SHIELD PO) Take by mouth.     No current facility-administered medications on file prior to visit.   Health Maintenance:   Immunization History  Administered Date(s) Administered  . Influenza Split 06/23/2013  . Influenza, High Dose Seasonal PF 08/01/2014  . Pneumococcal-Unspecified 09/03/2012  . Td 10/03/2010   Tetanus: 2012  Pneumovax:2013  prevnar DUE Flu vaccine: 2014  Zostavax: Needs to check with insurance Pap: 2010  MGM: 05/2013 neg  DEXA: N/A  Colonoscopy: 2013 Due 5 years Dr. Oletta Lamas  EGD: N/A Eye: Dr. Claudean Kinds Dec 2015  Patient Care Team: Unk Pinto, MD as PCP - General (Internal Medicine) Winfield Cunas., MD as Consulting Physician (Gastroenterology) Pixie Casino, MD as Consulting Physician (Cardiology) Deneise Lever, MD as Consulting Physician (Pulmonary Disease)  Allergies:  Allergies  Allergen Reactions  . Codeine Itching   Medical History:  Past Medical History  Diagnosis Date  . Hypertension   . Hyperlipidemia   . Diabetes mellitus without complication   . GERD (gastroesophageal reflux disease)   . Anxiety   . Depression   . Anemia   . IBS (irritable bowel syndrome)   . Vitamin D deficiency   . Cancer 1995    Colon Cancer  . Arrhythmia    Surgical History:  Past Surgical History  Procedure Laterality Date  . Bunionectomy Bilateral 02/1987  . Carpal tunnel release Bilateral 09-30-01;10-21-01  . Anterior cruciate ligament repair Right 04-09-04    right knee  . Left knee  07-26-02  . Left foot  05-23-05  . Bunionectomy Right 03-08-08     right foot  . Cataract extraction w/ intraocular lens  implant, bilateral Bilateral 12-07-09;02-15-10   Family History:  Family History  Problem Relation Age of Onset  . Heart disease Father     MI  . Stroke Mother   . Rheum arthritis Mother   . Heart attack Brother     half brother  . Heart attack Maternal Uncle     half maternal uncle   Social History:  History  Substance Use Topics  . Smoking status: Former Smoker    Types: Cigarettes    Quit date: 09/23/1986  . Smokeless tobacco: Never Used  . Alcohol Use: No   Review of Systems: Review of Systems  Constitutional: Positive for malaise/fatigue. Negative for fever, weight loss and diaphoresis.  HENT: Negative.   Eyes: Negative.   Respiratory: Negative.   Cardiovascular: Negative.   Gastrointestinal: Negative.   Genitourinary: Negative.   Musculoskeletal: Negative.   Skin: Negative.   Neurological: Positive for tingling and sensory change. Negative for dizziness, tremors, speech change, focal weakness,  seizures, loss of consciousness and weakness.  Psychiatric/Behavioral: Negative.     Physical Exam: Estimated body mass index is 30.86 kg/(m^2) as calculated from the following:   Height as of this encounter: 5' (1.524 m).   Weight as of this encounter: 158 lb (71.668 kg). BP 110/78 mmHg  Pulse 64  Temp(Src) 97.7 F (36.5 C)  Resp 16  Ht 5' (1.524 m)  Wt 158 lb (71.668 kg)  BMI 30.86 kg/m2 General Appearance: Well nourished, in no apparent distress.  Eyes: PERRLA, EOMs, conjunctiva no swelling or erythema.   Sinuses: No Frontal/maxillary tenderness  ENT/Mouth: Ext aud canals clear, normal light reflex with TMs without erythema, bulging. Good dentition. No erythema, swelling, or exudate on post pharynx. Tonsils not swollen or erythematous. Hearing normal.  Neck: Supple, thyroid normal. No bruits  Respiratory: Respiratory effort normal, BS equal bilaterally without rales, rhonchi, wheezing or stridor.  Cardio:  RRR without murmurs, rubs or gallops. Brisk peripheral pulses without edema.  Chest: symmetric, with normal excursions and percussion.  Breasts: Symmetric, without lumps, nipple discharge, retractions.  Abdomen: Soft, nontender, no guarding, rebound, hernias, masses, or organomegaly. .  Lymphatics: Non tender without lymphadenopathy.  Genitourinary: defer Musculoskeletal: Full ROM all peripheral extremities,5/5 strength, and normal gait.  Skin: Warm, dry without rashes, lesions, ecchymosis. Neuro: Cranial nerves intact, reflexes equal bilaterally. Normal muscle tone, no cerebellar symptoms. Sensation decreased bilateral feet.  Psych: Awake and oriented X 3, normal affect, Insight and Judgment appropriate.   EKG: defer cardio   Vicie Mutters 10:39 AM [facility]

## 2014-09-21 NOTE — Patient Instructions (Addendum)
Can do a taper for the prempro, can do every other day for 1 week, then do 3 days a week, and then do once a week and then stop, can do over 1-3 months.   The cozaar you NEED to stay on.  The HCTZ/hydrocholothiazide is a fluid pill, it helps your BP, you can try to stop this but if you get swelling in her legs, weight gain, get back on it.  The metoprolol helps with blood pressure and decreases your heart rate too If you are fatigued you can try to cut this one in half.   Please take all of your medications.   Can cut the prozac to once a day.   Stevia is okay to sweeten things.   Recommendations For Diabetic/Prediabetic Patients:   -  Take medications as prescribed  -  Recommend Dr Fara Olden Fuhrman's book "The End of Diabetes "  And "The End of Dieting"- Can get at  www.[employer].com and encourage also get the Audio CD book  - AVOID Animal products, ie. Meat - red/white, Poultry and Dairy/especially cheese - Exercise at least 5 times a week for 30 minutes or preferably daily.  - No Smoking - Drink less than 2 drinks a day.  - Monitor your feet for sores - Have yearly Eye Exams - Recommend annual Flu vaccine  - Recommend Pneumovax and Prevnar vaccines - Shingles Vaccine (Zostavax) if over 57 y.o.  Goals:   - BMI less than 24 - Fasting sugar less than 130 or less than 150 if tapering medicines to lose weight  - Systolic BP less than 893  - Diastolic BP less than 80 - Bad LDL Cholesterol less than 70 - Triglycerides less than 150     Bad carbs also include fruit juice, alcohol, and sweet tea. These are empty calories that do not signal to your brain that you are full.   Please remember the good carbs are still carbs which convert into sugar. So please measure them out no more than 1/2-1 cup of rice, oatmeal, pasta, and beans  Veggies are however free foods! Pile them on.   Not all fruit is created equal. Please see the list below, the fruit at the bottom is higher in sugars than  the fruit at the top. Please avoid all dried fruits.

## 2014-09-22 LAB — HEPATIC FUNCTION PANEL
ALBUMIN: 4 g/dL (ref 3.5–5.2)
ALK PHOS: 93 U/L (ref 39–117)
ALT: 12 U/L (ref 0–35)
AST: 18 U/L (ref 0–37)
Bilirubin, Direct: 0.1 mg/dL (ref 0.0–0.3)
Indirect Bilirubin: 0.4 mg/dL (ref 0.2–1.2)
TOTAL PROTEIN: 7.5 g/dL (ref 6.0–8.3)
Total Bilirubin: 0.5 mg/dL (ref 0.2–1.2)

## 2014-09-22 LAB — URINALYSIS, ROUTINE W REFLEX MICROSCOPIC
Bilirubin Urine: NEGATIVE
Glucose, UA: NEGATIVE mg/dL
Hgb urine dipstick: NEGATIVE
Ketones, ur: NEGATIVE mg/dL
LEUKOCYTES UA: NEGATIVE
NITRITE: NEGATIVE
PH: 7.5 (ref 5.0–8.0)
Protein, ur: NEGATIVE mg/dL
Specific Gravity, Urine: <1.005 — ABNORMAL LOW (ref 1.005–1.030)
Urobilinogen, UA: 0.2 mg/dL (ref 0.0–1.0)

## 2014-09-22 LAB — BASIC METABOLIC PANEL WITH GFR
BUN: 6 mg/dL (ref 6–23)
CALCIUM: 9.4 mg/dL (ref 8.4–10.5)
CHLORIDE: 99 meq/L (ref 96–112)
CO2: 29 mEq/L (ref 19–32)
Creat: 0.68 mg/dL (ref 0.50–1.10)
GLUCOSE: 98 mg/dL (ref 70–99)
POTASSIUM: 4.6 meq/L (ref 3.5–5.3)
Sodium: 137 mEq/L (ref 135–145)

## 2014-09-22 LAB — LIPID PANEL
Cholesterol: 160 mg/dL (ref 0–200)
HDL: 42 mg/dL (ref >39)
LDL Cholesterol: 87 mg/dL (ref 0–99)
Total CHOL/HDL Ratio: 3.8 Ratio
Triglycerides: 156 mg/dL — ABNORMAL HIGH (ref <150)
VLDL: 31 mg/dL (ref 0–40)

## 2014-09-22 LAB — VITAMIN D 25 HYDROXY (VIT D DEFICIENCY, FRACTURES): VIT D 25 HYDROXY: 40 ng/mL (ref 30–100)

## 2014-09-22 LAB — TSH: TSH: 1.229 u[IU]/mL (ref 0.350–4.500)

## 2014-09-22 LAB — VITAMIN B12: Vitamin B-12: 794 pg/mL (ref 211–911)

## 2014-09-22 LAB — MAGNESIUM: Magnesium: 2 mg/dL (ref 1.5–2.5)

## 2014-09-22 LAB — INSULIN, FASTING: Insulin fasting, serum: 6.6 u[IU]/mL (ref 2.0–19.6)

## 2014-09-22 LAB — MICROALBUMIN / CREATININE URINE RATIO
Creatinine, Urine: 48.7 mg/dL
Microalb Creat Ratio: 37 mg/g — ABNORMAL HIGH (ref 0.0–30.0)
Microalb, Ur: 1.8 mg/dL (ref <2.0)

## 2014-09-23 ENCOUNTER — Encounter: Payer: Self-pay | Admitting: *Deleted

## 2014-12-07 DIAGNOSIS — M25512 Pain in left shoulder: Secondary | ICD-10-CM | POA: Diagnosis not present

## 2014-12-07 DIAGNOSIS — M25561 Pain in right knee: Secondary | ICD-10-CM | POA: Diagnosis not present

## 2014-12-07 DIAGNOSIS — M79671 Pain in right foot: Secondary | ICD-10-CM | POA: Diagnosis not present

## 2014-12-22 ENCOUNTER — Encounter: Payer: Self-pay | Admitting: Physician Assistant

## 2014-12-22 ENCOUNTER — Ambulatory Visit (INDEPENDENT_AMBULATORY_CARE_PROVIDER_SITE_OTHER): Payer: Medicare Other | Admitting: Physician Assistant

## 2014-12-22 VITALS — BP 120/78 | HR 72 | Temp 97.7°F | Resp 16 | Ht 60.0 in | Wt 154.0 lb

## 2014-12-22 DIAGNOSIS — E1149 Type 2 diabetes mellitus with other diabetic neurological complication: Secondary | ICD-10-CM

## 2014-12-22 DIAGNOSIS — I10 Essential (primary) hypertension: Secondary | ICD-10-CM | POA: Diagnosis not present

## 2014-12-22 DIAGNOSIS — K589 Irritable bowel syndrome without diarrhea: Secondary | ICD-10-CM

## 2014-12-22 DIAGNOSIS — Z0001 Encounter for general adult medical examination with abnormal findings: Secondary | ICD-10-CM | POA: Diagnosis not present

## 2014-12-22 DIAGNOSIS — Z9114 Patient's other noncompliance with medication regimen: Secondary | ICD-10-CM | POA: Diagnosis not present

## 2014-12-22 DIAGNOSIS — K2100 Gastro-esophageal reflux disease with esophagitis, without bleeding: Secondary | ICD-10-CM

## 2014-12-22 DIAGNOSIS — R209 Unspecified disturbances of skin sensation: Secondary | ICD-10-CM

## 2014-12-22 DIAGNOSIS — R6889 Other general symptoms and signs: Secondary | ICD-10-CM | POA: Diagnosis not present

## 2014-12-22 DIAGNOSIS — E785 Hyperlipidemia, unspecified: Secondary | ICD-10-CM | POA: Diagnosis not present

## 2014-12-22 DIAGNOSIS — F419 Anxiety disorder, unspecified: Secondary | ICD-10-CM

## 2014-12-22 DIAGNOSIS — F32A Depression, unspecified: Secondary | ICD-10-CM

## 2014-12-22 DIAGNOSIS — D649 Anemia, unspecified: Secondary | ICD-10-CM

## 2014-12-22 DIAGNOSIS — E559 Vitamin D deficiency, unspecified: Secondary | ICD-10-CM | POA: Diagnosis not present

## 2014-12-22 DIAGNOSIS — E1140 Type 2 diabetes mellitus with diabetic neuropathy, unspecified: Secondary | ICD-10-CM | POA: Diagnosis not present

## 2014-12-22 DIAGNOSIS — Z85038 Personal history of other malignant neoplasm of large intestine: Secondary | ICD-10-CM | POA: Insufficient documentation

## 2014-12-22 DIAGNOSIS — F329 Major depressive disorder, single episode, unspecified: Secondary | ICD-10-CM

## 2014-12-22 DIAGNOSIS — T7840XS Allergy, unspecified, sequela: Secondary | ICD-10-CM

## 2014-12-22 DIAGNOSIS — Z9181 History of falling: Secondary | ICD-10-CM

## 2014-12-22 DIAGNOSIS — J45909 Unspecified asthma, uncomplicated: Secondary | ICD-10-CM

## 2014-12-22 DIAGNOSIS — E538 Deficiency of other specified B group vitamins: Secondary | ICD-10-CM

## 2014-12-22 LAB — CBC WITH DIFFERENTIAL/PLATELET
BASOS ABS: 0 10*3/uL (ref 0.0–0.1)
Basophils Relative: 0 % (ref 0–1)
Eosinophils Absolute: 0.1 10*3/uL (ref 0.0–0.7)
Eosinophils Relative: 2 % (ref 0–5)
HEMATOCRIT: 40.6 % (ref 36.0–46.0)
Hemoglobin: 13.4 g/dL (ref 12.0–15.0)
Lymphocytes Relative: 45 % (ref 12–46)
Lymphs Abs: 2.5 10*3/uL (ref 0.7–4.0)
MCH: 27.1 pg (ref 26.0–34.0)
MCHC: 33 g/dL (ref 30.0–36.0)
MCV: 82.2 fL (ref 78.0–100.0)
MONO ABS: 0.5 10*3/uL (ref 0.1–1.0)
MONOS PCT: 9 % (ref 3–12)
MPV: 9.7 fL (ref 8.6–12.4)
NEUTROS PCT: 44 % (ref 43–77)
Neutro Abs: 2.4 10*3/uL (ref 1.7–7.7)
PLATELETS: 309 10*3/uL (ref 150–400)
RBC: 4.94 MIL/uL (ref 3.87–5.11)
RDW: 14.5 % (ref 11.5–15.5)
WBC: 5.5 10*3/uL (ref 4.0–10.5)

## 2014-12-22 LAB — LIPID PANEL
Cholesterol: 187 mg/dL (ref 0–200)
HDL: 40 mg/dL — AB (ref >=46)
LDL Cholesterol: 103 mg/dL — ABNORMAL HIGH (ref 0–99)
TRIGLYCERIDES: 222 mg/dL — AB (ref <150)
Total CHOL/HDL Ratio: 4.7 Ratio
VLDL: 44 mg/dL — AB (ref 0–40)

## 2014-12-22 LAB — BASIC METABOLIC PANEL WITH GFR
BUN: 8 mg/dL (ref 6–23)
CHLORIDE: 100 meq/L (ref 96–112)
CO2: 31 meq/L (ref 19–32)
Calcium: 9.8 mg/dL (ref 8.4–10.5)
Creat: 0.73 mg/dL (ref 0.50–1.10)
GFR, Est African American: >89 mL/min
GFR, Est Non African American: 85 mL/min
GLUCOSE: 106 mg/dL — AB (ref 70–99)
POTASSIUM: 4.7 meq/L (ref 3.5–5.3)
SODIUM: 138 meq/L (ref 135–145)

## 2014-12-22 LAB — HEPATIC FUNCTION PANEL
ALBUMIN: 4.1 g/dL (ref 3.5–5.2)
ALT: 17 U/L (ref 0–35)
AST: 20 U/L (ref 0–37)
Alkaline Phosphatase: 93 U/L (ref 39–117)
Bilirubin, Direct: 0.1 mg/dL (ref 0.0–0.3)
Indirect Bilirubin: 0.6 mg/dL (ref 0.2–1.2)
Total Bilirubin: 0.7 mg/dL (ref 0.2–1.2)
Total Protein: 7.3 g/dL (ref 6.0–8.3)

## 2014-12-22 LAB — HEMOGLOBIN A1C
HEMOGLOBIN A1C: 7.6 % — AB (ref <5.7)
Mean Plasma Glucose: 171 mg/dL — ABNORMAL HIGH (ref <117)

## 2014-12-22 LAB — MAGNESIUM: MAGNESIUM: 2 mg/dL (ref 1.5–2.5)

## 2014-12-22 LAB — TSH: TSH: 1.04 u[IU]/mL (ref 0.350–4.500)

## 2014-12-22 MED ORDER — FLUOXETINE HCL 20 MG PO CAPS: 20.0000 mg | ORAL_CAPSULE | Freq: Every day | ORAL | Status: DC

## 2014-12-22 MED ORDER — HYDROCORTISONE ACETATE 25 MG RE SUPP: 25.0000 mg | Freq: Two times a day (BID) | RECTAL | Status: DC

## 2014-12-22 NOTE — Patient Instructions (Signed)
Get back on prozac, call if you need anything  ACE effects > top of the list of usual suspects for a reason for cough/shortness of breath, would stop losartan and switch to Diovan.   ? Acid (or non-acid) GERD > always difficult to exclude as up to 75% of pts in some series report no assoc GI/ Heartburn symptoms> rec max (24h) acid suppression and diet restrictions/ reviewed and instructions given in writing.   The Beta blocker that you are on can worsen your breathing and cough, we may need to switch to bispropolol or a more selective betablocker  Diabetes is a very complicated disease...lets simplify it.  An easy way to look at it to understand the complications is if you think of the extra sugar floating in your blood stream as glass shards floating through your blood stream.    Diabetes affects your small vessels first: 1) The glass shards (sugar) scraps down the tiny blood vessels in your eyes and lead to diabetic retinopathy, the leading cause of blindness in the [place]. Diabetes is the leading cause of newly diagnosed adult (18 to 69 years of age) blindness in the [place].  2) The glass shards scratches down the tiny vessels of your legs leading to nerve damage called neuropathy and can lead to amputations of your feet. More than 60% of all non-traumatic amputations of lower limbs occur in people with diabetes.  3) Over time the small vessels in your brain are shredded and closed off, individually this does not cause any problems but over a long period of time many of the small vessels being blocked can lead to Vascular Dementia.   4) Your kidney's are a filter system and have a "net" that keeps certain things in the body and lets bad things out. Sugar shreds this net and leads to kidney damage and eventually failure. Decreasing the sugar that is destroying the net and certain blood pressure medications can help stop or decrease progression of kidney disease. Diabetes was the primary  cause of kidney failure in 44 percent of all new cases in 2011.  5) Diabetes also destroys the small vessels in your penis that lead to erectile dysfunction. Eventually the vessels are so damaged that you may not be responsive to cialis or viagra.   Diabetes and your large vessels: Your larger vessels consist of your coronary arteries in your heart and the carotid vessels to your brain. Diabetes or even increased sugars put you at 300% increased risk of heart attack and stroke and this is why.. The sugar scrapes down your large blood vessels and your body sees this as an internal injury and tries to repair itself. Just like you get a scab on your skin, your platelets will stick to the blood vessel wall trying to heal it. This is why we have diabetics on low dose aspirin daily, this prevents the platelets from sticking and can prevent plaque formation. In addition, your body takes cholesterol and tries to shove it into the open wound. This is why we want your LDL, or bad cholesterol, below 70.   The combination of platelets and cholesterol over 5-10 years forms plaque that can break off and cause a heart attack or stroke.   PLEASE REMEMBER:  Diabetes is preventable! Up to 23 percent of complications and morbidities among individuals with type 2 diabetes can be prevented, delayed, or effectively treated and minimized with regular visits to a health professional, appropriate monitoring and medication, and a healthy diet  and lifestyle.  Recommendations For Diabetic/Prediabetic Patients:   -  Take medications as prescribed  -  Recommend Dr Fara Olden Fuhrman's book "The End of Diabetes "  And "The End of Dieting"- Can get at  www.[employer]com and encourage also get the Audio CD book  - AVOID Animal products, ie. Meat - red/white, Poultry and Dairy/especially cheese - Exercise at least 5 times a week for 30 minutes or preferably daily.  - No Smoking - Drink less than 2 drinks a day.  - Monitor your feet for  sores - Have yearly Eye Exams - Recommend annual Flu vaccine  - Recommend Pneumovax and Prevnar vaccines - Shingles Vaccine (Zostavax) if over 5 y.o.  Goals:   - BMI less than 24 - Fasting sugar less than 130 or less than 150 if tapering medicines to lose weight  - Systolic BP less than 030  - Diastolic BP less than 80 - Bad LDL Cholesterol less than 70 - Triglycerides less than 150   Preventive Care for Adults A healthy lifestyle and preventive care can promote health and wellness. Preventive health guidelines for women include the following key practices.  A routine yearly physical is a good way to check with your health care provider about your health and preventive screening. It is a chance to share any concerns and updates on your health and to receive a thorough exam.  Visit your dentist for a routine exam and preventive care every 6 months. Brush your teeth twice a day and floss once a day. Good oral hygiene prevents tooth decay and gum disease.  The frequency of eye exams is based on your age, health, family medical history, use of contact lenses, and other factors. Follow your health care provider's recommendations for frequency of eye exams.  Eat a healthy diet. Foods like vegetables, fruits, whole grains, low-fat dairy products, and lean protein foods contain the nutrients you need without too many calories. Decrease your intake of foods high in solid fats, added sugars, and salt. Eat the right amount of calories for you.Get information about a proper diet from your health care provider, if necessary.  Regular physical exercise is one of the most important things you can do for your health. Most adults should get at least 150 minutes of moderate-intensity exercise (any activity that increases your heart rate and causes you to sweat) each week. In addition, most adults need muscle-strengthening exercises on 2 or more days a week.  Maintain a healthy weight. The body mass  index (BMI) is a screening tool to identify possible weight problems. It provides an estimate of body fat based on height and weight. Your health care provider can find your BMI and can help you achieve or maintain a healthy weight.For adults 20 years and older:  A BMI below 18.5 is considered underweight.  A BMI of 18.5 to 24.9 is normal.  A BMI of 25 to 29.9 is considered overweight.  A BMI of 30 and above is considered obese.  Maintain normal blood lipids and cholesterol levels by exercising and minimizing your intake of saturated fat. Eat a balanced diet with plenty of fruit and vegetables. If your lipid or cholesterol levels are high, you are over 50, or you are at high risk for heart disease, you may need your cholesterol levels checked more frequently.Ongoing high lipid and cholesterol levels should be treated with medicines if diet and exercise are not working.  If you smoke, find out from your health care provider how to  quit. If you do not use tobacco, do not start.  Lung cancer screening is recommended for adults aged 26-80 years who are at high risk for developing lung cancer because of a history of smoking. A yearly low-dose CT scan of the lungs is recommended for people who have at least a 30-pack-year history of smoking and are a current smoker or have quit within the past 15 years. A pack year of smoking is smoking an average of 1 pack of cigarettes a day for 1 year (for example: 1 pack a day for 30 years or 2 packs a day for 15 years). Yearly screening should continue until the smoker has stopped smoking for at least 15 years. Yearly screening should be stopped for people who develop a health problem that would prevent them from having lung cancer treatment.  Avoid use of street drugs. Do not share needles with anyone. Ask for help if you need support or instructions about stopping the use of drugs.  High blood pressure causes heart disease and increases the risk of stroke.   Ongoing high blood pressure should be treated with medicines if weight loss and exercise do not work.  If you are 79-27 years old, ask your health care provider if you should take aspirin to prevent strokes.  Diabetes screening involves taking a blood sample to check your fasting blood sugar level. This should be done once every 3 years, after age 25, if you are within normal weight and without risk factors for diabetes. Testing should be considered at a younger age or be carried out more frequently if you are overweight and have at least 1 risk factor for diabetes.  Breast cancer screening is essential preventive care for women. You should practice "breast self-awareness." This means understanding the normal appearance and feel of your breasts and may include breast self-examination. Any changes detected, no matter how small, should be reported to a health care provider. Women in their 19s and 30s should have a clinical breast exam (CBE) by a health care provider as part of a regular health exam every 1 to 3 years. After age 24, women should have a CBE every year. Starting at age 32, women should consider having a mammogram (breast X-ray test) every year. Women who have a family history of breast cancer should talk to their health care provider about genetic screening. Women at a high risk of breast cancer should talk to their health care providers about having an MRI and a mammogram every year.  Breast cancer gene (BRCA)-related cancer risk assessment is recommended for women who have family members with BRCA-related cancers. BRCA-related cancers include breast, ovarian, tubal, and peritoneal cancers. Having family members with these cancers may be associated with an increased risk for harmful changes (mutations) in the breast cancer genes BRCA1 and BRCA2. Results of the assessment will determine the need for genetic counseling and BRCA1 and BRCA2 testing.  Routine pelvic exams to screen for cancer are  no longer recommended for nonpregnant women who are considered low risk for cancer of the pelvic organs (ovaries, uterus, and vagina) and who do not have symptoms. Ask your health care provider if a screening pelvic exam is right for you.  If you have had past treatment for cervical cancer or a condition that could lead to cancer, you need Pap tests and screening for cancer for at least 20 years after your treatment. If Pap tests have been discontinued, your risk factors (such as having a new sexual partner)  need to be reassessed to determine if screening should be resumed. Some women have medical problems that increase the chance of getting cervical cancer. In these cases, your health care provider may recommend more frequent screening and Pap tests.    Colorectal cancer can be detected and often prevented. Most routine colorectal cancer screening begins at the age of 40 years and continues through age 45 years. However, your health care provider may recommend screening at an earlier age if you have risk factors for colon cancer. On a yearly basis, your health care provider may provide home test kits to check for hidden blood in the stool. Use of a small camera at the end of a tube, to directly examine the colon (sigmoidoscopy or colonoscopy), can detect the earliest forms of colorectal cancer. Talk to your health care provider about this at age 12, when routine screening begins. Direct exam of the colon should be repeated every 5-10 years through age 68 years, unless early forms of pre-cancerous polyps or small growths are found.  Osteoporosis is a disease in which the bones lose minerals and strength with aging. This can result in serious bone fractures or breaks. The risk of osteoporosis can be identified using a bone density scan. Women ages 73 years and over and women at risk for fractures or osteoporosis should discuss screening with their health care providers. Ask your health care provider whether  you should take a calcium supplement or vitamin D to reduce the rate of osteoporosis.  Menopause can be associated with physical symptoms and risks. Hormone replacement therapy is available to decrease symptoms and risks. You should talk to your health care provider about whether hormone replacement therapy is right for you.  Use sunscreen. Apply sunscreen liberally and repeatedly throughout the day. You should seek shade when your shadow is shorter than you. Protect yourself by wearing long sleeves, pants, a wide-brimmed hat, and sunglasses year round, whenever you are outdoors.  Once a month, do a whole body skin exam, using a mirror to look at the skin on your back. Tell your health care provider of new moles, moles that have irregular borders, moles that are larger than a pencil eraser, or moles that have changed in shape or color.  Stay current with required vaccines (immunizations).  Influenza vaccine. All adults should be immunized every year.  Tetanus, diphtheria, and acellular pertussis (Td, Tdap) vaccine. Pregnant women should receive 1 dose of Tdap vaccine during each pregnancy. The dose should be obtained regardless of the length of time since the last dose. Immunization is preferred during the 27th-36th week of gestation. An adult who has not previously received Tdap or who does not know her vaccine status should receive 1 dose of Tdap. This initial dose should be followed by tetanus and diphtheria toxoids (Td) booster doses every 10 years. Adults with an unknown or incomplete history of completing a 3-dose immunization series with Td-containing vaccines should begin or complete a primary immunization series including a Tdap dose. Adults should receive a Td booster every 10 years.    Zoster vaccine. One dose is recommended for adults aged 32 years or older unless certain conditions are present.    Pneumococcal 13-valent conjugate (PCV13) vaccine. When indicated, a person who is  uncertain of her immunization history and has no record of immunization should receive the PCV13 vaccine. An adult aged 68 years or older who has certain medical conditions and has not been previously immunized should receive 1 dose of PCV13 vaccine.  This PCV13 should be followed with a dose of pneumococcal polysaccharide (PPSV23) vaccine. The PPSV23 vaccine dose should be obtained at least 8 weeks after the dose of PCV13 vaccine. An adult aged 85 years or older who has certain medical conditions and previously received 1 or more doses of PPSV23 vaccine should receive 1 dose of PCV13. The PCV13 vaccine dose should be obtained 1 or more years after the last PPSV23 vaccine dose.    Pneumococcal polysaccharide (PPSV23) vaccine. When PCV13 is also indicated, PCV13 should be obtained first. All adults aged 18 years and older should be immunized. An adult younger than age 55 years who has certain medical conditions should be immunized. Any person who resides in a nursing home or long-term care facility should be immunized. An adult smoker should be immunized. People with an immunocompromised condition and certain other conditions should receive both PCV13 and PPSV23 vaccines. People with human immunodeficiency virus (HIV) infection should be immunized as soon as possible after diagnosis. Immunization during chemotherapy or radiation therapy should be avoided. Routine use of PPSV23 vaccine is not recommended for American Indians, [place]atives, or people younger than 65 years unless there are medical conditions that require PPSV23 vaccine. When indicated, people who have unknown immunization and have no record of immunization should receive PPSV23 vaccine. One-time revaccination 5 years after the first dose of PPSV23 is recommended for people aged 19-64 years who have chronic kidney failure, nephrotic syndrome, asplenia, or immunocompromised conditions. People who received 1-2 doses of PPSV23 before age 58 years  should receive another dose of PPSV23 vaccine at age 109 years or later if at least 5 years have passed since the previous dose. Doses of PPSV23 are not needed for people immunized with PPSV23 at or after age 36 years.   Preventive Services / Frequency  Ages 75 years and over  Blood pressure check.  Lipid and cholesterol check.  Lung cancer screening. / Every year if you are aged 55-80 years and have a 30-pack-year history of smoking and currently smoke or have quit within the past 15 years. Yearly screening is stopped once you have quit smoking for at least 15 years or develop a health problem that would prevent you from having lung cancer treatment.  Clinical breast exam.** / Every year after age 57 years.  BRCA-related cancer risk assessment.** / For women who have family members with a BRCA-related cancer (breast, ovarian, tubal, or peritoneal cancers).  Mammogram.** / Every year beginning at age 34 years and continuing for as long as you are in good health. Consult with your health care provider.  Pap test.** / Every 3 years starting at age 68 years through age 58 or 56 years with 3 consecutive normal Pap tests. Testing can be stopped between 65 and 70 years with 3 consecutive normal Pap tests and no abnormal Pap or HPV tests in the past 10 years.  Fecal occult blood test (FOBT) of stool. / Every year beginning at age 76 years and continuing until age 9 years. You may not need to do this test if you get a colonoscopy every 10 years.  Flexible sigmoidoscopy or colonoscopy.** / Every 5 years for a flexible sigmoidoscopy or every 10 years for a colonoscopy beginning at age 39 years and continuing until age 74 years.  Hepatitis C blood test.** / For all people born from [birth year] through 1965 and any individual with known risks for hepatitis C.  Osteoporosis screening.** / A one-time screening for women ages 84 years  and over and women at risk for fractures or osteoporosis.  Skin self-exam.  / Monthly.  Influenza vaccine. / Every year.  Tetanus, diphtheria, and acellular pertussis (Tdap/Td) vaccine.** / 1 dose of Td every 10 years.  Zoster vaccine.** / 1 dose for adults aged 59 years or older.  Pneumococcal 13-valent conjugate (PCV13) vaccine.** / Consult your health care provider.  Pneumococcal polysaccharide (PPSV23) vaccine.** / 1 dose for all adults aged 29 years and older. Screening for abdominal aortic aneurysm (AAA)  by ultrasound is recommended for people who have history of high blood pressure or who are current or former smokers.

## 2014-12-22 NOTE — Progress Notes (Signed)
MEDICARE ANNUAL WELLNESS VISIT AND FOLLOW UP  Assessment:   1. Essential hypertension - continue medications, DASH diet, exercise and monitor at home. Call if greater than 130/80.  - CBC with Differential/Platelet - BASIC METABOLIC PANEL WITH GFR - Hepatic function panel - TSH  2. Diabetes mellitus type 2 with neurological manifestations Discussed general issues about diabetes pathophysiology and management., Educational material distributed., Suggested low cholesterol diet., Encouraged aerobic exercise., Discussed foot care., Reminded to get yearly retinal exam. - Hemoglobin A1c - HM DIABETES FOOT EXAM  3. Hyperlipidemia -continue medications, check lipids, decrease fatty foods, increase activity. - Lipid panel  4. Asthmatic bronchitis without complication Controlled, still with chronic cough, discussed stopping losartan and switching to diovan and switching BB to more selective.   5. Gastroesophageal reflux disease with esophagitis Continue PPI/H2 blocker, diet discussed  6. IBS (irritable bowel syndrome) .diet controlled  7. Anemia, unspecified anemia type Anemia - monitor, continue iron supp with Vitamin C and increase green leafy veggies  8. Depression - FLUoxetine (PROZAC) 20 MG capsule; Take 1 capsule (20 mg total) by mouth daily.  Dispense: 90 capsule; Refill: 0  9. PARESTHESIA monitor  10. Allergic state, sequela Continue OTC allergy pills  11. Anxiety Anxiety-  continue medications, stress management techniques discussed, increase water, good sleep hygiene discussed, increase exercise, and increase veggies.   12. Vitamin D deficiency - Vit D  25 hydroxy (rtn osteoporosis monitoring)  13. Noncompliance with medication regimen - Magnesium  14. At low risk for fall  15. Encounter for general adult medical examination with abnormal findings  16. B12 deficiency Continue supplement  17. History of colon cancer, no staging Follow up GI  Over 30 minutes  of exam, counseling, chart review, and critical decision making was performed  Plan:   During the course of the visit the patient was educated and counseled about appropriate screening and preventive services including:    Pneumococcal vaccine   Influenza vaccine  Td vaccine  Screening electrocardiogram  Screening mammography  Bone densitometry screening  Colorectal cancer screening  Diabetes screening  Glaucoma screening  Nutrition counseling   Advanced directives: given info/requested  Conditions/risks identified: Diabetes is not at goal, ACE/ARB therapy: Yes. Urinary Incontinence is not an issue: discussed non pharmacology and pharmacology options.  Fall risk: low- discussed PT, home fall assessment, medications.    Subjective:   Nancy Myers is a 69 y.o. female who presents for Medicare Annual Wellness Visit and 3 month follow up on hypertension, diabetes, hyperlipidemia, vitamin D def.  Date of last medicare wellness visit was 12/09/2013  Her blood pressure has been controlled at home, today their BP is BP: 120/78 mmHg She does workout. She denies chest pain, dizziness. She has chronic shortness of breath with exertion. She has been having throat clearing and chronic cough, She is on cholesterol medication and denies myalgias. Her cholesterol is not at goal less than 70. The cholesterol last visit was:   Lab Results  Component Value Date   CHOL 160 09/21/2014   HDL 42 09/21/2014   LDLCALC 87 09/21/2014   TRIG 156* 09/21/2014   CHOLHDL 3.8 09/21/2014  She has been working on diet and exercise for Diabetes with diabetic polyneuropathy, she is on bASA, she is on ACE/ARB, and denies paresthesia of the feet, polydipsia, polyuria and visual disturbances. Last A1C was:  Lab Results  Component Value Date   HGBA1C 7.3* 09/21/2014  Patient is on Vitamin D supplement. Lab Results  Component Value Date  VD25OH 40 09/21/2014   She has asthma and allergies,  follows with Dr. Annamaria Boots, she is on albuterol and last PFT was 09/2013.  She is on prozac for depression/anxiety which helps she was trying to get off of it but friend recently passed and she has been struggling with depression, decreased desire to go out during the day, do things she enjoys, and would like to get back on prozac, has been out of it.  She has been off the prepro for 2 weeks and would like to stop it, I agree with this due to age and increase risk of clots/CA associated with it.  She has b12 def and is on b12 shots.   Saw Dr. Marlou Sa for left shoulder and had injection that is helping, will follow up.   Medication Review Current Outpatient Prescriptions on File Prior to Visit  Medication Sig Dispense Refill  . albuterol (PROVENTIL HFA;VENTOLIN HFA) 108 (90 BASE) MCG/ACT inhaler 2 puffs every 6 hours if needed- rescue inhaler 3 Inhaler 3  . aspirin 81 MG chewable tablet Chew 162 mg by mouth daily.     Marland Kitchen atorvastatin (LIPITOR) 40 MG tablet Take 1 tablet (40 mg total) by mouth daily. 90 tablet 3  . carisoprodol (SOMA) 250 MG tablet Take 1 tablet (250 mg total) by mouth every 6 (six) hours as needed. 180 tablet 3  . cetirizine (ZYRTEC) 10 MG tablet Take 10 mg by mouth daily.    . cyanocobalamin (,VITAMIN B-12,) 1000 MCG/ML injection Inject 1 mL (1,000 mcg total) into the muscle every 30 (thirty) days. 10 mL 3  . estrogen, conjugated,-medroxyprogesterone (PREMPRO) 0.3-1.5 MG per tablet Take 1 tablet by mouth daily. 90 tablet 0  . FLUoxetine (PROZAC) 20 MG capsule Take 1 capsule (20 mg total) by mouth daily. 90 capsule 0  . hydrochlorothiazide (HYDRODIURIL) 25 MG tablet Take 1 tablet (25 mg total) by mouth daily. 90 tablet 3  . hydrocortisone (ANUSOL-HC) 25 MG suppository Place 1 suppository (25 mg total) rectally 2 (two) times daily. 24 suppository 0  . losartan (COZAAR) 100 MG tablet Take 1 tablet (100 mg total) by mouth daily. 90 tablet 3  . Magnesium 500 MG TABS Take 500 mg by mouth  daily.     . metFORMIN (GLUCOPHAGE) 500 MG tablet Take 2 tablets (1,000 mg total) by mouth daily with breakfast. 180 tablet 3  . metoprolol (TOPROL-XL) 200 MG 24 hr tablet Take 1 tablet (200 mg total) by mouth daily. 90 tablet 3  . Misc Natural Products (OSTEO BI-FLEX JOINT SHIELD PO) Take by mouth.    . SYRINGE-NEEDLE, DISP, 3 ML 25G X 5/8" 3 ML MISC Use as directed to inject B12 monthly 100 each 0   No current facility-administered medications on file prior to visit.    Current Problems (verified) Patient Active Problem List   Diagnosis Date Noted  . Noncompliance with medication regimen 08/24/2014  . Scintillating scotoma of both eyes 08/16/2013  . Hyperlipidemia   . Diabetes mellitus type 2 with neurological manifestations   . GERD (gastroesophageal reflux disease)   . Anxiety   . IBS (irritable bowel syndrome)   . Vitamin D deficiency   . Anemia 08/08/2009  . Depression 08/08/2009  . Essential hypertension 08/08/2009  . PARESTHESIA 08/08/2009  . Asthmatic bronchitis without complication 31/54/0086  . Allergic state 08/08/2009   Screening Tests Immunization History  Administered Date(s) Administered  . Influenza Split 06/23/2013  . Influenza, High Dose Seasonal PF 08/01/2014  . Pneumococcal Conjugate-13 09/21/2014  .  Pneumococcal-Unspecified 09/03/2012  . Td 10/03/2010   Preventative care: Tetanus: 2012  Pneumovax:2013  Prevnar 13: 2015 Flu vaccine: 2014  Zostavax: Needs to check with insurance Pap: 2010  Declines another MGM: 06/2014 neg  DEXA: N/A  Colonoscopy: 2013 Due 3-5 years Dr. Oletta Lamas-? Due this year  EGD: N/A FTs 09/2013 CT pelvis 01/2012 CXR 07/2013 Stress test 2014 Echo 08/2013  Names of Other Physician/Practitioners you currently use: 1. [facility]- here for primary care 2. Dr. Celene Squibb, eye doctor, last visit 10/2014 3. Dr. Deatra Ina, dentist, last visit 2 years Patient Care Team: Unk Pinto, MD as PCP  - General (Internal Medicine) Laurence Spates, MD as Consulting Physician (Gastroenterology) Pixie Casino, MD as Consulting Physician (Cardiology) Deneise Lever, MD as Consulting Physician (Pulmonary Disease) Dr. Marlou Sa, ortho for left shoulder  Past Surgical History  Procedure Laterality Date  . Bunionectomy Bilateral 02/1987  . Carpal tunnel release Bilateral 09-30-01;10-21-01  . Anterior cruciate ligament repair Right 04-09-04    right knee  . Left knee  07-26-02  . Left foot  05-23-05  . Bunionectomy Right 03-08-08    right foot  . Cataract extraction w/ intraocular lens  implant, bilateral Bilateral 12-07-09;02-15-10   Family History  Problem Relation Age of Onset  . Heart disease Father     MI  . Stroke Mother   . Rheum arthritis Mother   . Heart attack Brother     half brother  . Heart attack Maternal Uncle     half maternal uncle   History  Substance Use Topics  . Smoking status: Former Smoker    Types: Cigarettes    Quit date: 09/23/1986  . Smokeless tobacco: Never Used  . Alcohol Use: No    MEDICARE WELLNESS OBJECTIVES: Tobacco use: She does not smoke.  Patient is a former smoker. Alcohol Current alcohol use: none Caffeine Current caffeine use: coffee 1-2 /day Osteoporosis: postmenopausal estrogen deficiency and dietary calcium and/or vitamin D deficiency, History of fracture in the past year: no Diet: in general, a "healthy" diet   Physical activity: yard work and walking Depression/mood screen:  Yes - No Depression Hearing: normal Visual acuity: normal,  does perform annual eye exam  ADLs: self care Fall risk: Low Risk Home safety: good Cognitive Testing  Alert? Yes  Normal Appearance?Yes  Oriented to person? Yes  Place? Yes   Time? Yes  Recall of three objects?  Yes  Can perform simple calculations? Yes  Displays appropriate judgment?Yes  Can read the correct time from a watch face?Yes EOL planning: No  and Information given   Objective:   Blood  pressure 120/78, pulse 72, temperature 97.7 F (36.5 C), resp. rate 16, height 5' (1.524 m), weight 154 lb (69.854 kg). Body mass index is 30.08 kg/(m^2).  General appearance: alert, no distress, WD/WN,  female HEENT: normocephalic, sclerae anicteric, TMs pearly, nares patent, no discharge or erythema, pharynx normal Oral cavity: MMM, no lesions Neck: supple, no lymphadenopathy, no thyromegaly, no masses Heart: RRR, normal S1, S2, no murmurs Lungs: CTA bilaterally, no wheezes, rhonchi, or rales Abdomen: +bs, soft, non tender, non distended, no masses, no hepatomegaly, no splenomegaly Musculoskeletal: nontender, no swelling, no obvious deformity Extremities: no edema, no cyanosis, no clubbing Pulses: 2+ symmetric, upper and lower extremities, normal cap refill Neurological: alert, oriented x 3, CN2-12 intact, strength normal upper extremities and lower extremities, sensation decreased bilateral feet, DTRs 2+ throughout, no cerebellar signs, gait normal Psychiatric: normal affect, behavior normal, pleasant  Breast: defer Gyn: defer Rectal: defer  Medicare Attestation I have personally reviewed: The patient's medical and social history Their use of alcohol, tobacco or illicit drugs Their current medications and supplements The patient's functional ability including ADLs,fall risks, home safety risks, cognitive, and hearing and visual impairment Diet and physical activities Evidence for depression or mood disorders  The patient's weight, height, BMI, and visual acuity have been recorded in the chart.  I have made referrals, counseling, and provided education to the patient based on review of the above and I have provided the patient with a written personalized care plan for preventive services.     Vicie Mutters, PA-C   12/22/2014

## 2014-12-23 ENCOUNTER — Other Ambulatory Visit: Payer: Self-pay

## 2014-12-23 LAB — VITAMIN D 25 HYDROXY (VIT D DEFICIENCY, FRACTURES): VIT D 25 HYDROXY: 36 ng/mL (ref 30–100)

## 2014-12-23 MED ORDER — ACCU-CHEK FASTCLIX LANCETS MISC: Status: DC

## 2014-12-23 MED ORDER — GLUCOSE BLOOD VI STRP: ORAL_STRIP | Status: DC

## 2015-01-02 ENCOUNTER — Other Ambulatory Visit: Payer: Self-pay

## 2015-01-02 MED ORDER — GLUCOSE BLOOD VI STRP: ORAL_STRIP | Status: DC

## 2015-01-02 MED ORDER — FREESTYLE LANCETS MISC: Status: AC

## 2015-01-02 MED ORDER — FREESTYLE FREEDOM LITE W/DEVICE KIT: PACK | Status: DC

## 2015-01-02 MED ORDER — FREESTYLE LANCETS MISC: Status: DC

## 2015-01-04 ENCOUNTER — Other Ambulatory Visit: Payer: Self-pay

## 2015-01-04 MED ORDER — FREESTYLE FREEDOM LITE W/DEVICE KIT: PACK | Status: AC

## 2015-01-09 ENCOUNTER — Other Ambulatory Visit: Payer: Self-pay | Admitting: Internal Medicine

## 2015-01-18 DIAGNOSIS — M25512 Pain in left shoulder: Secondary | ICD-10-CM | POA: Diagnosis not present

## 2015-03-24 ENCOUNTER — Ambulatory Visit: Payer: Self-pay | Admitting: Internal Medicine

## 2015-03-31 ENCOUNTER — Encounter: Payer: Self-pay | Admitting: Internal Medicine

## 2015-03-31 ENCOUNTER — Ambulatory Visit (INDEPENDENT_AMBULATORY_CARE_PROVIDER_SITE_OTHER): Payer: Medicare Other | Admitting: Internal Medicine

## 2015-03-31 VITALS — BP 176/96 | HR 76 | Temp 97.9°F | Resp 16 | Ht 61.0 in | Wt 156.0 lb

## 2015-03-31 DIAGNOSIS — E559 Vitamin D deficiency, unspecified: Secondary | ICD-10-CM | POA: Diagnosis not present

## 2015-03-31 DIAGNOSIS — E1140 Type 2 diabetes mellitus with diabetic neuropathy, unspecified: Secondary | ICD-10-CM

## 2015-03-31 DIAGNOSIS — E785 Hyperlipidemia, unspecified: Secondary | ICD-10-CM

## 2015-03-31 DIAGNOSIS — E1149 Type 2 diabetes mellitus with other diabetic neurological complication: Secondary | ICD-10-CM

## 2015-03-31 DIAGNOSIS — Z79899 Other long term (current) drug therapy: Secondary | ICD-10-CM | POA: Insufficient documentation

## 2015-03-31 DIAGNOSIS — I10 Essential (primary) hypertension: Secondary | ICD-10-CM

## 2015-03-31 LAB — HEMOGLOBIN A1C
Hgb A1c MFr Bld: 7.4 % — ABNORMAL HIGH (ref <5.7)
Mean Plasma Glucose: 166 mg/dL — ABNORMAL HIGH (ref <117)

## 2015-03-31 LAB — HEPATIC FUNCTION PANEL
ALK PHOS: 96 U/L (ref 39–117)
ALT: 11 U/L (ref 0–35)
AST: 19 U/L (ref 0–37)
Albumin: 3.9 g/dL (ref 3.5–5.2)
BILIRUBIN DIRECT: 0.1 mg/dL (ref 0.0–0.3)
BILIRUBIN INDIRECT: 0.4 mg/dL (ref 0.2–1.2)
BILIRUBIN TOTAL: 0.5 mg/dL (ref 0.2–1.2)
Total Protein: 7.1 g/dL (ref 6.0–8.3)

## 2015-03-31 LAB — BASIC METABOLIC PANEL WITH GFR
BUN: 10 mg/dL (ref 6–23)
CO2: 27 meq/L (ref 19–32)
Calcium: 9.2 mg/dL (ref 8.4–10.5)
Chloride: 101 mEq/L (ref 96–112)
Creat: 0.64 mg/dL (ref 0.50–1.10)
GFR, Est African American: >89 mL/min
GFR, Est Non African American: >89 mL/min
GLUCOSE: 122 mg/dL — AB (ref 70–99)
Potassium: 4.3 mEq/L (ref 3.5–5.3)
Sodium: 138 mEq/L (ref 135–145)

## 2015-03-31 LAB — LIPID PANEL
Cholesterol: 205 mg/dL — ABNORMAL HIGH (ref 0–200)
HDL: 39 mg/dL — AB (ref >=46)
LDL CALC: 128 mg/dL — AB (ref 0–99)
Total CHOL/HDL Ratio: 5.3 Ratio
Triglycerides: 192 mg/dL — ABNORMAL HIGH (ref <150)
VLDL: 38 mg/dL (ref 0–40)

## 2015-03-31 LAB — CBC WITH DIFFERENTIAL/PLATELET
BASOS ABS: 0 10*3/uL (ref 0.0–0.1)
Basophils Relative: 0 % (ref 0–1)
Eosinophils Absolute: 0.2 10*3/uL (ref 0.0–0.7)
Eosinophils Relative: 4 % (ref 0–5)
HEMATOCRIT: 37.6 % (ref 36.0–46.0)
Hemoglobin: 12.3 g/dL (ref 12.0–15.0)
LYMPHS PCT: 49 % — AB (ref 12–46)
Lymphs Abs: 2.2 10*3/uL (ref 0.7–4.0)
MCH: 27.4 pg (ref 26.0–34.0)
MCHC: 32.7 g/dL (ref 30.0–36.0)
MCV: 83.7 fL (ref 78.0–100.0)
MPV: 9.9 fL (ref 8.6–12.4)
Monocytes Absolute: 0.4 10*3/uL (ref 0.1–1.0)
Monocytes Relative: 8 % (ref 3–12)
NEUTROS PCT: 39 % — AB (ref 43–77)
Neutro Abs: 1.8 10*3/uL (ref 1.7–7.7)
PLATELETS: 305 10*3/uL (ref 150–400)
RBC: 4.49 MIL/uL (ref 3.87–5.11)
RDW: 14 % (ref 11.5–15.5)
WBC: 4.5 10*3/uL (ref 4.0–10.5)

## 2015-03-31 LAB — MAGNESIUM: Magnesium: 2 mg/dL (ref 1.5–2.5)

## 2015-03-31 LAB — TSH: TSH: 1.54 u[IU]/mL (ref 0.350–4.500)

## 2015-03-31 NOTE — Progress Notes (Signed)
Patient ID: Nancy Myers, female   DOB: 1946-09-09, 69 y.o.   MRN: [id number]    S:     No problems reported. Had to leave for a funeral   O:    BP 176/96 mmHg  Pulse 76  Temp(Src) 97.9 F (36.6 C)  Resp 16  Ht 5\' 1"  (1.549 m)  Wt 156 lb (70.761 kg)  BMI 29.49 kg/m2  A:    1. Essential hypertension  - TSH  2. Hyperlipidemia  - Lipid panel  3. T2_NIDDM w/ Peripheral Sensory Neuropathy  - Hemoglobin A1c - Insulin, random  4. Vitamin D deficiency  - Vit D  25 hydroxy   5. Medication management  - CBC with Differential/Platelet - BASIC METABOLIC PANEL WITH GFR - Hepatic function panel - Magnesium  Plan: f/u pending labs

## 2015-04-01 LAB — VITAMIN D 25 HYDROXY (VIT D DEFICIENCY, FRACTURES): VIT D 25 HYDROXY: 30 ng/mL (ref 30–100)

## 2015-04-01 LAB — INSULIN, RANDOM: Insulin: 8.3 u[IU]/mL (ref 2.0–19.6)

## 2015-04-05 ENCOUNTER — Other Ambulatory Visit: Payer: Self-pay

## 2015-04-05 ENCOUNTER — Other Ambulatory Visit: Payer: Self-pay | Admitting: *Deleted

## 2015-04-05 DIAGNOSIS — F329 Major depressive disorder, single episode, unspecified: Secondary | ICD-10-CM

## 2015-04-05 DIAGNOSIS — F32A Depression, unspecified: Secondary | ICD-10-CM

## 2015-04-05 MED ORDER — FLUOXETINE HCL 20 MG PO CAPS: 20.0000 mg | ORAL_CAPSULE | Freq: Every day | ORAL | Status: DC

## 2015-04-05 MED ORDER — CARISOPRODOL 250 MG PO TABS: 250.0000 mg | ORAL_TABLET | Freq: Four times a day (QID) | ORAL | Status: DC | PRN

## 2015-04-06 DIAGNOSIS — M25512 Pain in left shoulder: Secondary | ICD-10-CM | POA: Diagnosis not present

## 2015-04-10 ENCOUNTER — Other Ambulatory Visit: Payer: Self-pay | Admitting: Orthopedic Surgery

## 2015-04-10 DIAGNOSIS — M25512 Pain in left shoulder: Secondary | ICD-10-CM

## 2015-04-11 ENCOUNTER — Other Ambulatory Visit: Payer: Self-pay | Admitting: Physician Assistant

## 2015-04-11 DIAGNOSIS — F32A Depression, unspecified: Secondary | ICD-10-CM

## 2015-04-11 DIAGNOSIS — F329 Major depressive disorder, single episode, unspecified: Secondary | ICD-10-CM

## 2015-04-11 MED ORDER — FLUOXETINE HCL 20 MG PO CAPS: 20.0000 mg | ORAL_CAPSULE | Freq: Every day | ORAL | Status: DC

## 2015-04-13 ENCOUNTER — Encounter: Payer: Self-pay | Admitting: Internal Medicine

## 2015-04-13 ENCOUNTER — Ambulatory Visit (INDEPENDENT_AMBULATORY_CARE_PROVIDER_SITE_OTHER): Payer: Medicare Other | Admitting: Physician Assistant

## 2015-04-13 VITALS — BP 132/86 | HR 88 | Temp 97.3°F | Resp 16 | Ht 61.0 in | Wt 156.0 lb

## 2015-04-13 DIAGNOSIS — E559 Vitamin D deficiency, unspecified: Secondary | ICD-10-CM

## 2015-04-13 DIAGNOSIS — E1149 Type 2 diabetes mellitus with other diabetic neurological complication: Secondary | ICD-10-CM

## 2015-04-13 DIAGNOSIS — E785 Hyperlipidemia, unspecified: Secondary | ICD-10-CM

## 2015-04-13 DIAGNOSIS — E1140 Type 2 diabetes mellitus with diabetic neuropathy, unspecified: Secondary | ICD-10-CM

## 2015-04-13 DIAGNOSIS — I10 Essential (primary) hypertension: Secondary | ICD-10-CM

## 2015-04-13 DIAGNOSIS — Z79899 Other long term (current) drug therapy: Secondary | ICD-10-CM

## 2015-04-13 NOTE — Patient Instructions (Signed)
Increase metformin to 3 a day for a month, and then go up to 4 a day.  We are starting you on Metformin to prevent or treat diabetes. Metformin does not cause low blood sugars. In order to create energy your cells need insulin and sugar but sometime your cells do not accept the insulin and this can cause increased sugars and decreased energy. The Metformin helps your cells accept insulin and the sugar to give you more energy.   The two most common side effects are nausea and diarrhea, follow these rules to avoid it! You can take imodium per box instructions when starting metformin if needed.   Rules of metformin: 1) start out slow with only one pill daily. Our goal for you is 4 pills a day or 2000mg  total.  2) take with your largest meal. 3) Take with least amount of carbs.   Call if you have any problems.    Vitamin D goal is between 60-80  Please make sure that you are taking your Vitamin D as directed.   It is very important as a natural anti-inflammatory   helping hair, skin, and nails, as well as reducing stroke and heart attack risk.   It helps your bones and helps with mood.  It also decreases numerous cancer risks so please take it as directed.   Low Vit D is associated with a 200-300% higher risk for CANCER   and 200-300% higher risk for HEART   ATTACK  &  STROKE.    .....................................Marland Kitchen  It is also associated with higher death rate at younger ages,   autoimmune diseases like Rheumatoid arthritis, Lupus, Multiple Sclerosis.     Also many other serious conditions, like depression, Alzheimer's  Dementia, infertility, muscle aches, fatigue, fibromyalgia - just to name a few.  +++++++++++++++++++   Diabetes is a very complicated disease...lets simplify it.  An easy way to look at it to understand the complications is if you think of the extra sugar floating in your blood stream as glass shards floating through your blood stream.    Diabetes affects  your small vessels first: 1) The glass shards (sugar) scraps down the tiny blood vessels in your eyes and lead to diabetic retinopathy, the leading cause of blindness in the [place]. Diabetes is the leading cause of newly diagnosed adult (107 to 69 years of age) blindness in the [place].  2) The glass shards scratches down the tiny vessels of your legs leading to nerve damage called neuropathy and can lead to amputations of your feet. More than 60% of all non-traumatic amputations of lower limbs occur in people with diabetes.  3) Over time the small vessels in your brain are shredded and closed off, individually this does not cause any problems but over a long period of time many of the small vessels being blocked can lead to Vascular Dementia.   4) Your kidney's are a filter system and have a "net" that keeps certain things in the body and lets bad things out. Sugar shreds this net and leads to kidney damage and eventually failure. Decreasing the sugar that is destroying the net and certain blood pressure medications can help stop or decrease progression of kidney disease. Diabetes was the primary cause of kidney failure in 44 percent of all new cases in 2011.  5) Diabetes also destroys the small vessels in your penis that lead to erectile dysfunction. Eventually the vessels are so damaged that you may not be responsive to cialis  or viagra.   Diabetes and your large vessels: Your larger vessels consist of your coronary arteries in your heart and the carotid vessels to your brain. Diabetes or even increased sugars put you at 300% increased risk of heart attack and stroke and this is why.. The sugar scrapes down your large blood vessels and your body sees this as an internal injury and tries to repair itself. Just like you get a scab on your skin, your platelets will stick to the blood vessel wall trying to heal it. This is why we have diabetics on low dose aspirin daily, this prevents the platelets from  sticking and can prevent plaque formation. In addition, your body takes cholesterol and tries to shove it into the open wound. This is why we want your LDL, or bad cholesterol, below 70.   The combination of platelets and cholesterol over 5-10 years forms plaque that can break off and cause a heart attack or stroke.   PLEASE REMEMBER:  Diabetes is preventable! Up to 69 percent of complications and morbidities among individuals with type 2 diabetes can be prevented, delayed, or effectively treated and minimized with regular visits to a health professional, appropriate monitoring and medication, and a healthy diet and lifestyle.  Recommendations For Diabetic/Prediabetic Patients:   -  Take medications as prescribed  -  Recommend Dr Fara Olden Fuhrman's book "The End of Diabetes "  And "The End of Dieting"- Can get at  www.[place].com and encourage also get the Audio CD book  - AVOID Animal products, ie. Meat - red/white, Poultry and Dairy/especially cheese - Exercise at least 5 times a week for 30 minutes or preferably daily.  - No Smoking - Drink less than 2 drinks a day.  - Monitor your feet for sores - Have yearly Eye Exams - Recommend annual Flu vaccine  - Recommend Pneumovax and Prevnar vaccines - Shingles Vaccine (Zostavax) if over 65 y.o.  Goals:   - BMI less than 24 - Fasting sugar less than 130 or less than 150 if tapering medicines to lose weight  - Systolic BP less than 474  - Diastolic BP less than 80 - Bad LDL Cholesterol less than 70 - Triglycerides less than 150

## 2015-04-13 NOTE — Progress Notes (Signed)
Assessment and Plan:  Hypertension: Continue medication, monitor blood pressure at home. Continue DASH diet. Cholesterol: Continue diet and exercise. Check cholesterol.  Diabetes with p. Neuropathy -Continue diet and exercise. Check A1C, will increase MF to 4 a day and concentrate on strict diet, recheck 3 months.  Vitamin D Def- check level and continue medications.  Obesity with co morbidities- long discussion about weight loss, diet, and exercise   Continue diet and meds as discussed. Further disposition pending results of labs. Discussed med's effects and SE's.  Dr. Melford Aase present for exam and agrees with assessment.   Future Appointments Date Time Provider [place]  04/21/2015 3:30 PM [facility]315 DG C-ARM RM 3 GI-315DG [facility] W. [facility]  04/21/2015 4:00 PM GI-315 MR 2 GI-315MRI [facility]315 W. [facility]  09/27/2015 10:00 AM Vicie Mutters, PA-C [id number] None     HPI 69 y.o. female  presents for 3 month follow up with hypertension, hyperlipidemia, diabetes and vitamin D. Labs were drawn on the 8th, she is here for the visit.  Her blood pressure has been controlled at home, today their BP is BP: 132/86 mmHg She does workout. She denies chest pain, shortness of breath, dizziness.  She is on cholesterol medication and denies myalgias. She is eating more veggies and has cut back on meat, only eating chicken and salmon occ.  Her cholesterol is at goal. The cholesterol last visit was:   Lab Results  Component Value Date   CHOL 205* 03/31/2015   HDL 39* 03/31/2015   LDLCALC 128* 03/31/2015   TRIG 192* 03/31/2015   CHOLHDL 5.3 03/31/2015   She has been working on diet and exercise for Diabetes with p. neuropathy she is on bASA, she is on ARB, only on metformin 2 a day and denies hypoglycemia , polydipsia and polyuria. Last A1C in the office was:  Lab Results  Component Value Date   HGBA1C 7.4* 03/31/2015   Patient is on Vitamin D supplement. Lab Results  Component Value Date   VD25OH 30  03/31/2015   She complains of left shoulder pain since April. She went to Dr. Forbes Cellar, had a cortisone shot that lasted 2 weeks and it has gotten worse. Went to him last week and she is getting an MRI of her left shoulder next week then follow up with him August 1st.  BMI is Body mass index is 29.49 kg/(m^2)., she is working on diet and exercise. Wt Readings from Last 3 Encounters:  04/13/15 156 lb (70.761 kg)  03/31/15 156 lb (70.761 kg)  12/22/14 154 lb (69.854 kg)    Current Medications:  Current Outpatient Prescriptions on File Prior to Visit  Medication Sig Dispense Refill  . albuterol (PROVENTIL HFA;VENTOLIN HFA) 108 (90 BASE) MCG/ACT inhaler 2 puffs every 6 hours if needed- rescue inhaler 3 Inhaler 3  . aspirin 81 MG chewable tablet Chew 162 mg by mouth daily.     Marland Kitchen atorvastatin (LIPITOR) 40 MG tablet Take 1 tablet (40 mg total) by mouth daily. 90 tablet 3  . Blood Glucose Monitoring Suppl (FREESTYLE FREEDOM LITE) W/DEVICE KIT Check Blood Sugar One Time Daily 1 each PRN  . budesonide (PULMICORT) 0.5 MG/2ML nebulizer solution USE 1 VIAL VIA NEBULIZER TWICE A DAY 120 mL 2  . carisoprodol (SOMA) 250 MG tablet Take 1 tablet (250 mg total) by mouth every 6 (six) hours as needed. 180 tablet 3  . cetirizine (ZYRTEC) 10 MG tablet Take 10 mg by mouth daily.    . cyanocobalamin (,VITAMIN B-12,) 1000 MCG/ML  injection Inject 1 mL (1,000 mcg total) into the muscle every 30 (thirty) days. 10 mL 3  . FLUoxetine (PROZAC) 20 MG capsule Take 1 capsule (20 mg total) by mouth daily. 90 capsule 3  . glucose blood test strip Test Blood Sugar One Time Daily 300 each PRN  . hydrochlorothiazide (HYDRODIURIL) 25 MG tablet Take 1 tablet (25 mg total) by mouth daily. 90 tablet 3  . hydrocortisone (ANUSOL-HC) 25 MG suppository Place 1 suppository (25 mg total) rectally 2 (two) times daily. 100 suppository 0  . ipratropium-albuterol (DUONEB) 0.5-2.5 (3) MG/3ML SOLN USE 1 VIA VIA NEBULIZER 4 TIMES A DAY 360 mL 2  .  Lancets (FREESTYLE) lancets Test Blood Sugar One Time Daily 100 each PRN  . losartan (COZAAR) 100 MG tablet Take 1 tablet (100 mg total) by mouth daily. 90 tablet 3  . metFORMIN (GLUCOPHAGE) 500 MG tablet Take 2 tablets (1,000 mg total) by mouth daily with breakfast. 180 tablet 3  . metoprolol (TOPROL-XL) 200 MG 24 hr tablet Take 1 tablet (200 mg total) by mouth daily. 90 tablet 3  . Misc Natural Products (OSTEO BI-FLEX JOINT SHIELD PO) Take by mouth.    Marland Kitchen OVER THE COUNTER MEDICATION OTC slow iron 1 daily    . OVER THE COUNTER MEDICATION OTC fiber laxative daily    . SYRINGE-NEEDLE, DISP, 3 ML 25G X 5/8" 3 ML MISC Use as directed to inject B12 monthly 100 each 0  . vitamin C (ASCORBIC ACID) 500 MG tablet Take 500 mg by mouth daily.    . Magnesium 500 MG TABS Take 500 mg by mouth daily.      No current facility-administered medications on file prior to visit.   Medical History:  Past Medical History  Diagnosis Date  . Hypertension   . Hyperlipidemia   . Diabetes mellitus without complication   . GERD (gastroesophageal reflux disease)   . Anxiety   . Depression   . Anemia   . IBS (irritable bowel syndrome)   . Vitamin D deficiency   . Cancer 1995    Colon Cancer  . Arrhythmia    Allergies:  Allergies  Allergen Reactions  . Codeine Itching   Review of Systems  Constitutional: Positive for malaise/fatigue. Negative for fever, weight loss and diaphoresis.  HENT: Negative.   Eyes: Negative.   Respiratory: Negative.   Cardiovascular: Negative.   Gastrointestinal: Negative.   Genitourinary: Negative.   Musculoskeletal: Negative.   Skin: Negative.   Neurological: Positive for tingling and sensory change. Negative for dizziness, tremors, speech change, focal weakness, seizures, loss of consciousness and weakness.  Psychiatric/Behavioral: Negative.      Family history- Review and unchanged Social history- Review and unchanged Physical Exam: BP 132/86 mmHg  Pulse 88   Temp(Src) 97.3 F (36.3 C)  Resp 16  Ht 5' 1"  (1.549 m)  Wt 156 lb (70.761 kg)  BMI 29.49 kg/m2 Wt Readings from Last 3 Encounters:  04/13/15 156 lb (70.761 kg)  03/31/15 156 lb (70.761 kg)  12/22/14 154 lb (69.854 kg)   General Appearance: Well nourished, in no apparent distress. Eyes: PERRLA, EOMs, conjunctiva no swelling or erythema Sinuses: No Frontal/maxillary tenderness ENT/Mouth: Ext aud canals clear, TMs without erythema, bulging. No erythema, swelling, or exudate on post pharynx.  Tonsils not swollen or erythematous. Hearing normal.  Neck: Supple, thyroid normal.  Respiratory: Respiratory effort normal, BS equal bilaterally without rales, rhonchi, wheezing or stridor.  Cardio: RRR with no MRGs. Brisk peripheral pulses without edema.  Abdomen: Soft, + BS.  Non tender, no guarding, rebound, hernias, masses. Lymphatics: Non tender without lymphadenopathy.  Musculoskeletal: Full ROM, 5/5 strength, normal gait.  Skin: Warm, dry without rashes, lesions, ecchymosis.  Neuro: Cranial nerves intact. No cerebellar symptoms. Sensation intact.  Psych: Awake and oriented X 3, normal affect, Insight and Judgment appropriate.    Vicie Mutters 11:29 AM

## 2015-04-21 ENCOUNTER — Ambulatory Visit
Admission: RE | Admit: 2015-04-21 | Discharge: 2015-04-21 | Disposition: A | Discharge status: Home / Self Care | Payer: Medicare Other | Source: Ambulatory Visit | Attending: Orthopedic Surgery | Admitting: Orthopedic Surgery

## 2015-04-21 DIAGNOSIS — M75102 Unspecified rotator cuff tear or rupture of left shoulder, not specified as traumatic: Secondary | ICD-10-CM | POA: Diagnosis not present

## 2015-04-21 DIAGNOSIS — M25512 Pain in left shoulder: Secondary | ICD-10-CM

## 2015-04-21 DIAGNOSIS — M19012 Primary osteoarthritis, left shoulder: Secondary | ICD-10-CM | POA: Diagnosis not present

## 2015-04-21 MED ORDER — IOHEXOL 180 MG/ML  SOLN
12.0000 mL | Freq: Once | INTRAMUSCULAR | Status: AC | PRN
  Administered 2015-04-21: 12 mL via INTRA_ARTICULAR

## 2015-04-24 DIAGNOSIS — M25512 Pain in left shoulder: Secondary | ICD-10-CM | POA: Diagnosis not present

## 2015-05-08 ENCOUNTER — Ambulatory Visit: Admit: 2015-05-08 | Payer: Self-pay | Admitting: Orthopedic Surgery

## 2015-05-08 DIAGNOSIS — M24112 Other articular cartilage disorders, left shoulder: Secondary | ICD-10-CM | POA: Diagnosis not present

## 2015-05-08 DIAGNOSIS — M7542 Impingement syndrome of left shoulder: Secondary | ICD-10-CM | POA: Diagnosis not present

## 2015-05-08 DIAGNOSIS — M7522 Bicipital tendinitis, left shoulder: Secondary | ICD-10-CM | POA: Diagnosis not present

## 2015-05-08 DIAGNOSIS — M7552 Bursitis of left shoulder: Secondary | ICD-10-CM | POA: Diagnosis not present

## 2015-05-08 DIAGNOSIS — M75122 Complete rotator cuff tear or rupture of left shoulder, not specified as traumatic: Secondary | ICD-10-CM | POA: Diagnosis not present

## 2015-05-08 DIAGNOSIS — G8918 Other acute postprocedural pain: Secondary | ICD-10-CM | POA: Diagnosis not present

## 2015-05-08 SURGERY — SHOULDER ARTHROSCOPY WITH SUBACROMIAL DECOMPRESSION, ROTATOR CUFF REPAIR AND BICEP TENDON REPAIR
Anesthesia: General | Site: Shoulder | Laterality: Left

## 2015-06-09 DIAGNOSIS — S43422D Sprain of left rotator cuff capsule, subsequent encounter: Secondary | ICD-10-CM | POA: Diagnosis not present

## 2015-06-09 DIAGNOSIS — M25512 Pain in left shoulder: Secondary | ICD-10-CM | POA: Diagnosis not present

## 2015-06-09 DIAGNOSIS — M75102 Unspecified rotator cuff tear or rupture of left shoulder, not specified as traumatic: Secondary | ICD-10-CM | POA: Diagnosis not present

## 2015-06-09 DIAGNOSIS — M6281 Muscle weakness (generalized): Secondary | ICD-10-CM | POA: Diagnosis not present

## 2015-06-12 DIAGNOSIS — M25512 Pain in left shoulder: Secondary | ICD-10-CM | POA: Diagnosis not present

## 2015-06-12 DIAGNOSIS — S43422D Sprain of left rotator cuff capsule, subsequent encounter: Secondary | ICD-10-CM | POA: Diagnosis not present

## 2015-06-12 DIAGNOSIS — M6281 Muscle weakness (generalized): Secondary | ICD-10-CM | POA: Diagnosis not present

## 2015-06-12 DIAGNOSIS — M75102 Unspecified rotator cuff tear or rupture of left shoulder, not specified as traumatic: Secondary | ICD-10-CM | POA: Diagnosis not present

## 2015-06-15 DIAGNOSIS — S43422D Sprain of left rotator cuff capsule, subsequent encounter: Secondary | ICD-10-CM | POA: Diagnosis not present

## 2015-06-15 DIAGNOSIS — M6281 Muscle weakness (generalized): Secondary | ICD-10-CM | POA: Diagnosis not present

## 2015-06-15 DIAGNOSIS — M25512 Pain in left shoulder: Secondary | ICD-10-CM | POA: Diagnosis not present

## 2015-06-15 DIAGNOSIS — M75102 Unspecified rotator cuff tear or rupture of left shoulder, not specified as traumatic: Secondary | ICD-10-CM | POA: Diagnosis not present

## 2015-06-22 DIAGNOSIS — M6281 Muscle weakness (generalized): Secondary | ICD-10-CM | POA: Diagnosis not present

## 2015-06-22 DIAGNOSIS — S43422D Sprain of left rotator cuff capsule, subsequent encounter: Secondary | ICD-10-CM | POA: Diagnosis not present

## 2015-06-22 DIAGNOSIS — M25512 Pain in left shoulder: Secondary | ICD-10-CM | POA: Diagnosis not present

## 2015-06-22 DIAGNOSIS — M75102 Unspecified rotator cuff tear or rupture of left shoulder, not specified as traumatic: Secondary | ICD-10-CM | POA: Diagnosis not present

## 2015-06-27 DIAGNOSIS — M25512 Pain in left shoulder: Secondary | ICD-10-CM | POA: Diagnosis not present

## 2015-06-27 DIAGNOSIS — S43422D Sprain of left rotator cuff capsule, subsequent encounter: Secondary | ICD-10-CM | POA: Diagnosis not present

## 2015-06-27 DIAGNOSIS — M75102 Unspecified rotator cuff tear or rupture of left shoulder, not specified as traumatic: Secondary | ICD-10-CM | POA: Diagnosis not present

## 2015-06-27 DIAGNOSIS — M6281 Muscle weakness (generalized): Secondary | ICD-10-CM | POA: Diagnosis not present

## 2015-06-27 IMAGING — CR DG CHEST 2V
2 series · 2 of 2 positions shown · non-contrast
Comparison: 09/03/2011

CLINICAL DATA: Discomfort at left upper chest for 1 month. Family
history of heart disease. Diabetic.

EXAM:
CHEST  2 VIEW

[w chest pa]
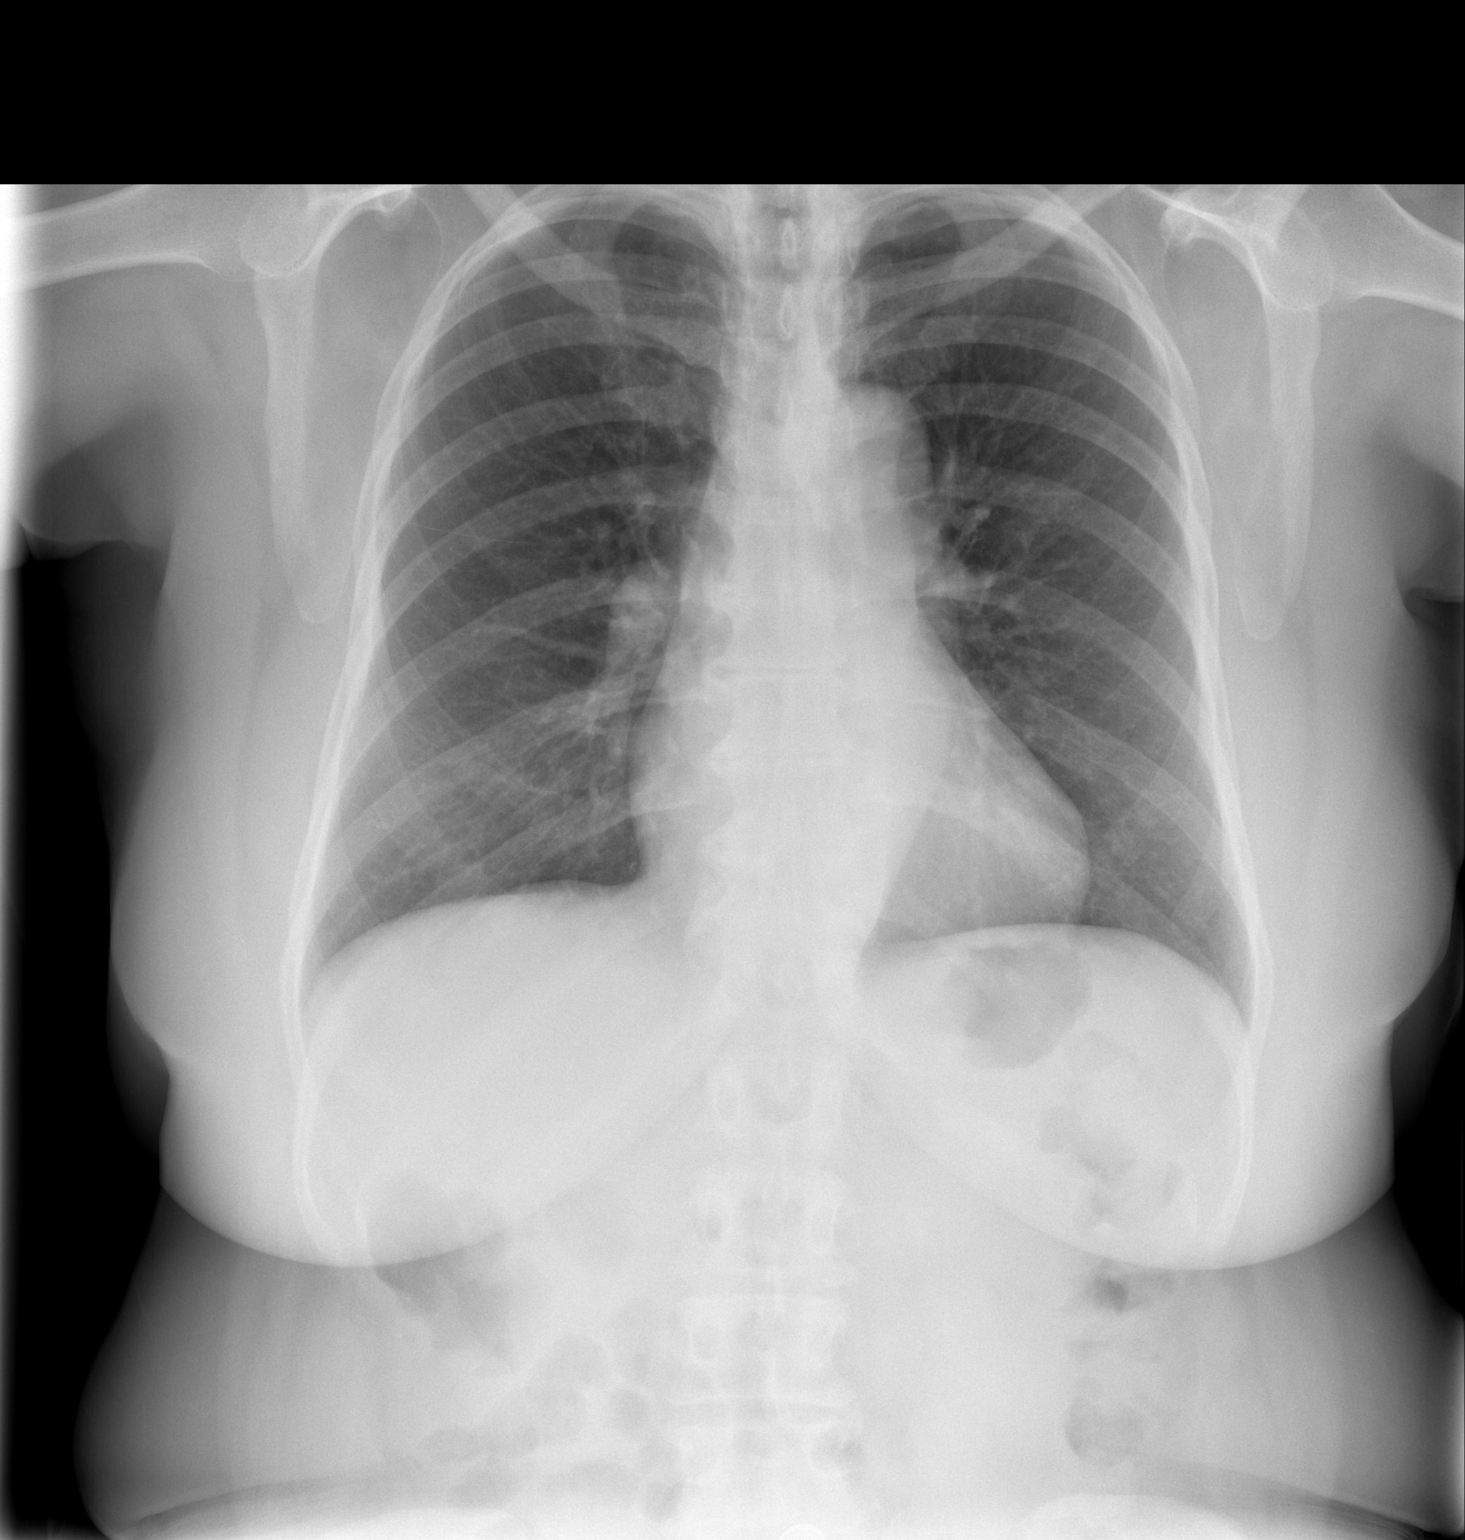

[w chest lat]
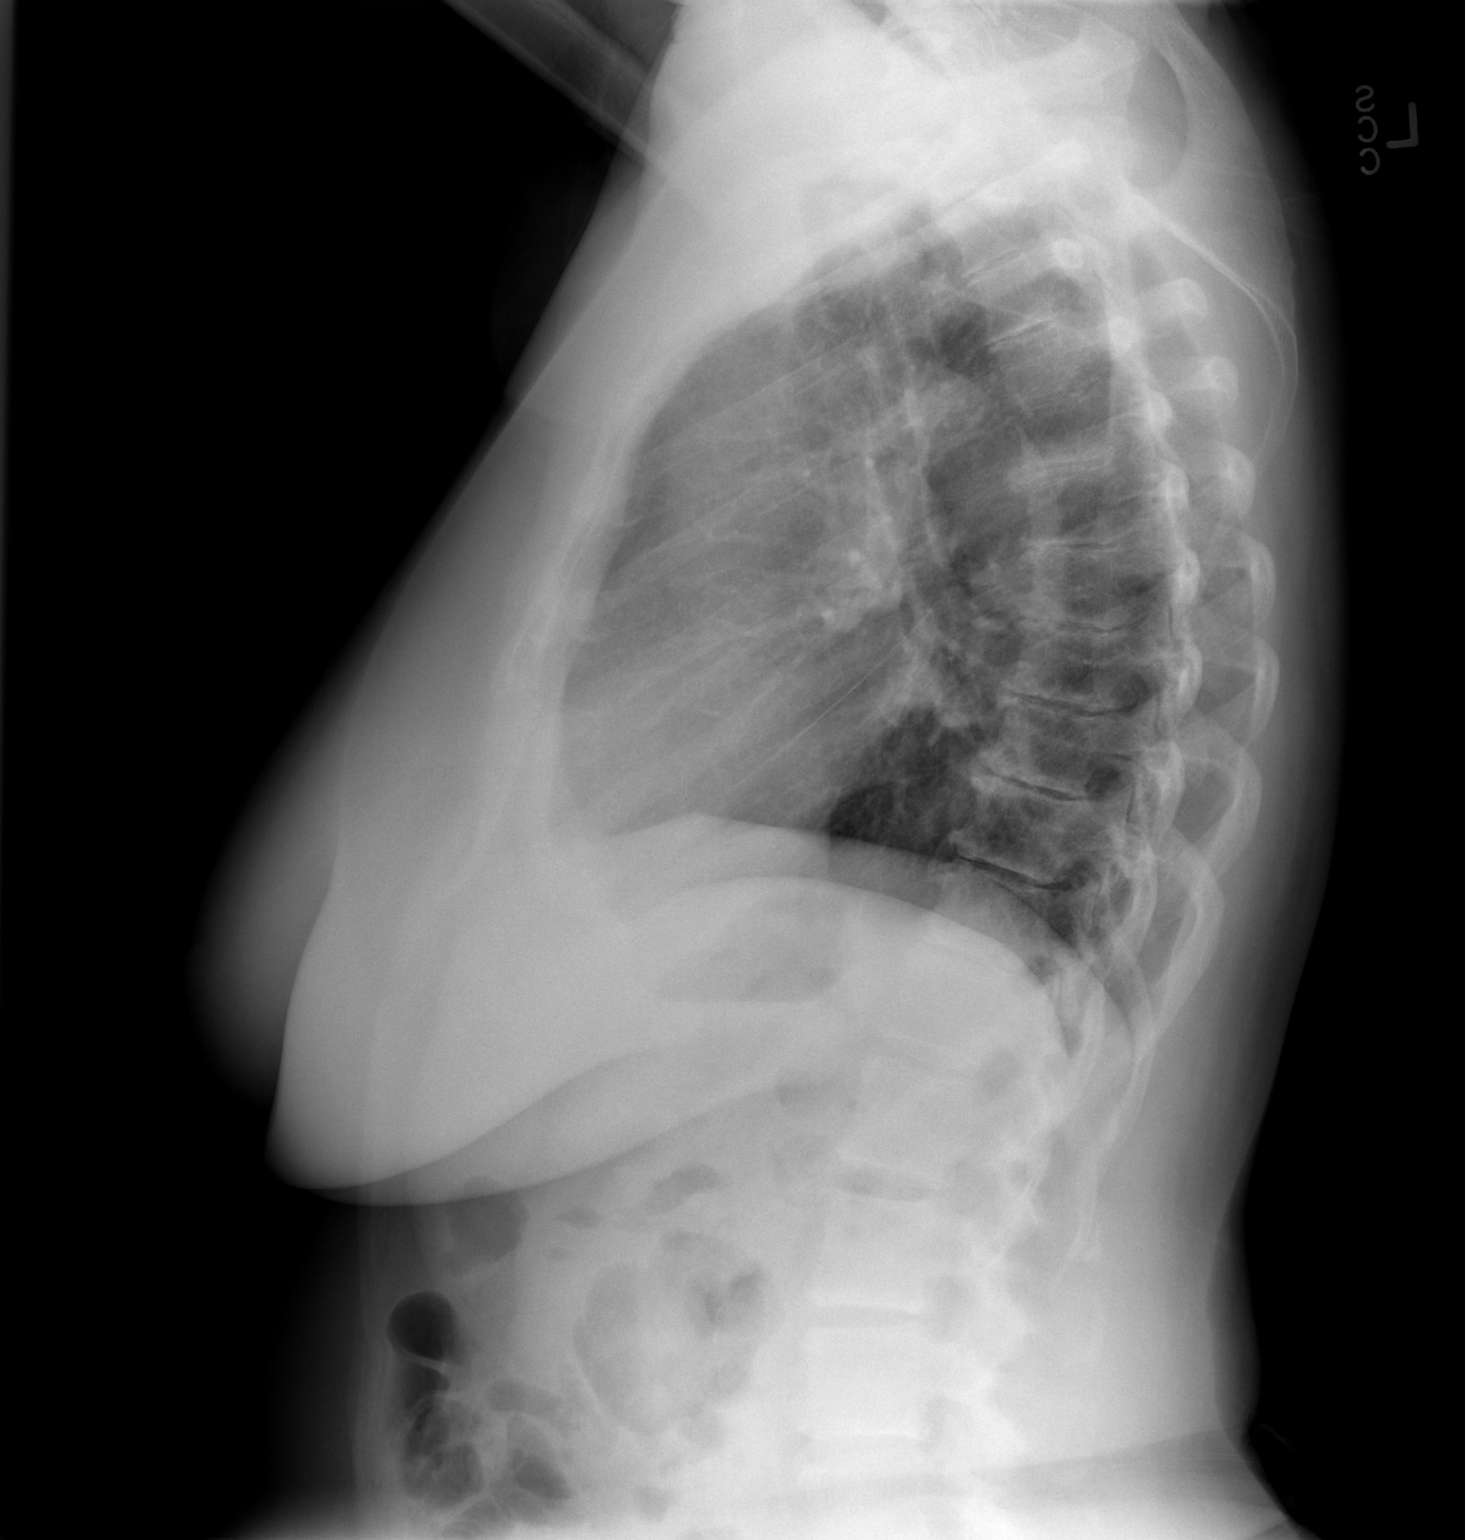

[2 of 2 positions shown; findings below may reference images not displayed]

FINDINGS: Mid thoracic spondylosis which is moderate. Midline trachea. Normal
heart size. Mildly tortuous descending thoracic aorta. No pleural
effusion or pneumothorax. Clear lungs.
IMPRESSION: No acute cardiopulmonary disease.

## 2015-06-29 DIAGNOSIS — M25512 Pain in left shoulder: Secondary | ICD-10-CM | POA: Diagnosis not present

## 2015-06-29 DIAGNOSIS — M6281 Muscle weakness (generalized): Secondary | ICD-10-CM | POA: Diagnosis not present

## 2015-06-29 DIAGNOSIS — S43422D Sprain of left rotator cuff capsule, subsequent encounter: Secondary | ICD-10-CM | POA: Diagnosis not present

## 2015-06-29 DIAGNOSIS — M75102 Unspecified rotator cuff tear or rupture of left shoulder, not specified as traumatic: Secondary | ICD-10-CM | POA: Diagnosis not present

## 2015-07-04 DIAGNOSIS — M25512 Pain in left shoulder: Secondary | ICD-10-CM | POA: Diagnosis not present

## 2015-07-04 DIAGNOSIS — S43422D Sprain of left rotator cuff capsule, subsequent encounter: Secondary | ICD-10-CM | POA: Diagnosis not present

## 2015-07-04 DIAGNOSIS — M6281 Muscle weakness (generalized): Secondary | ICD-10-CM | POA: Diagnosis not present

## 2015-07-04 DIAGNOSIS — M75102 Unspecified rotator cuff tear or rupture of left shoulder, not specified as traumatic: Secondary | ICD-10-CM | POA: Diagnosis not present

## 2015-07-06 DIAGNOSIS — M6281 Muscle weakness (generalized): Secondary | ICD-10-CM | POA: Diagnosis not present

## 2015-07-06 DIAGNOSIS — M75102 Unspecified rotator cuff tear or rupture of left shoulder, not specified as traumatic: Secondary | ICD-10-CM | POA: Diagnosis not present

## 2015-07-06 DIAGNOSIS — M25512 Pain in left shoulder: Secondary | ICD-10-CM | POA: Diagnosis not present

## 2015-07-06 DIAGNOSIS — S43422D Sprain of left rotator cuff capsule, subsequent encounter: Secondary | ICD-10-CM | POA: Diagnosis not present

## 2015-07-19 ENCOUNTER — Ambulatory Visit (INDEPENDENT_AMBULATORY_CARE_PROVIDER_SITE_OTHER): Payer: Medicare Other | Admitting: Physician Assistant

## 2015-07-19 ENCOUNTER — Encounter: Payer: Self-pay | Admitting: Physician Assistant

## 2015-07-19 VITALS — BP 140/90 | HR 101 | Temp 97.0°F | Resp 14 | Ht 61.0 in | Wt 153.0 lb

## 2015-07-19 DIAGNOSIS — Z79899 Other long term (current) drug therapy: Secondary | ICD-10-CM | POA: Diagnosis not present

## 2015-07-19 DIAGNOSIS — Z23 Encounter for immunization: Secondary | ICD-10-CM

## 2015-07-19 DIAGNOSIS — K2100 Gastro-esophageal reflux disease with esophagitis, without bleeding: Secondary | ICD-10-CM

## 2015-07-19 DIAGNOSIS — Z9114 Patient's other noncompliance with medication regimen: Secondary | ICD-10-CM

## 2015-07-19 DIAGNOSIS — F329 Major depressive disorder, single episode, unspecified: Secondary | ICD-10-CM | POA: Diagnosis not present

## 2015-07-19 DIAGNOSIS — J45909 Unspecified asthma, uncomplicated: Secondary | ICD-10-CM

## 2015-07-19 DIAGNOSIS — I10 Essential (primary) hypertension: Secondary | ICD-10-CM | POA: Diagnosis not present

## 2015-07-19 DIAGNOSIS — D509 Iron deficiency anemia, unspecified: Secondary | ICD-10-CM | POA: Diagnosis not present

## 2015-07-19 DIAGNOSIS — E785 Hyperlipidemia, unspecified: Secondary | ICD-10-CM | POA: Diagnosis not present

## 2015-07-19 DIAGNOSIS — S43422D Sprain of left rotator cuff capsule, subsequent encounter: Secondary | ICD-10-CM | POA: Diagnosis not present

## 2015-07-19 DIAGNOSIS — E1149 Type 2 diabetes mellitus with other diabetic neurological complication: Secondary | ICD-10-CM | POA: Diagnosis not present

## 2015-07-19 DIAGNOSIS — E559 Vitamin D deficiency, unspecified: Secondary | ICD-10-CM

## 2015-07-19 DIAGNOSIS — Z85038 Personal history of other malignant neoplasm of large intestine: Secondary | ICD-10-CM

## 2015-07-19 DIAGNOSIS — M6281 Muscle weakness (generalized): Secondary | ICD-10-CM | POA: Diagnosis not present

## 2015-07-19 DIAGNOSIS — K589 Irritable bowel syndrome without diarrhea: Secondary | ICD-10-CM

## 2015-07-19 DIAGNOSIS — E538 Deficiency of other specified B group vitamins: Secondary | ICD-10-CM

## 2015-07-19 DIAGNOSIS — M75102 Unspecified rotator cuff tear or rupture of left shoulder, not specified as traumatic: Secondary | ICD-10-CM | POA: Diagnosis not present

## 2015-07-19 DIAGNOSIS — M25512 Pain in left shoulder: Secondary | ICD-10-CM | POA: Diagnosis not present

## 2015-07-19 DIAGNOSIS — F32A Depression, unspecified: Secondary | ICD-10-CM

## 2015-07-19 LAB — CBC WITH DIFFERENTIAL/PLATELET
Basophils Absolute: 0 10*3/uL (ref 0.0–0.1)
Basophils Relative: 0 % (ref 0–1)
Eosinophils Absolute: 0.1 10*3/uL (ref 0.0–0.7)
Eosinophils Relative: 2 % (ref 0–5)
HCT: 36.9 % (ref 36.0–46.0)
HEMOGLOBIN: 12.4 g/dL (ref 12.0–15.0)
LYMPHS PCT: 44 % (ref 12–46)
Lymphs Abs: 2.2 10*3/uL (ref 0.7–4.0)
MCH: 27.3 pg (ref 26.0–34.0)
MCHC: 33.6 g/dL (ref 30.0–36.0)
MCV: 81.1 fL (ref 78.0–100.0)
MPV: 9.9 fL (ref 8.6–12.4)
Monocytes Absolute: 0.5 10*3/uL (ref 0.1–1.0)
Monocytes Relative: 10 % (ref 3–12)
NEUTROS ABS: 2.2 10*3/uL (ref 1.7–7.7)
Neutrophils Relative %: 44 % (ref 43–77)
Platelets: 318 10*3/uL (ref 150–400)
RBC: 4.55 MIL/uL (ref 3.87–5.11)
RDW: 14 % (ref 11.5–15.5)
WBC: 4.9 10*3/uL (ref 4.0–10.5)

## 2015-07-19 LAB — BASIC METABOLIC PANEL WITH GFR
BUN: 8 mg/dL (ref 7–25)
CO2: 31 mmol/L (ref 20–31)
Calcium: 9.8 mg/dL (ref 8.6–10.4)
Chloride: 100 mmol/L (ref 98–110)
Creat: 0.57 mg/dL (ref 0.50–0.99)
Glucose, Bld: 119 mg/dL — ABNORMAL HIGH (ref 65–99)
POTASSIUM: 4.4 mmol/L (ref 3.5–5.3)
Sodium: 142 mmol/L (ref 135–146)

## 2015-07-19 LAB — HEPATIC FUNCTION PANEL
ALK PHOS: 102 U/L (ref 33–130)
ALT: 9 U/L (ref 6–29)
AST: 17 U/L (ref 10–35)
Albumin: 3.8 g/dL (ref 3.6–5.1)
BILIRUBIN INDIRECT: 0.5 mg/dL (ref 0.2–1.2)
Bilirubin, Direct: 0.1 mg/dL (ref <=0.2)
Total Bilirubin: 0.6 mg/dL (ref 0.2–1.2)
Total Protein: 7.1 g/dL (ref 6.1–8.1)

## 2015-07-19 LAB — LIPID PANEL
CHOL/HDL RATIO: 4.5 ratio (ref <=5.0)
CHOLESTEROL: 168 mg/dL (ref 125–200)
HDL: 37 mg/dL — ABNORMAL LOW (ref >=46)
LDL Cholesterol: 94 mg/dL (ref <130)
TRIGLYCERIDES: 184 mg/dL — AB (ref <150)
VLDL: 37 mg/dL — AB (ref <30)

## 2015-07-19 LAB — IRON AND TIBC
%SAT: 26 % (ref 11–50)
Iron: 80 ug/dL (ref 45–160)
TIBC: 303 ug/dL (ref 250–450)
UIBC: 223 ug/dL (ref 125–400)

## 2015-07-19 LAB — MAGNESIUM: Magnesium: 2.1 mg/dL (ref 1.5–2.5)

## 2015-07-19 NOTE — Patient Instructions (Signed)
Diabetes is a very complicated disease...lets simplify it.  An easy way to look at it to understand the complications is if you think of the extra sugar floating in your blood stream as glass shards floating through your blood stream.    Diabetes affects your small vessels first: 1) The glass shards (sugar) scraps down the tiny blood vessels in your eyes and lead to diabetic retinopathy, the leading cause of blindness in the [place]. Diabetes is the leading cause of newly diagnosed adult (20 to 69 years of age) blindness in the [place].  2) The glass shards scratches down the tiny vessels of your legs leading to nerve damage called neuropathy and can lead to amputations of your feet. More than 60% of all non-traumatic amputations of lower limbs occur in people with diabetes.  3) Over time the small vessels in your brain are shredded and closed off, individually this does not cause any problems but over a long period of time many of the small vessels being blocked can lead to Vascular Dementia.   4) Your kidney's are a filter system and have a "net" that keeps certain things in the body and lets bad things out. Sugar shreds this net and leads to kidney damage and eventually failure. Decreasing the sugar that is destroying the net and certain blood pressure medications can help stop or decrease progression of kidney disease. Diabetes was the primary cause of kidney failure in 44 percent of all new cases in 2011.  5) Diabetes also destroys the small vessels in your penis that lead to erectile dysfunction. Eventually the vessels are so damaged that you may not be responsive to cialis or viagra.   Diabetes and your large vessels: Your larger vessels consist of your coronary arteries in your heart and the carotid vessels to your brain. Diabetes or even increased sugars put you at 300% increased risk of heart attack and stroke and this is why.. The sugar scrapes down your large blood vessels and your body  sees this as an internal injury and tries to repair itself. Just like you get a scab on your skin, your platelets will stick to the blood vessel wall trying to heal it. This is why we have diabetics on low dose aspirin daily, this prevents the platelets from sticking and can prevent plaque formation. In addition, your body takes cholesterol and tries to shove it into the open wound. This is why we want your LDL, or bad cholesterol, below 70.   The combination of platelets and cholesterol over 5-10 years forms plaque that can break off and cause a heart attack or stroke.   PLEASE REMEMBER:  Diabetes is preventable! Up to 85 percent of complications and morbidities among individuals with type 2 diabetes can be prevented, delayed, or effectively treated and minimized with regular visits to a health professional, appropriate monitoring and medication, and a healthy diet and lifestyle.     Bad carbs also include fruit juice, alcohol, and sweet tea. These are empty calories that do not signal to your brain that you are full.   Please remember the good carbs are still carbs which convert into sugar. So please measure them out no more than 1/2-1 cup of rice, oatmeal, pasta, and beans  Veggies are however free foods! Pile them on.   Not all fruit is created equal. Please see the list below, the fruit at the bottom is higher in sugars than the fruit at the top. Please avoid all dried fruits.     

## 2015-07-19 NOTE — Progress Notes (Signed)
3 MONTH FOLLOW UP  ASSESSMENT AND PLAN 1. Essential hypertension - continue medications, DASH diet, exercise and monitor at home. Call if greater than 130/80.  - CBC with Differential/Platelet - BASIC METABOLIC PANEL WITH GFR - Hepatic function panel - TSH  2. 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 - Hemoglobin A1c  3. Hyperlipidemia -continue medications, check lipids, decrease fatty foods, increase activity.  - Lipid panel  4. Depression, controlled negative  5. Iron deficiency anemia - Iron and TIBC - Ferritin  6. Vitamin D deficiency  7. Medication management - Magnesium    Over 30 minutes of exam, counseling, chart review, and critical decision making was performed  H & P  Nancy Myers is a 69 y.o. female who presents 3 month follow up on hypertension, prediabetes, hyperlipidemia, vitamin D def.     Her blood pressure has been controlled at home, took her medications and declines any increase/addition of meds, today it is BP: 140/90 mmHg  She does workout, walking. She denies chest pain, shortness of breath, dizziness.  She had a normal echo and myoview stress test 2014.  She is on cholesterol medication and denies myalgias. She is eating more veggies and has cut back on meat, only eating chicken and salmon occ.  Her cholesterol is at goal. The cholesterol last visit was:   Lab Results  Component Value Date   CHOL 205* 03/31/2015   HDL 39* 03/31/2015   LDLCALC 128* 03/31/2015   TRIG 192* 03/31/2015   CHOLHDL 5.3 03/31/2015   She has been working on diet and exercise for Diabetes with p. neuropathy she is on bASA, she is on ARB, only on metformin 2 a day and denies hypoglycemia , polydipsia and polyuria. Last A1C in the office  was:  Lab Results  Component Value Date   HGBA1C 7.4* 03/31/2015   Patient is on Vitamin D supplement. Lab Results  Component Value Date   VD25OH 30 03/31/2015   Had surgery on her left shoulder with Dr. Marlou Sa in August, doing PT, and follows up with him Nov 9th.  BMI is Body mass index is 28.92 kg/(m^2)., she is working on diet and exercise. Wt Readings from Last 3 Encounters:  07/19/15 153 lb (69.4 kg)  04/13/15 156 lb (70.761 kg)  03/31/15 156 lb (70.761 kg)   She is on prozac for depression which is controlled.  She has bronchitis and is on albuterol PRN.   Medication Review Current Outpatient Prescriptions on File Prior to Visit  Medication Sig Dispense Refill  . albuterol (PROVENTIL HFA;VENTOLIN HFA) 108 (90 BASE) MCG/ACT inhaler 2 puffs every 6 hours if needed- rescue inhaler 3 Inhaler 3  . aspirin 81 MG chewable tablet Chew 162 mg by mouth daily.     Marland Kitchen atorvastatin (LIPITOR) 40 MG tablet Take 1 tablet (40 mg total) by mouth daily. 90 tablet 3  . Blood Glucose Monitoring Suppl (FREESTYLE FREEDOM LITE) W/DEVICE KIT Check Blood Sugar One Time Daily 1 each PRN  . budesonide (PULMICORT) 0.5 MG/2ML nebulizer solution USE 1 VIAL VIA NEBULIZER TWICE A DAY 120 mL 2  . carisoprodol (SOMA) 250 MG tablet Take 1 tablet (250 mg total) by mouth every 6 (six) hours as needed. 180 tablet 3  . cetirizine (ZYRTEC) 10 MG tablet Take 10 mg by mouth daily.    . Cholecalciferol (VITAMIN  D PO) Take 5,000 Units by mouth.    . cyanocobalamin (,VITAMIN B-12,) 1000 MCG/ML injection Inject 1 mL (1,000 mcg total) into the muscle every 30 (thirty) days. 10 mL 3  . FLUoxetine (PROZAC) 20 MG capsule Take 1 capsule (20 mg total) by mouth daily. 90 capsule 3  . glucose blood test strip Test Blood Sugar One Time Daily 300 each PRN  . hydrochlorothiazide (HYDRODIURIL) 25 MG tablet Take 1 tablet (25 mg total) by mouth daily. 90 tablet 3  . hydrocortisone (ANUSOL-HC) 25 MG suppository Place 1 suppository (25 mg  total) rectally 2 (two) times daily. 100 suppository 0  . ipratropium-albuterol (DUONEB) 0.5-2.5 (3) MG/3ML SOLN USE 1 VIA VIA NEBULIZER 4 TIMES A DAY 360 mL 2  . Lancets (FREESTYLE) lancets Test Blood Sugar One Time Daily 100 each PRN  . losartan (COZAAR) 100 MG tablet Take 1 tablet (100 mg total) by mouth daily. 90 tablet 3  . Magnesium 500 MG TABS Take 500 mg by mouth daily.     . metFORMIN (GLUCOPHAGE) 500 MG tablet Take 2 tablets (1,000 mg total) by mouth daily with breakfast. 180 tablet 3  . metoprolol (TOPROL-XL) 200 MG 24 hr tablet Take 1 tablet (200 mg total) by mouth daily. 90 tablet 3  . Misc Natural Products (OSTEO BI-FLEX JOINT SHIELD PO) Take by mouth.    Marland Kitchen OVER THE COUNTER MEDICATION OTC slow iron 1 daily    . OVER THE COUNTER MEDICATION OTC fiber laxative daily    . SYRINGE-NEEDLE, DISP, 3 ML 25G X 5/8" 3 ML MISC Use as directed to inject B12 monthly 100 each 0  . vitamin C (ASCORBIC ACID) 500 MG tablet Take 500 mg by mouth daily.     No current facility-administered medications on file prior to visit.    Current Problems (verified) Patient Active Problem List   Diagnosis Date Noted  . Medication management 03/31/2015  . B12 deficiency 12/22/2014  . History of colon cancer, no staging 12/22/2014  . Noncompliance with medication regimen 08/24/2014  . Scintillating scotoma of both eyes 08/16/2013  . Hyperlipidemia   . 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 GERD (gastroesophageal reflux disease)   . IBS (irritable bowel syndrome)   . Vitamin D deficiency   . Anemia 08/08/2009  . Depression, controlled 08/08/2009  . Essential hypertension 08/08/2009  . Asthmatic bronchitis without complication 14/97/0263    Past Surgical History  Procedure Laterality Date  . Bunionectomy Bilateral 02/1987  . Carpal tunnel release Bilateral 09-30-01;10-21-01  . Anterior cruciate ligament repair Right 04-09-04    right knee  . Left knee  07-26-02  . Left foot  05-23-05  .  Bunionectomy Right 03-08-08    right foot  . Cataract extraction w/ intraocular lens  implant, bilateral Bilateral 12-07-09;02-15-10   Family History  Problem Relation Age of Onset  . Heart disease Father     MI  . Stroke Mother   . Rheum arthritis Mother   . Heart attack Brother     half brother  . Heart attack Maternal Uncle     half maternal uncle   Social History  Substance Use Topics  . Smoking status: Former Smoker    Types: Cigarettes    Quit date: 09/23/1986  . Smokeless tobacco: Never Used  . Alcohol Use: No    Review of Systems  Constitutional: Positive for malaise/fatigue. Negative for fever, weight loss and diaphoresis.  HENT: Negative.   Eyes: Negative.   Respiratory: Negative.  Cardiovascular: Negative.   Gastrointestinal: Negative.   Genitourinary: Negative.   Musculoskeletal: Negative.   Skin: Negative.   Neurological: Positive for tingling and sensory change. Negative for dizziness, tremors, speech change, focal weakness, seizures, loss of consciousness and weakness.  Psychiatric/Behavioral: Negative.     Today's Vitals   07/19/15 0939  BP: 140/90  Pulse: 101  Temp: 97 F (36.1 C)  TempSrc: Temporal  Resp: 14  Height: _0  (1.549 m)  Weight: 153 lb (69.4 kg)  SpO2: 95%   Body mass index is 28.92 kg/(m^2).  General appearance: alert, no distress, WD/WN,  female HEENT: normocephalic, sclerae anicteric, TMs pearly, nares patent, no discharge or erythema, pharynx normal Oral cavity: MMM, no lesions Neck: supple, no lymphadenopathy, no thyromegaly, no masses Heart: RRR, normal S1, S2, no murmurs Lungs: CTA bilaterally, no wheezes, rhonchi, or rales Abdomen: +bs, soft, non tender, non distended, no masses, no hepatomegaly, no splenomegaly Musculoskeletal: nontender, no swelling, no obvious deformity Extremities: no edema, no cyanosis, no clubbing Pulses: 2+ symmetric, upper and lower extremities, normal cap refill Neurological: alert, oriented x  3, CN2-12 intact, strength normal upper extremities and lower extremities, sensation normal throughout, DTRs 2+ throughout, no cerebellar signs, gait normal Psychiatric: normal affect, behavior normal, pleasant   Vicie Mutters, PA-C

## 2015-07-20 LAB — HEMOGLOBIN A1C
HEMOGLOBIN A1C: 6.8 % — AB (ref <5.7)
MEAN PLASMA GLUCOSE: 148 mg/dL — AB (ref <117)

## 2015-07-20 LAB — FERRITIN: Ferritin: 58 ng/mL (ref 10–291)

## 2015-07-20 LAB — TSH: TSH: 1.128 u[IU]/mL (ref 0.350–4.500)

## 2015-07-25 DIAGNOSIS — M25512 Pain in left shoulder: Secondary | ICD-10-CM | POA: Diagnosis not present

## 2015-07-25 DIAGNOSIS — M6281 Muscle weakness (generalized): Secondary | ICD-10-CM | POA: Diagnosis not present

## 2015-07-25 DIAGNOSIS — M75102 Unspecified rotator cuff tear or rupture of left shoulder, not specified as traumatic: Secondary | ICD-10-CM | POA: Diagnosis not present

## 2015-07-25 DIAGNOSIS — S43422D Sprain of left rotator cuff capsule, subsequent encounter: Secondary | ICD-10-CM | POA: Diagnosis not present

## 2015-07-27 DIAGNOSIS — M6281 Muscle weakness (generalized): Secondary | ICD-10-CM | POA: Diagnosis not present

## 2015-07-27 DIAGNOSIS — S43422D Sprain of left rotator cuff capsule, subsequent encounter: Secondary | ICD-10-CM | POA: Diagnosis not present

## 2015-07-27 DIAGNOSIS — M75102 Unspecified rotator cuff tear or rupture of left shoulder, not specified as traumatic: Secondary | ICD-10-CM | POA: Diagnosis not present

## 2015-07-27 DIAGNOSIS — M25512 Pain in left shoulder: Secondary | ICD-10-CM | POA: Diagnosis not present

## 2015-08-01 DIAGNOSIS — S43422D Sprain of left rotator cuff capsule, subsequent encounter: Secondary | ICD-10-CM | POA: Diagnosis not present

## 2015-08-01 DIAGNOSIS — M75102 Unspecified rotator cuff tear or rupture of left shoulder, not specified as traumatic: Secondary | ICD-10-CM | POA: Diagnosis not present

## 2015-08-01 DIAGNOSIS — M25512 Pain in left shoulder: Secondary | ICD-10-CM | POA: Diagnosis not present

## 2015-08-01 DIAGNOSIS — M6281 Muscle weakness (generalized): Secondary | ICD-10-CM | POA: Diagnosis not present

## 2015-08-03 DIAGNOSIS — M25512 Pain in left shoulder: Secondary | ICD-10-CM | POA: Diagnosis not present

## 2015-08-03 DIAGNOSIS — M6281 Muscle weakness (generalized): Secondary | ICD-10-CM | POA: Diagnosis not present

## 2015-08-03 DIAGNOSIS — M75102 Unspecified rotator cuff tear or rupture of left shoulder, not specified as traumatic: Secondary | ICD-10-CM | POA: Diagnosis not present

## 2015-08-03 DIAGNOSIS — S43422D Sprain of left rotator cuff capsule, subsequent encounter: Secondary | ICD-10-CM | POA: Diagnosis not present

## 2015-08-08 DIAGNOSIS — S43422D Sprain of left rotator cuff capsule, subsequent encounter: Secondary | ICD-10-CM | POA: Diagnosis not present

## 2015-08-08 DIAGNOSIS — M25512 Pain in left shoulder: Secondary | ICD-10-CM | POA: Diagnosis not present

## 2015-08-08 DIAGNOSIS — M6281 Muscle weakness (generalized): Secondary | ICD-10-CM | POA: Diagnosis not present

## 2015-08-08 DIAGNOSIS — M75102 Unspecified rotator cuff tear or rupture of left shoulder, not specified as traumatic: Secondary | ICD-10-CM | POA: Diagnosis not present

## 2015-08-22 DIAGNOSIS — M75102 Unspecified rotator cuff tear or rupture of left shoulder, not specified as traumatic: Secondary | ICD-10-CM | POA: Diagnosis not present

## 2015-08-22 DIAGNOSIS — M25512 Pain in left shoulder: Secondary | ICD-10-CM | POA: Diagnosis not present

## 2015-08-22 DIAGNOSIS — M6281 Muscle weakness (generalized): Secondary | ICD-10-CM | POA: Diagnosis not present

## 2015-08-22 DIAGNOSIS — S43422D Sprain of left rotator cuff capsule, subsequent encounter: Secondary | ICD-10-CM | POA: Diagnosis not present

## 2015-09-08 DIAGNOSIS — M6281 Muscle weakness (generalized): Secondary | ICD-10-CM | POA: Diagnosis not present

## 2015-09-08 DIAGNOSIS — M75102 Unspecified rotator cuff tear or rupture of left shoulder, not specified as traumatic: Secondary | ICD-10-CM | POA: Diagnosis not present

## 2015-09-08 DIAGNOSIS — S43422D Sprain of left rotator cuff capsule, subsequent encounter: Secondary | ICD-10-CM | POA: Diagnosis not present

## 2015-09-08 DIAGNOSIS — M25512 Pain in left shoulder: Secondary | ICD-10-CM | POA: Diagnosis not present

## 2015-09-12 DIAGNOSIS — S43422D Sprain of left rotator cuff capsule, subsequent encounter: Secondary | ICD-10-CM | POA: Diagnosis not present

## 2015-09-12 DIAGNOSIS — M25512 Pain in left shoulder: Secondary | ICD-10-CM | POA: Diagnosis not present

## 2015-09-12 DIAGNOSIS — M75102 Unspecified rotator cuff tear or rupture of left shoulder, not specified as traumatic: Secondary | ICD-10-CM | POA: Diagnosis not present

## 2015-09-12 DIAGNOSIS — M6281 Muscle weakness (generalized): Secondary | ICD-10-CM | POA: Diagnosis not present

## 2015-09-14 DIAGNOSIS — Z961 Presence of intraocular lens: Secondary | ICD-10-CM | POA: Diagnosis not present

## 2015-09-14 DIAGNOSIS — Z0100 Encounter for examination of eyes and vision without abnormal findings: Secondary | ICD-10-CM | POA: Diagnosis not present

## 2015-09-14 DIAGNOSIS — H26493 Other secondary cataract, bilateral: Secondary | ICD-10-CM | POA: Diagnosis not present

## 2015-09-14 DIAGNOSIS — E119 Type 2 diabetes mellitus without complications: Secondary | ICD-10-CM | POA: Diagnosis not present

## 2015-09-22 ENCOUNTER — Encounter: Payer: Self-pay | Admitting: *Deleted

## 2015-09-27 ENCOUNTER — Encounter: Payer: Self-pay | Admitting: Physician Assistant

## 2015-10-19 ENCOUNTER — Encounter: Payer: Self-pay | Admitting: Physician Assistant

## 2015-10-19 ENCOUNTER — Ambulatory Visit (INDEPENDENT_AMBULATORY_CARE_PROVIDER_SITE_OTHER): Payer: Medicare Other | Admitting: Physician Assistant

## 2015-10-19 ENCOUNTER — Other Ambulatory Visit (HOSPITAL_COMMUNITY)
Admission: RE | Admit: 2015-10-19 | Discharge: 2015-10-19 | Disposition: A | Discharge status: Home / Self Care | Payer: Medicare Other | Source: Ambulatory Visit | Attending: Physician Assistant | Admitting: Physician Assistant

## 2015-10-19 VITALS — BP 160/80 | HR 98 | Temp 97.5°F | Resp 16 | Ht 61.0 in | Wt 148.8 lb

## 2015-10-19 DIAGNOSIS — E785 Hyperlipidemia, unspecified: Secondary | ICD-10-CM | POA: Diagnosis not present

## 2015-10-19 DIAGNOSIS — E538 Deficiency of other specified B group vitamins: Secondary | ICD-10-CM | POA: Diagnosis not present

## 2015-10-19 DIAGNOSIS — E1149 Type 2 diabetes mellitus with other diabetic neurological complication: Secondary | ICD-10-CM | POA: Diagnosis not present

## 2015-10-19 DIAGNOSIS — Z9114 Patient's other noncompliance with medication regimen: Secondary | ICD-10-CM | POA: Diagnosis not present

## 2015-10-19 DIAGNOSIS — F329 Major depressive disorder, single episode, unspecified: Secondary | ICD-10-CM

## 2015-10-19 DIAGNOSIS — E559 Vitamin D deficiency, unspecified: Secondary | ICD-10-CM

## 2015-10-19 DIAGNOSIS — K2100 Gastro-esophageal reflux disease with esophagitis, without bleeding: Secondary | ICD-10-CM

## 2015-10-19 DIAGNOSIS — F32A Depression, unspecified: Secondary | ICD-10-CM

## 2015-10-19 DIAGNOSIS — Z1151 Encounter for screening for human papillomavirus (HPV): Secondary | ICD-10-CM | POA: Diagnosis not present

## 2015-10-19 DIAGNOSIS — Z85038 Personal history of other malignant neoplasm of large intestine: Secondary | ICD-10-CM | POA: Diagnosis not present

## 2015-10-19 DIAGNOSIS — Z01411 Encounter for gynecological examination (general) (routine) with abnormal findings: Secondary | ICD-10-CM | POA: Insufficient documentation

## 2015-10-19 DIAGNOSIS — D509 Iron deficiency anemia, unspecified: Secondary | ICD-10-CM

## 2015-10-19 DIAGNOSIS — I10 Essential (primary) hypertension: Secondary | ICD-10-CM

## 2015-10-19 DIAGNOSIS — K589 Irritable bowel syndrome without diarrhea: Secondary | ICD-10-CM

## 2015-10-19 DIAGNOSIS — Z79899 Other long term (current) drug therapy: Secondary | ICD-10-CM

## 2015-10-19 DIAGNOSIS — H5319 Other subjective visual disturbances: Secondary | ICD-10-CM

## 2015-10-19 DIAGNOSIS — J45909 Unspecified asthma, uncomplicated: Secondary | ICD-10-CM

## 2015-10-19 DIAGNOSIS — H53123 Transient visual loss, bilateral: Secondary | ICD-10-CM

## 2015-10-19 DIAGNOSIS — N950 Postmenopausal bleeding: Secondary | ICD-10-CM

## 2015-10-19 LAB — BASIC METABOLIC PANEL WITH GFR
BUN: 8 mg/dL (ref 7–25)
CO2: 31 mmol/L (ref 20–31)
CREATININE: 0.73 mg/dL (ref 0.50–0.99)
Calcium: 9.4 mg/dL (ref 8.6–10.4)
Chloride: 101 mmol/L (ref 98–110)
GFR, Est African American: >89 mL/min (ref >=60)
GFR, Est Non African American: 84 mL/min (ref >=60)
Glucose, Bld: 108 mg/dL — ABNORMAL HIGH (ref 65–99)
POTASSIUM: 4.4 mmol/L (ref 3.5–5.3)
Sodium: 139 mmol/L (ref 135–146)

## 2015-10-19 LAB — URINALYSIS, MICROSCOPIC ONLY
Bacteria, UA: NONE SEEN [HPF]
CASTS: NONE SEEN [LPF]
Crystals: NONE SEEN [HPF]
RBC / HPF: NONE SEEN RBC/HPF (ref <=2)
Squamous Epithelial / LPF: NONE SEEN [HPF] (ref <=5)
WBC, UA: NONE SEEN WBC/HPF (ref <=5)
YEAST: NONE SEEN [HPF]

## 2015-10-19 LAB — URINALYSIS, ROUTINE W REFLEX MICROSCOPIC
Bilirubin Urine: NEGATIVE
Glucose, UA: NEGATIVE
Hgb urine dipstick: NEGATIVE
Ketones, ur: NEGATIVE
NITRITE: NEGATIVE
PH: 7 (ref 5.0–8.0)
Protein, ur: NEGATIVE
SPECIFIC GRAVITY, URINE: 1.01 (ref 1.001–1.035)

## 2015-10-19 LAB — LIPID PANEL
CHOL/HDL RATIO: 5.7 ratio — AB (ref <=5.0)
Cholesterol: 229 mg/dL — ABNORMAL HIGH (ref 125–200)
HDL: 40 mg/dL — AB (ref >=46)
LDL Cholesterol: 159 mg/dL — ABNORMAL HIGH (ref <130)
TRIGLYCERIDES: 152 mg/dL — AB (ref <150)
VLDL: 30 mg/dL (ref <30)

## 2015-10-19 LAB — CBC WITH DIFFERENTIAL/PLATELET
BASOS ABS: 0 10*3/uL (ref 0.0–0.1)
Basophils Relative: 0 % (ref 0–1)
Eosinophils Absolute: 0.1 10*3/uL (ref 0.0–0.7)
Eosinophils Relative: 2 % (ref 0–5)
HEMATOCRIT: 38.9 % (ref 36.0–46.0)
HEMOGLOBIN: 12.5 g/dL (ref 12.0–15.0)
Lymphocytes Relative: 46 % (ref 12–46)
Lymphs Abs: 2 10*3/uL (ref 0.7–4.0)
MCH: 26.7 pg (ref 26.0–34.0)
MCHC: 32.1 g/dL (ref 30.0–36.0)
MCV: 82.9 fL (ref 78.0–100.0)
MONO ABS: 0.4 10*3/uL (ref 0.1–1.0)
MPV: 9.9 fL (ref 8.6–12.4)
Monocytes Relative: 8 % (ref 3–12)
Neutro Abs: 1.9 10*3/uL (ref 1.7–7.7)
Neutrophils Relative %: 44 % (ref 43–77)
Platelets: 287 10*3/uL (ref 150–400)
RBC: 4.69 MIL/uL (ref 3.87–5.11)
RDW: 14.3 % (ref 11.5–15.5)
WBC: 4.4 10*3/uL (ref 4.0–10.5)

## 2015-10-19 LAB — HEPATIC FUNCTION PANEL
ALBUMIN: 4.1 g/dL (ref 3.6–5.1)
ALK PHOS: 103 U/L (ref 33–130)
ALT: 9 U/L (ref 6–29)
AST: 17 U/L (ref 10–35)
BILIRUBIN TOTAL: 0.4 mg/dL (ref 0.2–1.2)
Bilirubin, Direct: 0.1 mg/dL (ref <=0.2)
Indirect Bilirubin: 0.3 mg/dL (ref 0.2–1.2)
Total Protein: 7.4 g/dL (ref 6.1–8.1)

## 2015-10-19 LAB — IRON AND TIBC
%SAT: 13 % (ref 11–50)
IRON: 40 ug/dL — AB (ref 45–160)
TIBC: 310 ug/dL (ref 250–450)
UIBC: 270 ug/dL (ref 125–400)

## 2015-10-19 LAB — HEMOGLOBIN A1C
Hgb A1c MFr Bld: 7.2 % — ABNORMAL HIGH (ref <5.7)
Mean Plasma Glucose: 160 mg/dL — ABNORMAL HIGH (ref <117)

## 2015-10-19 LAB — FERRITIN: Ferritin: 52 ng/mL (ref 10–291)

## 2015-10-19 LAB — TSH: TSH: 1.325 u[IU]/mL (ref 0.350–4.500)

## 2015-10-19 LAB — MAGNESIUM: Magnesium: 2 mg/dL (ref 1.5–2.5)

## 2015-10-19 MED ORDER — ACYCLOVIR 400 MG PO TABS: ORAL_TABLET | ORAL | Status: DC

## 2015-10-19 MED ORDER — ACYCLOVIR 400 MG PO TABS: ORAL_TABLET | ORAL | Status: AC

## 2015-10-19 NOTE — Patient Instructions (Addendum)
Gt back on bASA 2 x a day Get back on lipitor Stay on B12 and vitamin D Stay on cozaar for kidney and heart protection Get back on metformin  Diabetes is a very complicated disease...lets simplify it.  An easy way to look at it to understand the complications is if you think of the extra sugar floating in your blood stream as glass shards floating through your blood stream.    Diabetes affects your small vessels first: 1) The glass shards (sugar) scraps down the tiny blood vessels in your eyes and lead to diabetic retinopathy, the leading cause of blindness in the [place]. Diabetes is the leading cause of newly diagnosed adult (30 to 70 years of age) blindness in the [place].  2) The glass shards scratches down the tiny vessels of your legs leading to nerve damage called neuropathy and can lead to amputations of your feet. More than 60% of all non-traumatic amputations of lower limbs occur in people with diabetes.  3) Over time the small vessels in your brain are shredded and closed off, individually this does not cause any problems but over a long period of time many of the small vessels being blocked can lead to Vascular Dementia.   4) Your kidney's are a filter system and have a "net" that keeps certain things in the body and lets bad things out. Sugar shreds this net and leads to kidney damage and eventually failure. Decreasing the sugar that is destroying the net and certain blood pressure medications can help stop or decrease progression of kidney disease. Diabetes was the primary cause of kidney failure in 44 percent of all new cases in 2011.  5) Diabetes also destroys the small vessels in your penis that lead to erectile dysfunction. Eventually the vessels are so damaged that you may not be responsive to cialis or viagra.   Diabetes and your large vessels: Your larger vessels consist of your coronary arteries in your heart and the carotid vessels to your brain. Diabetes or even  increased sugars put you at 300% increased risk of heart attack and stroke and this is why.. The sugar scrapes down your large blood vessels and your body sees this as an internal injury and tries to repair itself. Just like you get a scab on your skin, your platelets will stick to the blood vessel wall trying to heal it. This is why we have diabetics on low dose aspirin daily, this prevents the platelets from sticking and can prevent plaque formation. In addition, your body takes cholesterol and tries to shove it into the open wound. This is why we want your LDL, or bad cholesterol, below 70.   The combination of platelets and cholesterol over 5-10 years forms plaque that can break off and cause a heart attack or stroke.   PLEASE REMEMBER:  Diabetes is preventable! Up to 19 percent of complications and morbidities among individuals with type 2 diabetes can be prevented, delayed, or effectively treated and minimized with regular visits to a health professional, appropriate monitoring and medication, and a healthy diet and lifestyle.      Bad carbs also include fruit juice, alcohol, and sweet tea. These are empty calories that do not signal to your brain that you are full.   Please remember the good carbs are still carbs which convert into sugar. So please measure them out no more than 1/2-1 cup of rice, oatmeal, pasta, and beans  Veggies are however free foods! Pile them on.  Not all fruit is created equal. Please see the list below, the fruit at the bottom is higher in sugars than the fruit at the top. Please avoid all dried fruits.    Vascular Dementia Vascular dementia is a common cause of dementia in the elderly. Dementia is a condition that affects the brain and causes people to not think well or act normally. Vascular dementia is one type of dementia. It occurs when blood clots block small blood vessels in the brain and destroy brain tissue. Likely risk factors are high blood pressure and  advanced age. This disease can cause stroke, migraine-like headaches, and psychiatric disturbances.  SYMPTOMS  5. Confusion. 6. Problems with recent memory. 7. Wandering or getting lost in familiar places. 8. Loss of bladder or bowel control (incontinence). 9. Unsteady gait. 10. Poor attention and concentration. 11. Emotional problems such as laughing or crying inappropriately. 12. Difficulty following instructions. 13. Problems handling money. 14. Depression. 15. Difficulty planning ahead. Usually the damage is slight at first. Over time, as more small vessels are blocked, there is a gradual mental decline. However, symptoms may begin suddenly. Symptoms may be very similar to Alzheimer's disease. The two forms of dementia may occur together. Vascular dementia typically begins between the ages of 28 and 14. It affects men more often than women. TREATMENT   Currently there is no treatment for vascular dementia that can reverse the damage that has already occurred.  Treatment focuses on prevention of additional brain damage and improvement of symptoms.  It is important to treat the risk factors for vascular dementia, such as keeping blood pressure under control, treating diabetes, lowering cholesterol, and stop smoking. PROGNOSIS   Prognosis for patients is generally poor. Individuals with the disease may improve for short periods of time, then get worse again. Early treatment and management of blood pressure and other risk factors may prevent further worsening of the disorder.   This information is not intended to replace advice given to you by your health care provider. Make sure you discuss any questions you have with your health care provider.   Document Released: 08/30/2002 Document Revised: 12/02/2011 Document Reviewed: 12/21/2014 Elsevier Interactive Patient Education [employer].

## 2015-10-19 NOTE — Progress Notes (Signed)
Complete Physical  Assessment and Plan: 1. Essential hypertension - GET BACK ON MEDICATIONS, DASH diet, exercise and monitor at home. Call if greater than 130/80.  - CBC with Differential - BASIC METABOLIC PANEL WITH GFR - Hepatic function panel - TSH - Urinalysis, Routine w reflex microscopic - Microalbumin / creatinine urine ratio  2. Diabetes mellitus type 2 with neurological manifestations Discussed general issues about diabetes pathophysiology and management., Educational material distributed., Suggested low cholesterol diet., Encouraged aerobic exercise., Discussed foot care., Reminded to get yearly retinal exam. - Hemoglobin A1c - Insulin, fasting - HM DIABETES FOOT EXAM  3. PARESTHESIA From DM Control sugars Feet checks  4. Hyperlipidemia -continue medications, check lipids, decrease fatty foods, increase activity.  - Lipid panel  5. Vitamin D deficiency - Vit D  25 hydroxy (rtn osteoporosis monitoring)  6. Noncompliance with medication regimen Discussed the important of compliance to decrease the risk of MI, stroke, and death  2022/11/24. Depression Depression- continue medications, stress management techniques discussed, increase water, good sleep hygiene discussed, increase exercise, and increase veggies.   8. B12 deficiency - Vitamin B12  9. Medication management - Magnesium  10. Post-menopausal bleeding Has been on prempro in the past, will send off for PAP, + firm uterus/fundus will get vaginal [place] - Cytology - PAP - US Pelvis Complete; Future - US Transvaginal Non-OB; Future  11. Family history of colon cancer Follow up GI doctor   Discussed med's effects and SE's. Screening labs and tests as requested with regular follow-up as recommended.  HPI  70 y.o. female  presents for a complete physical.   Her blood pressure has been controlled at home, today their BP is BP: (!) 160/80 mmHg (HAS NOT TAKING HER MEDS) She does workout. She denies chest pain,  shortness of breath, dizziness.  She follows with Dr. Wynonia Lawman for history of chest pain and shortness of breath, she is on bASA and has taken all of her medications today and her BP is good.  She is on cholesterol medication, lipitor 7m and denies myalgias. Her cholesterol is not at goal. The cholesterol last visit was:   Lab Results  Component Value Date   CHOL 168 07/19/2015   HDL 37* 07/19/2015   LDLCALC 94 07/19/2015   TRIG 184* 07/19/2015   CHOLHDL 4.5 07/19/2015    She has been working on diet and exercise for diabetes, she is on bASA, she is on ACE/ARB she is on metformin, and denies polydipsia, polyuria and visual disturbances. Last A1C in the office was:  Lab Results  Component Value Date   HGBA1C 6.8* 07/19/2015  . Patient is on Vitamin D supplement.   She has been under a lot of stress.  She is on prozac and would like to go down to one a day.  Has been having some blood on her TP with wiping after urination.  Has been off prempro.  BMI is Body mass index is 28.13 kg/(m^2)., she is working on diet and exercise. Wt Readings from Last 3 Encounters:  10/19/15 148 lb 12.8 oz (67.495 kg)  07/19/15 153 lb (69.4 kg)  04/13/15 156 lb (70.761 kg)      Current Medications:  Current Outpatient Prescriptions on File Prior to Visit  Medication Sig Dispense Refill  . albuterol (PROVENTIL HFA;VENTOLIN HFA) 108 (90 BASE) MCG/ACT inhaler 2 puffs every 6 hours if needed- rescue inhaler 3 Inhaler 3  . aspirin 81 MG chewable tablet Chew 162 mg by mouth daily.     .Marland Kitchen  atorvastatin (LIPITOR) 40 MG tablet Take 1 tablet (40 mg total) by mouth daily. 90 tablet 3  . Blood Glucose Monitoring Suppl (FREESTYLE FREEDOM LITE) W/DEVICE KIT Check Blood Sugar One Time Daily 1 each PRN  . budesonide (PULMICORT) 0.5 MG/2ML nebulizer solution USE 1 VIAL VIA NEBULIZER TWICE A DAY 120 mL 2  . carisoprodol (SOMA) 250 MG tablet Take 1 tablet (250 mg total) by mouth every 6 (six) hours as needed. 180 tablet 3   . cetirizine (ZYRTEC) 10 MG tablet Take 10 mg by mouth daily.    . Cholecalciferol (VITAMIN D PO) Take 5,000 Units by mouth.    . cyanocobalamin (,VITAMIN B-12,) 1000 MCG/ML injection Inject 1 mL (1,000 mcg total) into the muscle every 30 (thirty) days. 10 mL 3  . FLUoxetine (PROZAC) 20 MG capsule Take 1 capsule (20 mg total) by mouth daily. 90 capsule 3  . glucose blood test strip Test Blood Sugar One Time Daily 300 each PRN  . hydrochlorothiazide (HYDRODIURIL) 25 MG tablet Take 1 tablet (25 mg total) by mouth daily. 90 tablet 3  . hydrocortisone (ANUSOL-HC) 25 MG suppository Place 1 suppository (25 mg total) rectally 2 (two) times daily. 100 suppository 0  . ipratropium-albuterol (DUONEB) 0.5-2.5 (3) MG/3ML SOLN USE 1 VIA VIA NEBULIZER 4 TIMES A DAY 360 mL 2  . Lancets (FREESTYLE) lancets Test Blood Sugar One Time Daily 100 each PRN  . losartan (COZAAR) 100 MG tablet Take 1 tablet (100 mg total) by mouth daily. 90 tablet 3  . Magnesium 500 MG TABS Take 500 mg by mouth daily.     . metFORMIN (GLUCOPHAGE) 500 MG tablet Take 2 tablets (1,000 mg total) by mouth daily with breakfast. 180 tablet 3  . metoprolol (TOPROL-XL) 200 MG 24 hr tablet Take 1 tablet (200 mg total) by mouth daily. 90 tablet 3  . Misc Natural Products (OSTEO BI-FLEX JOINT SHIELD PO) Take by mouth.    Marland Kitchen OVER THE COUNTER MEDICATION OTC slow iron 1 daily    . OVER THE COUNTER MEDICATION OTC fiber laxative daily    . SYRINGE-NEEDLE, DISP, 3 ML 25G X 5/8" 3 ML MISC Use as directed to inject B12 monthly 100 each 0  . vitamin C (ASCORBIC ACID) 500 MG tablet Take 500 mg by mouth daily.     No current facility-administered medications on file prior to visit.   Health Maintenance:   Immunization History  Administered Date(s) Administered  . Influenza Split 06/23/2013  . Influenza, High Dose Seasonal PF 08/01/2014, 07/19/2015  . Pneumococcal Conjugate-13 09/21/2014  . Pneumococcal-Unspecified 09/03/2012  . Td 10/03/2010    Tetanus: 2012  Pneumovax:2013  prevnar 2015 Flu vaccine: 2016 Zostavax: Needs to check with insurance Pap: 2010  MGM: 06/2014 DUE DEXA: N/A  Colonoscopy: 2013 Due 5 years Dr. Oletta Lamas due next year EGD: N/A Eye: Dr. Claudean Kinds Dec 2015  Patient Care Team: Unk Pinto, MD as PCP - General (Internal Medicine) Laurence Spates, MD as Consulting Physician (Gastroenterology) Pixie Casino, MD as Consulting Physician (Cardiology) Deneise Lever, MD as Consulting Physician (Pulmonary Disease)  Allergies:  Allergies  Allergen Reactions  . Codeine Itching   Medical History:  Past Medical History  Diagnosis Date  . Hypertension   . Hyperlipidemia   . Diabetes mellitus without complication ([place])   . GERD (gastroesophageal reflux disease)   . Anxiety   . Depression   . Anemia   . IBS (irritable bowel syndrome)   . Vitamin D deficiency   .  Cancer [facility]) 1995    Colon Cancer  . Arrhythmia    Surgical History:  Past Surgical History  Procedure Laterality Date  . Bunionectomy Bilateral 02/1987  . Carpal tunnel release Bilateral 09-30-01;10-21-01  . Anterior cruciate ligament repair Right 04-09-04    right knee  . Left knee  07-26-02  . Left foot  05-23-05  . Bunionectomy Right 03-08-08    right foot  . Cataract extraction w/ intraocular lens  implant, bilateral Bilateral 12-07-09;02-15-10   Family History:  Family History  Problem Relation Age of Onset  . Heart disease Father     MI  . Stroke Mother   . Rheum arthritis Mother   . Heart attack Brother     half brother  . Heart attack Maternal Uncle     half maternal uncle   Social History:  Social History  Substance Use Topics  . Smoking status: Former Smoker    Types: Cigarettes    Quit date: 09/23/1986  . Smokeless tobacco: Never Used  . Alcohol Use: No   Review of Systems: Review of Systems  Constitutional: Positive for malaise/fatigue. Negative for fever, weight loss and diaphoresis.  HENT: Negative.    Eyes: Negative.   Respiratory: Negative.   Cardiovascular: Negative.   Gastrointestinal: Negative.   Genitourinary: Negative.        Vaginal bleeding  Musculoskeletal: Negative.   Skin: Negative.   Neurological: Positive for tingling and sensory change. Negative for dizziness, tremors, speech change, focal weakness, seizures, loss of consciousness and weakness.  Psychiatric/Behavioral: Negative.     Physical Exam: Estimated body mass index is 28.13 kg/(m^2) as calculated from the following:   Height as of this encounter: 5' 1"  (1.549 m).   Weight as of this encounter: 148 lb 12.8 oz (67.495 kg). BP 160/80 mmHg  Pulse 98  Temp(Src) 97.5 F (36.4 C) (Temporal)  Resp 16  Ht 5' 1"  (1.549 m)  Wt 148 lb 12.8 oz (67.495 kg)  BMI 28.13 kg/m2  SpO2 98% General Appearance: Well nourished, in no apparent distress.  Eyes: PERRLA, EOMs, conjunctiva no swelling or erythema.   Sinuses: No Frontal/maxillary tenderness  ENT/Mouth: Ext aud canals clear, normal light reflex with TMs without erythema, bulging. Good dentition. No erythema, swelling, or exudate on post pharynx. Tonsils not swollen or erythematous. Hearing normal.  Neck: Supple, thyroid normal. No bruits  Respiratory: Respiratory effort normal, BS equal bilaterally without rales, rhonchi, wheezing or stridor.  Cardio: RRR without murmurs, rubs or gallops. Brisk peripheral pulses without edema.  Chest: symmetric, with normal excursions and percussion.  Breasts: Symmetric, without lumps, nipple discharge, retractions.  Abdomen: Soft, nontender, no guarding, rebound, hernias, masses, or organomegaly. .  Lymphatics: Non tender without lymphadenopathy.  Genitourinary: Some vaginal atrophy, no ulcers/masses/lesions, normal appearing cervix without discharge, PAP sent off. Internal exam with firm uterus, non tender to palpation.  Rectal Exam: + internal hemorrhoid with neg hemoccult Musculoskeletal: Full ROM all peripheral extremities,5/5  strength, and normal gait.  Skin: Warm, dry without rashes, lesions, ecchymosis. Neuro: Cranial nerves intact, reflexes equal bilaterally. Normal muscle tone, no cerebellar symptoms. Sensation decreased bilateral feet.  Psych: Awake and oriented X 3, normal affect, Insight and Judgment appropriate.   EKG: IRBBB, WNL  Vicie Mutters 10:10 AM [facility]

## 2015-10-20 ENCOUNTER — Ambulatory Visit
Admission: RE | Admit: 2015-10-20 | Discharge: 2015-10-20 | Disposition: A | Discharge status: Home / Self Care | Payer: Medicare Other | Source: Ambulatory Visit | Attending: Physician Assistant | Admitting: Physician Assistant

## 2015-10-20 DIAGNOSIS — N950 Postmenopausal bleeding: Secondary | ICD-10-CM

## 2015-10-20 DIAGNOSIS — D259 Leiomyoma of uterus, unspecified: Secondary | ICD-10-CM | POA: Diagnosis not present

## 2015-10-20 LAB — MICROALBUMIN / CREATININE URINE RATIO
CREATININE, URINE: 42 mg/dL (ref 20–320)
MICROALB UR: 1.6 mg/dL
MICROALB/CREAT RATIO: 38 ug/mg{creat} — AB (ref <30)

## 2015-10-23 ENCOUNTER — Other Ambulatory Visit: Payer: Self-pay | Admitting: Physician Assistant

## 2015-10-23 DIAGNOSIS — N950 Postmenopausal bleeding: Secondary | ICD-10-CM

## 2015-10-23 LAB — CYTOLOGY - PAP

## 2015-10-26 ENCOUNTER — Other Ambulatory Visit: Payer: Self-pay | Admitting: Physician Assistant

## 2015-10-31 ENCOUNTER — Encounter: Payer: Self-pay | Admitting: Internal Medicine

## 2015-11-11 ENCOUNTER — Encounter: Payer: Self-pay | Admitting: *Deleted

## 2015-11-29 ENCOUNTER — Other Ambulatory Visit: Payer: Self-pay

## 2015-11-29 DIAGNOSIS — Z1231 Encounter for screening mammogram for malignant neoplasm of breast: Secondary | ICD-10-CM

## 2015-12-04 ENCOUNTER — Encounter: Payer: Self-pay | Admitting: *Deleted

## 2015-12-13 ENCOUNTER — Ambulatory Visit
Admission: RE | Admit: 2015-12-13 | Discharge: 2015-12-13 | Disposition: A | Discharge status: Home / Self Care | Payer: Medicare Other | Source: Ambulatory Visit

## 2015-12-13 DIAGNOSIS — Z1231 Encounter for screening mammogram for malignant neoplasm of breast: Secondary | ICD-10-CM

## 2015-12-20 ENCOUNTER — Ambulatory Visit: Payer: Medicare Other

## 2016-02-13 ENCOUNTER — Other Ambulatory Visit: Payer: Self-pay | Admitting: Physician Assistant

## 2016-02-13 MED ORDER — CARISOPRODOL 250 MG PO TABS: 250.0000 mg | ORAL_TABLET | Freq: Four times a day (QID) | ORAL | Status: DC | PRN

## 2016-03-08 ENCOUNTER — Encounter: Payer: Self-pay | Admitting: Physician Assistant

## 2016-03-08 ENCOUNTER — Ambulatory Visit (INDEPENDENT_AMBULATORY_CARE_PROVIDER_SITE_OTHER): Payer: Medicare Other | Admitting: Physician Assistant

## 2016-03-08 VITALS — BP 140/90 | HR 77 | Temp 97.3°F | Resp 16 | Ht 61.0 in | Wt 147.2 lb

## 2016-03-08 DIAGNOSIS — Z9114 Patient's other noncompliance with medication regimen: Secondary | ICD-10-CM | POA: Diagnosis not present

## 2016-03-08 DIAGNOSIS — E559 Vitamin D deficiency, unspecified: Secondary | ICD-10-CM

## 2016-03-08 DIAGNOSIS — E538 Deficiency of other specified B group vitamins: Secondary | ICD-10-CM

## 2016-03-08 DIAGNOSIS — E1149 Type 2 diabetes mellitus with other diabetic neurological complication: Secondary | ICD-10-CM

## 2016-03-08 DIAGNOSIS — E785 Hyperlipidemia, unspecified: Secondary | ICD-10-CM

## 2016-03-08 DIAGNOSIS — Z79899 Other long term (current) drug therapy: Secondary | ICD-10-CM | POA: Diagnosis not present

## 2016-03-08 DIAGNOSIS — R6889 Other general symptoms and signs: Secondary | ICD-10-CM

## 2016-03-08 DIAGNOSIS — Z91148 Patient's other noncompliance with medication regimen for other reason: Secondary | ICD-10-CM

## 2016-03-08 DIAGNOSIS — F32A Depression, unspecified: Secondary | ICD-10-CM

## 2016-03-08 DIAGNOSIS — I10 Essential (primary) hypertension: Secondary | ICD-10-CM

## 2016-03-08 DIAGNOSIS — D509 Iron deficiency anemia, unspecified: Secondary | ICD-10-CM

## 2016-03-08 DIAGNOSIS — Z85038 Personal history of other malignant neoplasm of large intestine: Secondary | ICD-10-CM

## 2016-03-08 DIAGNOSIS — K2100 Gastro-esophageal reflux disease with esophagitis, without bleeding: Secondary | ICD-10-CM

## 2016-03-08 DIAGNOSIS — K589 Irritable bowel syndrome without diarrhea: Secondary | ICD-10-CM

## 2016-03-08 DIAGNOSIS — F329 Major depressive disorder, single episode, unspecified: Secondary | ICD-10-CM

## 2016-03-08 DIAGNOSIS — Z0001 Encounter for general adult medical examination with abnormal findings: Secondary | ICD-10-CM | POA: Diagnosis not present

## 2016-03-08 DIAGNOSIS — Z0000 Encounter for general adult medical examination without abnormal findings: Secondary | ICD-10-CM

## 2016-03-08 DIAGNOSIS — J45909 Unspecified asthma, uncomplicated: Secondary | ICD-10-CM

## 2016-03-08 LAB — HEPATIC FUNCTION PANEL
ALK PHOS: 112 U/L (ref 33–130)
ALT: 12 U/L (ref 6–29)
AST: 19 U/L (ref 10–35)
Albumin: 3.9 g/dL (ref 3.6–5.1)
BILIRUBIN DIRECT: 0.1 mg/dL (ref <=0.2)
BILIRUBIN INDIRECT: 0.5 mg/dL (ref 0.2–1.2)
BILIRUBIN TOTAL: 0.6 mg/dL (ref 0.2–1.2)
Total Protein: 6.9 g/dL (ref 6.1–8.1)

## 2016-03-08 LAB — LIPID PANEL
CHOLESTEROL: 146 mg/dL (ref 125–200)
HDL: 42 mg/dL — ABNORMAL LOW (ref >=46)
LDL Cholesterol: 76 mg/dL (ref <130)
TRIGLYCERIDES: 141 mg/dL (ref <150)
Total CHOL/HDL Ratio: 3.5 Ratio (ref <=5.0)
VLDL: 28 mg/dL (ref <30)

## 2016-03-08 LAB — CBC WITH DIFFERENTIAL/PLATELET
BASOS ABS: 0 {cells}/uL (ref 0–200)
Basophils Relative: 0 %
EOS PCT: 2 %
Eosinophils Absolute: 84 cells/uL (ref 15–500)
HCT: 37 % (ref 35.0–45.0)
HEMOGLOBIN: 12 g/dL (ref 11.7–15.5)
LYMPHS ABS: 1806 {cells}/uL (ref 850–3900)
Lymphocytes Relative: 43 %
MCH: 27.2 pg (ref 27.0–33.0)
MCHC: 32.4 g/dL (ref 32.0–36.0)
MCV: 83.9 fL (ref 80.0–100.0)
MONOS PCT: 9 %
MPV: 9.8 fL (ref 7.5–12.5)
Monocytes Absolute: 378 cells/uL (ref 200–950)
NEUTROS ABS: 1932 {cells}/uL (ref 1500–7800)
NEUTROS PCT: 46 %
PLATELETS: 273 10*3/uL (ref 140–400)
RBC: 4.41 MIL/uL (ref 3.80–5.10)
RDW: 14.2 % (ref 11.0–15.0)
WBC: 4.2 10*3/uL (ref 3.8–10.8)

## 2016-03-08 LAB — MAGNESIUM: MAGNESIUM: 1.9 mg/dL (ref 1.5–2.5)

## 2016-03-08 LAB — BASIC METABOLIC PANEL WITH GFR
BUN: 10 mg/dL (ref 7–25)
CO2: 29 mmol/L (ref 20–31)
Calcium: 9.1 mg/dL (ref 8.6–10.4)
Chloride: 101 mmol/L (ref 98–110)
Creat: 0.67 mg/dL (ref 0.50–0.99)
GFR, Est African American: >89 mL/min (ref >=60)
Glucose, Bld: 126 mg/dL — ABNORMAL HIGH (ref 65–99)
Potassium: 4 mmol/L (ref 3.5–5.3)
SODIUM: 138 mmol/L (ref 135–146)

## 2016-03-08 LAB — TSH: TSH: 1.51 m[IU]/L

## 2016-03-08 MED ORDER — FLUTICASONE FUROATE-VILANTEROL 100-25 MCG/INH IN AEPB: 1.0000 | INHALATION_SPRAY | Freq: Every day | RESPIRATORY_TRACT | Status: DC

## 2016-03-08 NOTE — Patient Instructions (Addendum)
Asthma Attack Prevention While you may not be able to control the fact that you have asthma, you can take actions to prevent asthma attacks. The best way to prevent asthma attacks is to maintain good control of your asthma. You can achieve this by:  Taking your medicines as directed.  Avoiding things that can irritate your airways or make your asthma symptoms worse (asthma triggers).  Keeping track of how well your asthma is controlled and of any changes in your symptoms.  Responding quickly to worsening asthma symptoms (asthma attack).  Seeking emergency care when it is needed. WHAT ARE SOME WAYS TO PREVENT AN ASTHMA ATTACK? Have a Plan Work with your health care provider to create a written plan for managing and treating your asthma attacks (asthma action plan). This plan includes:  A list of your asthma triggers and how you can avoid them.  Information on when medicines should be taken and when their dosages should be changed.  The use of a device that measures how well your lungs are working (peak flow meter). Monitor Your Asthma Use your peak flow meter and record your results in a journal every day. A drop in your peak flow numbers on one or more days may indicate the start of an asthma attack. This can happen even before you start to feel symptoms. You can prevent an asthma attack from getting worse by following the steps in your asthma action plan. Avoid Asthma Triggers Work with your asthma health care provider to find out what your asthma triggers are. This can be done by:  Allergy testing.  Keeping a journal that notes when asthma attacks occur and the factors that may have contributed to them.  Determining if there are other medical conditions that are making your asthma worse. Once you have determined your asthma triggers, take steps to avoid them. This may include avoiding excessive or prolonged exposure to:  Dust. Have someone dust and vacuum your home for you once or  twice a week. Using a high-efficiency particulate arrestance (HEPA) vacuum is best.  Smoke. This includes campfire smoke, forest fire smoke, and secondhand smoke from tobacco products.  Pet dander. Avoid contact with animals that you know you are allergic to.  Allergens from trees, grasses or pollens. Avoid spending a lot of time outdoors when pollen counts are high, and on very windy days.  Very cold, dry, or humid air.  Mold.  Foods that contain high amounts of sulfites.  Strong odors.  Outdoor air pollutants, such as engine exhaust.  Indoor air pollutants, such as aerosol sprays and fumes from household cleaners.  Household pests, including dust mites and cockroaches, and pest droppings.  Certain medicines, including NSAIDs. Always talk to your health care provider before stopping or starting any new medicines. Medicines Take over-the-counter and prescription medicines only as told by your health care provider. Many asthma attacks can be prevented by carefully following your medicine schedule. Taking your medicines correctly is especially important when you cannot avoid certain asthma triggers. Act Quickly If an asthma attack does happen, acting quickly can decrease how severe it is and how long it lasts. Take these steps:   Pay attention to your symptoms. If you are coughing, wheezing, or having difficulty breathing, do not wait to see if your symptoms go away on their own. Follow your asthma action plan.  If you have followed your asthma action plan and your symptoms are not improving, call your health care provider or seek immediate medical care   at the nearest hospital. It is important to note how often you need to use your fast-acting rescue inhaler. If you are using your rescue inhaler more often, it may mean that your asthma is not under control. Adjusting your asthma treatment plan may help you to prevent future asthma attacks and help you to gain better control of your  condition. HOW CAN I PREVENT AN ASTHMA ATTACK WHEN I EXERCISE? Follow advice from your health care provider about whether you should use your fast-acting inhaler before exercising. Many people with asthma experience exercise-induced bronchoconstriction (EIB). This condition often worsens during vigorous exercise in cold, humid, or dry environments. Usually, people with EIB can stay very active by pre-treating with a fast-acting inhaler before exercising.   This information is not intended to replace advice given to you by your health care provider. Make sure you discuss any questions you have with your health care provider.   Document Released: 08/28/2009 Document Revised: 05/31/2015 Document Reviewed: 02/09/2015 Elsevier Interactive Patient Education 2016 [employer]  

## 2016-03-08 NOTE — Progress Notes (Signed)
MEDICARE ANNUAL WELLNESS VISIT AND FOLLOW UP  Assessment:   1. Essential hypertension - counseled on medication compliance - continue medications, DASH diet, exercise and monitor at home. Call if greater than 130/80.  - CBC with Differential/Platelet - BASIC METABOLIC PANEL WITH GFR - Hepatic function panel - TSH  2. Diabetes mellitus type 2 with neurological manifestations Discussed general issues about diabetes pathophysiology and management., Educational material distributed., Suggested low cholesterol diet., Encouraged aerobic exercise., Discussed foot care., Reminded to get yearly retinal exam. - Hemoglobin A1c  3. Hyperlipidemia -continue medications, check lipids, decrease fatty foods, increase activity. - Lipid panel  4. Asthmatic bronchitis without complication - Controlled, still with chronic cough, discussed stopping losartan and switching to diovan and switching BB to more selective.  - admits to noncompliance with asthma medications, start using daily, will switch from twice a day and give samples of breo once a day.   5. Gastroesophageal reflux disease with esophagitis Continue PPI/H2 blocker, diet discussed  6. IBS (irritable bowel syndrome) .diet controlled  7. Anemia, unspecified anemia type Anemia - monitor, continue iron supp with Vitamin C and increase green leafy veggies  8. Depression - FLUoxetine (PROZAC) 20 MG capsule; Take 1 capsule (20 mg total) by mouth daily.  Dispense: 90 capsule; Refill: 0  9.. Vitamin D deficiency - Vit D  25 hydroxy (rtn osteoporosis monitoring)  10. Noncompliance with medication regimen - Magnesium  11.  B12 deficiency Continue supplement  17. History of colon cancer, no staging Follow up GI  Over 30 minutes of exam, counseling, chart review, and critical decision making was performed  Plan:   During the course of the visit the patient was educated and counseled about appropriate screening and preventive services  including:    Pneumococcal vaccine   Influenza vaccine  Td vaccine  Screening electrocardiogram  Screening mammography  Bone densitometry screening  Colorectal cancer screening  Diabetes screening  Glaucoma screening  Nutrition counseling   Advanced directives: given info/requested  Conditions/risks identified: Diabetes is not at goal, ACE/ARB therapy: Yes. Urinary Incontinence is not an issue: discussed non pharmacology and pharmacology options.  Fall risk: low- discussed PT, home fall assessment, medications.    Subjective:   Nancy Myers is a 70 y.o. female who presents for Medicare Annual Wellness Visit and 3 month follow up on hypertension, diabetes, hyperlipidemia, vitamin D def.  Date of last medicare wellness visit was 12/10/2014  Her blood pressure has been controlled at home, today their BP is BP: 140/90 mmHg (did not take meds this am) She does workout. She denies chest pain, dizziness. She has chronic shortness of breath with exertion.  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  She follows with Dr. Wynonia Lawman for history of CP/SOB, on bASA, no CV complaints.  She is on cholesterol medication and denies myalgias. Her cholesterol is not at goal less than 70. The cholesterol last visit was:   Lab Results  Component Value Date   CHOL 229* 10/19/2015   HDL 40* 10/19/2015   LDLCALC 159* 10/19/2015   TRIG 152* 10/19/2015   CHOLHDL 5.7* 10/19/2015  She has been working on diet and exercise for Diabetes with diabetic polyneuropathy, she is on bASA, she is on ACE/ARB, and denies polydipsia, polyuria and visual disturbances. Last A1C was:  Lab Results  Component Value Date   HGBA1C 7.2* 10/19/2015   Lab Results  Component Value Date   GFRAA >89 10/19/2015   Patient is on Vitamin D supplement. Lab Results  Component Value Date   VD25OH 30 03/31/2015   She has asthma and allergies, follows with Dr. Annamaria Boots, she is on albuterol and last PFT was  09/2013.  She is on prozac for depression/anxiety.  She has b12 def and is on b12 shots.    Medication Review Current Outpatient Prescriptions on File Prior to Visit  Medication Sig Dispense Refill  . acyclovir (ZOVIRAX) 400 MG tablet Take 1 pill two times daily as needed 60 tablet 0  . albuterol (PROVENTIL HFA;VENTOLIN HFA) 108 (90 BASE) MCG/ACT inhaler 2 puffs every 6 hours if needed- rescue inhaler 3 Inhaler 3  . aspirin 81 MG chewable tablet Chew 162 mg by mouth daily.     Marland Kitchen atorvastatin (LIPITOR) 40 MG tablet Take 1 tablet (40 mg total) by mouth daily. 90 tablet 3  . Blood Glucose Monitoring Suppl (FREESTYLE FREEDOM LITE) W/DEVICE KIT Check Blood Sugar One Time Daily 1 each PRN  . budesonide (PULMICORT) 0.5 MG/2ML nebulizer solution USE 1 VIAL VIA NEBULIZER TWICE A DAY 120 mL 2  . carisoprodol (SOMA) 250 MG tablet Take 1 tablet (250 mg total) by mouth every 6 (six) hours as needed. 180 tablet 1  . cetirizine (ZYRTEC) 10 MG tablet Take 10 mg by mouth daily.    . Cholecalciferol (VITAMIN D PO) Take 5,000 Units by mouth.    . cyanocobalamin (,VITAMIN B-12,) 1000 MCG/ML injection INJECT 1 ML (1000 MCG TOTAL) INTRAMUSCULARLY EVERY 30 DAYS 10 mL 0  . FLUoxetine (PROZAC) 20 MG capsule Take 1 capsule (20 mg total) by mouth daily. 90 capsule 3  . glucose blood test strip Test Blood Sugar One Time Daily 300 each PRN  . hydrochlorothiazide (HYDRODIURIL) 25 MG tablet Take 1 tablet (25 mg total) by mouth daily. 90 tablet 3  . hydrocortisone (ANUSOL-HC) 25 MG suppository Place 1 suppository (25 mg total) rectally 2 (two) times daily. 100 suppository 0  . ipratropium-albuterol (DUONEB) 0.5-2.5 (3) MG/3ML SOLN USE 1 VIA VIA NEBULIZER 4 TIMES A DAY 360 mL 2  . Lancets (FREESTYLE) lancets Test Blood Sugar One Time Daily 100 each PRN  . losartan (COZAAR) 100 MG tablet Take 1 tablet (100 mg total) by mouth daily. 90 tablet 3  . Magnesium 500 MG TABS Take 500 mg by mouth daily.     . metFORMIN (GLUCOPHAGE)  500 MG tablet Take 2 tablets (1,000 mg total) by mouth daily with breakfast. 180 tablet 3  . metoprolol (TOPROL-XL) 200 MG 24 hr tablet Take 1 tablet (200 mg total) by mouth daily. 90 tablet 3  . Misc Natural Products (OSTEO BI-FLEX JOINT SHIELD PO) Take by mouth.    Marland Kitchen OVER THE COUNTER MEDICATION OTC slow iron 1 daily    . OVER THE COUNTER MEDICATION OTC fiber laxative daily    . SYRINGE-NEEDLE, DISP, 3 ML 25G X 5/8" 3 ML MISC Use as directed to inject B12 monthly 100 each 0  . vitamin C (ASCORBIC ACID) 500 MG tablet Take 500 mg by mouth daily.     No current facility-administered medications on file prior to visit.    Current Problems (verified) Patient Active Problem List   Diagnosis Date Noted  . Medication management 03/31/2015  . B12 deficiency 12/22/2014  . History of colon cancer, no staging 12/22/2014  . Noncompliance with medication regimen 08/24/2014  . Scintillating scotoma of both eyes 08/16/2013  . Hyperlipidemia   . 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 GERD (gastroesophageal reflux disease)   . IBS (irritable bowel  syndrome)   . Vitamin D deficiency   . Anemia 08/08/2009  . Depression, controlled 08/08/2009  . Essential hypertension 08/08/2009  . Asthmatic bronchitis without complication 27/74/1287   Screening Tests Immunization History  Administered Date(s) Administered  . Influenza Split 06/23/2013  . Influenza, High Dose Seasonal PF 08/01/2014, 07/19/2015  . Pneumococcal Conjugate-13 09/21/2014  . Pneumococcal-Unspecified 09/03/2012  . Td 10/03/2010   Preventative care: Tetanus: 2012  Pneumovax:2013  Prevnar 13: 2015 Flu vaccine: 2016  Zostavax: declines due to cost  Pap: 2010  Declines another MGM: 11/2015 DEXA: declines Colonoscopy: 2013  Dr. Cecelia Byars this year in Oct EGD: N/A FTs 09/2013 CT pelvis 01/2012 CXR 07/2013 Stress test 2014 Echo 08/2013  Names of Other Physician/Practitioners you currently use: 1. [facility]- here for primary care 2. Dr. Claudean Kinds, eye doctor, last visit 10/2015 3. Dr. Deatra Ina, dentist, last visit 2 years Patient Care Team: Unk Pinto, MD as PCP - General (Internal Medicine) Laurence Spates, MD as Consulting Physician (Gastroenterology) Pixie Casino, MD as Consulting Physician (Cardiology) Deneise Lever, MD as Consulting Physician (Pulmonary Disease) Dr. Marlou Sa, ortho for left shoulder  Past Surgical History  Procedure Laterality Date  . Bunionectomy Bilateral 02/1987  . Carpal tunnel release Bilateral 09-30-01;10-21-01  . Anterior cruciate ligament repair Right 04-09-04    right knee  . Left knee  07-26-02  . Left foot  05-23-05  . Bunionectomy Right 03-08-08    right foot  . Cataract extraction w/ intraocular lens  implant, bilateral Bilateral 12-07-09;02-15-10   Family History  Problem Relation Age of Onset  . Heart disease Father     MI  . Stroke Mother   . Rheum arthritis Mother   . Heart attack Brother     half brother  . Heart attack Maternal Uncle     half maternal uncle   Social History  Substance Use Topics  . Smoking status: Former Smoker    Types: Cigarettes    Quit date: 09/23/1986  . Smokeless tobacco: Never Used  . Alcohol Use: No    MEDICARE WELLNESS OBJECTIVES: Tobacco use: She does not smoke.  Patient is a former smoker. Alcohol Current alcohol use: none Caffeine Current caffeine use: coffee 1-2 /day Osteoporosis: postmenopausal estrogen deficiency and dietary calcium and/or vitamin D deficiency, History of fracture in the past year: no Diet: in general, a "healthy" diet   Physical activity: yard work and walking Depression/mood screen:  Yes - No Depression Hearing: normal Visual acuity: normal,  does perform annual eye exam  ADLs: self care Fall risk: Low Risk Home safety: good Cognitive Testing  Alert? Yes  Normal Appearance?Yes  Oriented to person? Yes  Place? Yes   Time? Yes  Recall of three objects?   Yes  Can perform simple calculations? Yes  Displays appropriate judgment?Yes  Can read the correct time from a watch face?Yes EOL planning: No  and Information given   Objective:   Blood pressure 140/90, pulse 77, temperature 97.3 F (36.3 C), temperature source Temporal, resp. rate 16, height 5' 1"  (1.549 m), weight 147 lb 3.2 oz (66.769 kg), SpO2 97 %. Body mass index is 27.83 kg/(m^2).  General appearance: alert, no distress, WD/WN,  female HEENT: normocephalic, sclerae anicteric, TMs pearly, nares patent, no discharge or erythema, pharynx normal Oral cavity: MMM, no lesions Neck: supple, no lymphadenopathy, no thyromegaly, no masses Heart: RRR, normal S1, S2, no murmurs Lungs: CTA bilaterally, no wheezes, rhonchi, or rales Abdomen: +bs, soft, non  tender, non distended, no masses, no hepatomegaly, no splenomegaly Musculoskeletal: nontender, no swelling, no obvious deformity Extremities: no edema, no cyanosis, no clubbing Pulses: 2+ symmetric, upper and lower extremities, normal cap refill Neurological: alert, oriented x 3, CN2-12 intact, strength normal upper extremities and lower extremities, sensation decreased bilateral feet, DTRs 2+ throughout, no cerebellar signs, gait normal Psychiatric: normal affect, behavior normal, pleasant  Breast: defer Gyn: defer Rectal: defer  Medicare Attestation I have personally reviewed: The patient's medical and social history Their use of alcohol, tobacco or illicit drugs Their current medications and supplements The patient's functional ability including ADLs,fall risks, home safety risks, cognitive, and hearing and visual impairment Diet and physical activities Evidence for depression or mood disorders  The patient's weight, height, BMI, and visual acuity have been recorded in the chart.  I have made referrals, counseling, and provided education to the patient based on review of the above and I have provided the patient with a written  personalized care plan for preventive services.     Vicie Mutters, PA-C   03/08/2016

## 2016-03-09 LAB — HEMOGLOBIN A1C
Hgb A1c MFr Bld: 7.1 % — ABNORMAL HIGH (ref <5.7)
Mean Plasma Glucose: 157 mg/dL

## 2016-03-19 ENCOUNTER — Other Ambulatory Visit: Payer: Self-pay | Admitting: Physician Assistant

## 2016-03-22 ENCOUNTER — Other Ambulatory Visit: Payer: Self-pay

## 2016-03-22 MED ORDER — FLUTICASONE-SALMETEROL 250-50 MCG/DOSE IN AEPB: 1.0000 | INHALATION_SPRAY | Freq: Two times a day (BID) | RESPIRATORY_TRACT | Status: DC

## 2016-06-05 ENCOUNTER — Other Ambulatory Visit: Payer: Self-pay | Admitting: Physician Assistant

## 2016-06-05 DIAGNOSIS — F329 Major depressive disorder, single episode, unspecified: Secondary | ICD-10-CM

## 2016-06-05 DIAGNOSIS — F32A Depression, unspecified: Secondary | ICD-10-CM

## 2016-06-10 ENCOUNTER — Encounter: Payer: Self-pay | Admitting: Physician Assistant

## 2016-06-10 ENCOUNTER — Ambulatory Visit (INDEPENDENT_AMBULATORY_CARE_PROVIDER_SITE_OTHER): Payer: Medicare Other | Admitting: Physician Assistant

## 2016-06-10 VITALS — BP 138/80 | HR 79 | Temp 97.0°F | Resp 16 | Ht 61.0 in | Wt 148.8 lb

## 2016-06-10 DIAGNOSIS — E559 Vitamin D deficiency, unspecified: Secondary | ICD-10-CM

## 2016-06-10 DIAGNOSIS — E1149 Type 2 diabetes mellitus with other diabetic neurological complication: Secondary | ICD-10-CM | POA: Diagnosis not present

## 2016-06-10 DIAGNOSIS — Z79899 Other long term (current) drug therapy: Secondary | ICD-10-CM

## 2016-06-10 DIAGNOSIS — F329 Major depressive disorder, single episode, unspecified: Secondary | ICD-10-CM | POA: Diagnosis not present

## 2016-06-10 DIAGNOSIS — Z23 Encounter for immunization: Secondary | ICD-10-CM | POA: Diagnosis not present

## 2016-06-10 DIAGNOSIS — F325 Major depressive disorder, single episode, in full remission: Secondary | ICD-10-CM | POA: Diagnosis not present

## 2016-06-10 DIAGNOSIS — F32A Depression, unspecified: Secondary | ICD-10-CM

## 2016-06-10 DIAGNOSIS — I10 Essential (primary) hypertension: Secondary | ICD-10-CM | POA: Diagnosis not present

## 2016-06-10 DIAGNOSIS — E785 Hyperlipidemia, unspecified: Secondary | ICD-10-CM | POA: Diagnosis not present

## 2016-06-10 LAB — CBC WITH DIFFERENTIAL/PLATELET
Basophils Absolute: 47 cells/uL (ref 0–200)
Basophils Relative: 1 %
EOS ABS: 141 {cells}/uL (ref 15–500)
Eosinophils Relative: 3 %
HEMATOCRIT: 37.3 % (ref 35.0–45.0)
HEMOGLOBIN: 12.2 g/dL (ref 11.7–15.5)
LYMPHS ABS: 1974 {cells}/uL (ref 850–3900)
Lymphocytes Relative: 42 %
MCH: 27.4 pg (ref 27.0–33.0)
MCHC: 32.7 g/dL (ref 32.0–36.0)
MCV: 83.6 fL (ref 80.0–100.0)
MONO ABS: 564 {cells}/uL (ref 200–950)
MPV: 10.5 fL (ref 7.5–12.5)
Monocytes Relative: 12 %
NEUTROS ABS: 1974 {cells}/uL (ref 1500–7800)
NEUTROS PCT: 42 %
Platelets: 289 10*3/uL (ref 140–400)
RBC: 4.46 MIL/uL (ref 3.80–5.10)
RDW: 14.2 % (ref 11.0–15.0)
WBC: 4.7 10*3/uL (ref 3.8–10.8)

## 2016-06-10 LAB — LIPID PANEL
CHOL/HDL RATIO: 3.3 ratio (ref <=5.0)
Cholesterol: 184 mg/dL (ref 125–200)
HDL: 56 mg/dL (ref >=46)
LDL CALC: 108 mg/dL (ref <130)
Triglycerides: 101 mg/dL (ref <150)
VLDL: 20 mg/dL (ref <30)

## 2016-06-10 LAB — BASIC METABOLIC PANEL WITH GFR
BUN: 9 mg/dL (ref 7–25)
CHLORIDE: 100 mmol/L (ref 98–110)
CO2: 28 mmol/L (ref 20–31)
CREATININE: 0.63 mg/dL (ref 0.50–0.99)
Calcium: 9.3 mg/dL (ref 8.6–10.4)
GFR, Est African American: >89 mL/min (ref >=60)
GFR, Est Non African American: >89 mL/min (ref >=60)
GLUCOSE: 109 mg/dL — AB (ref 65–99)
Potassium: 4.4 mmol/L (ref 3.5–5.3)
SODIUM: 139 mmol/L (ref 135–146)

## 2016-06-10 LAB — HEPATIC FUNCTION PANEL
ALK PHOS: 98 U/L (ref 33–130)
ALT: 12 U/L (ref 6–29)
AST: 21 U/L (ref 10–35)
Albumin: 4.2 g/dL (ref 3.6–5.1)
BILIRUBIN DIRECT: 0.1 mg/dL (ref <=0.2)
BILIRUBIN INDIRECT: 0.5 mg/dL (ref 0.2–1.2)
TOTAL PROTEIN: 7.3 g/dL (ref 6.1–8.1)
Total Bilirubin: 0.6 mg/dL (ref 0.2–1.2)

## 2016-06-10 LAB — TSH: TSH: 1.38 mIU/L

## 2016-06-10 MED ORDER — VALSARTAN 320 MG PO TABS: ORAL_TABLET | ORAL | 1 refills | Status: DC

## 2016-06-10 MED ORDER — FLUOXETINE HCL 40 MG PO CAPS: 40.0000 mg | ORAL_CAPSULE | Freq: Every day | ORAL | 3 refills | Status: DC

## 2016-06-10 NOTE — Progress Notes (Signed)
3 MONTH FOLLOW UP  Assessment:   Essential hypertension - counseled on medication compliance - continue medications, DASH diet, exercise and monitor at home. Call if greater than 130/80.  - CBC with Differential/Platelet - BASIC METABOLIC PANEL WITH GFR - Hepatic function panel - TSH  Diabetes mellitus type 2 with neurological manifestations Discussed general issues about diabetes pathophysiology and management., Educational material distributed., Suggested low cholesterol diet., Encouraged aerobic exercise., Discussed foot care., Reminded to get yearly retinal exam. - Hemoglobin A1c  Hyperlipidemia -continue medications, check lipids, decrease fatty foods, increase activity. - Lipid panel   Asthmatic bronchitis without complication - Controlled, still with chronic cough, switched losartan to diovan - admits to noncompliance with asthma medications, start using daily, will switch from twice a day and give samples of breo once a day.    Depression - FLUoxetine (PROZAC) 40 MG capsule; Take 1 capsule (40 mg total) by mouth daily.  Dispense: 90 capsule; Refill: 0, increase to 59m  Vitamin D deficiency - Vit D  25 hydroxy (rtn osteoporosis monitoring)   Over 30 minutes of exam, counseling, chart review, and critical decision making was performed   Subjective:   Nancy FREESis a 70y.o. female who presents for 3 month follow up on hypertension, diabetes, hyperlipidemia, vitamin D def.   Her blood pressure has been controlled at home, today their BP is BP: 138/80 She does workout. She denies chest pain, dizziness. She has chronic shortness of breath with exertion.  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 Separated x 20 years, now divorcing.  She follows with Dr. TWynonia Lawmanfor history of CP/SOB, on bASA, no CV complaints.  She is on cholesterol medication and denies  myalgias. Her cholesterol is not at goal less than 70. The cholesterol last visit was:   Lab Results  Component Value Date   CHOL 146 03/08/2016   HDL 42 (L) 03/08/2016   LDLCALC 76 03/08/2016   TRIG 141 03/08/2016   CHOLHDL 3.5 03/08/2016  She has been working on diet and exercise for Diabetes with diabetic polyneuropathy, she is on bASA, she is on ACE/ARB, and denies polydipsia, polyuria and visual disturbances. Last A1C was:  Lab Results  Component Value Date   HGBA1C 7.1 (H) 03/08/2016   Lab Results  Component Value Date   GFRAA >89 03/08/2016   Patient is on Vitamin D supplement. Lab Results  Component Value Date   VD25OH 30 03/31/2015   She has asthma and allergies, follows with Dr. YAnnamaria Boots she is on albuterol and last PFT was 09/2013.  She is on prozac for depression/anxiety.  She has b12 def and is on b12 shots.    Medication Review Current Outpatient Prescriptions on File Prior to Visit  Medication Sig Dispense Refill  . acyclovir (ZOVIRAX) 400 MG tablet Take 1 pill two times daily as needed 60 tablet 0  . albuterol (PROVENTIL HFA;VENTOLIN HFA) 108 (90 BASE) MCG/ACT inhaler 2 puffs every 6 hours if needed- rescue inhaler 3 Inhaler 3  . aspirin 81 MG chewable tablet Chew 162 mg by mouth daily.     .Marland Kitchenatorvastatin (LIPITOR) 40 MG tablet Take 1 tablet (40 mg total) by mouth daily. 90 tablet 3  . Blood Glucose Monitoring Suppl (FREESTYLE FREEDOM LITE) W/DEVICE KIT Check Blood Sugar One Time Daily 1 each PRN  . budesonide (PULMICORT) 0.5 MG/2ML nebulizer solution USE 1  VIAL VIA NEBULIZER TWICE A DAY 120 mL 2  . carisoprodol (SOMA) 250 MG tablet Take 1 tablet (250 mg total) by mouth every 6 (six) hours as needed. 180 tablet 1  . cetirizine (ZYRTEC) 10 MG tablet Take 10 mg by mouth daily.    . Cholecalciferol (VITAMIN D PO) Take 5,000 Units by mouth.    . cyanocobalamin (,VITAMIN B-12,) 1000 MCG/ML injection INJECT 1 ML (1000 MCG TOTAL) INTRAMUSCULARLY EVERY 30 DAYS 10 mL 0  .  FLUoxetine (PROZAC) 20 MG capsule TAKE 1 CAPSULE DAILY 30 capsule 0  . fluticasone furoate-vilanterol (BREO ELLIPTA) 100-25 MCG/INH AEPB Inhale 1 puff into the lungs daily. Rinse mouth with water after each use 60 each 0  . Fluticasone-Salmeterol (ADVAIR DISKUS) 250-50 MCG/DOSE AEPB Inhale 1 puff into the lungs 2 (two) times daily. 1 each 3  . glucose blood test strip Test Blood Sugar One Time Daily 300 each PRN  . hydrochlorothiazide (HYDRODIURIL) 25 MG tablet Take 1 tablet (25 mg total) by mouth daily. 90 tablet 3  . hydrocortisone (ANUSOL-HC) 25 MG suppository Place 1 suppository (25 mg total) rectally 2 (two) times daily. 100 suppository 0  . ipratropium-albuterol (DUONEB) 0.5-2.5 (3) MG/3ML SOLN USE 1 VIA VIA NEBULIZER 4 TIMES A DAY 360 mL 2  . Lancets (FREESTYLE) lancets Test Blood Sugar One Time Daily 100 each PRN  . losartan (COZAAR) 100 MG tablet Take 1 tablet (100 mg total) by mouth daily. 90 tablet 3  . Magnesium 500 MG TABS Take 500 mg by mouth daily.     . metFORMIN (GLUCOPHAGE) 500 MG tablet Take 2 tablets (1,000 mg total) by mouth daily with breakfast. 180 tablet 3  . metoprolol (TOPROL-XL) 200 MG 24 hr tablet Take 1 tablet (200 mg total) by mouth daily. 90 tablet 3  . Misc Natural Products (OSTEO BI-FLEX JOINT SHIELD PO) Take by mouth.    Marland Kitchen OVER THE COUNTER MEDICATION OTC slow iron 1 daily    . OVER THE COUNTER MEDICATION OTC fiber laxative daily    . SYRINGE-NEEDLE, DISP, 3 ML 25G X 5/8" 3 ML MISC Use as directed to inject B12 monthly 100 each 0  . vitamin C (ASCORBIC ACID) 500 MG tablet Take 500 mg by mouth daily.     No current facility-administered medications on file prior to visit.     Current Problems (verified) Patient Active Problem List   Diagnosis Date Noted  . Encounter for Medicare annual wellness exam 03/08/2016  . Medication management 03/31/2015  . B12 deficiency 12/22/2014  . History of colon cancer, no staging 12/22/2014  . Noncompliance with medication  regimen 08/24/2014  . Scintillating scotoma of both eyes 08/16/2013  . Hyperlipidemia   . 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 GERD (gastroesophageal reflux disease)   . IBS (irritable bowel syndrome)   . Vitamin D deficiency   . Anemia 08/08/2009  . Depression, controlled 08/08/2009  . Essential hypertension 08/08/2009  . Asthmatic bronchitis without complication 50/05/3817   Review of Systems  Constitutional: Positive for malaise/fatigue. Negative for diaphoresis, fever and weight loss.  HENT: Negative.   Eyes: Negative.   Respiratory: Positive for cough. Negative for hemoptysis, sputum production, shortness of breath and wheezing.   Cardiovascular: Negative.   Gastrointestinal: Negative.   Genitourinary: Negative.   Musculoskeletal: Negative.   Skin: Negative.   Neurological: Positive for tingling and sensory change. Negative for dizziness, tremors, speech change, focal weakness, seizures, loss of consciousness and weakness.  Psychiatric/Behavioral: Negative.  Objective:   Blood pressure 138/80, pulse 79, temperature 97 F (36.1 C), resp. rate 16, height _0  (1.549 m), weight 148 lb 12.8 oz (67.5 kg), SpO2 (!) 79 %. Body mass index is 28.12 kg/m.  General appearance: alert, no distress, WD/WN,  female HEENT: normocephalic, sclerae anicteric, TMs pearly, nares patent, no discharge or erythema, pharynx normal Oral cavity: MMM, no lesions Neck: supple, no lymphadenopathy, no thyromegaly, no masses Heart: RRR, normal S1, S2, no murmurs Lungs: CTA bilaterally, no wheezes, rhonchi, or rales Abdomen: +bs, soft, non tender, non distended, no masses, no hepatomegaly, no splenomegaly Musculoskeletal: nontender, no swelling, no obvious deformity Extremities: no edema, no cyanosis, no clubbing Pulses: 2+ symmetric, upper and lower extremities, normal cap refill Neurological: alert, oriented x 3, CN2-12 intact, strength normal upper extremities and lower extremities,  sensation decreased bilateral feet, DTRs 2+ throughout, no cerebellar signs, gait normal Psychiatric: normal affect, behavior normal, pleasant     Vicie Mutters, PA-C   06/10/2016

## 2016-06-11 LAB — HEMOGLOBIN A1C
Hgb A1c MFr Bld: 6.6 % — ABNORMAL HIGH (ref <5.7)
Mean Plasma Glucose: 143 mg/dL

## 2016-07-08 ENCOUNTER — Ambulatory Visit (INDEPENDENT_AMBULATORY_CARE_PROVIDER_SITE_OTHER): Payer: Medicare Other | Admitting: Internal Medicine

## 2016-07-08 ENCOUNTER — Encounter: Payer: Self-pay | Admitting: Internal Medicine

## 2016-07-08 VITALS — BP 162/84 | HR 68 | Temp 98.2°F | Resp 18 | Ht 61.0 in

## 2016-07-08 DIAGNOSIS — R059 Cough, unspecified: Secondary | ICD-10-CM

## 2016-07-08 DIAGNOSIS — J209 Acute bronchitis, unspecified: Secondary | ICD-10-CM

## 2016-07-08 DIAGNOSIS — R05 Cough: Secondary | ICD-10-CM | POA: Diagnosis not present

## 2016-07-08 MED ORDER — PREDNISONE 20 MG PO TABS: ORAL_TABLET | ORAL | 0 refills | Status: DC

## 2016-07-08 MED ORDER — HYDROCODONE-HOMATROPINE 5-1.5 MG PO TABS: 1.0000 | ORAL_TABLET | Freq: Three times a day (TID) | ORAL | 0 refills | Status: DC | PRN

## 2016-07-08 MED ORDER — LEVOFLOXACIN 750 MG PO TABS: 750.0000 mg | ORAL_TABLET | Freq: Every day | ORAL | 0 refills | Status: DC

## 2016-07-08 MED ORDER — IPRATROPIUM BROMIDE 0.02 % IN SOLN
0.5000 mg | Freq: Once | RESPIRATORY_TRACT | Status: AC
  Administered 2016-07-08: 0.5 mg via RESPIRATORY_TRACT

## 2016-07-08 MED ORDER — FLUTICASONE PROPIONATE 50 MCG/ACT NA SUSP: 2.0000 | Freq: Every day | NASAL | 0 refills | Status: DC

## 2016-07-08 MED ORDER — IPRATROPIUM-ALBUTEROL 0.5-2.5 (3) MG/3ML IN SOLN: 3.0000 mL | Freq: Four times a day (QID) | RESPIRATORY_TRACT | 2 refills | Status: DC | PRN

## 2016-07-08 NOTE — Patient Instructions (Signed)
Please take the prednisone as prescribed until it is gone completely.  Please use your advair inhaler twice daily.  Please use duoneb every 4-6 hours for coughing and wheezing.  Please take claritin, zyrtec, or allegra once daily to help dry up congestion and to help stop any itching from the cough tablet.  Please take the levaquin until it is gone.  Please drink plenty of water.  Please use flonase 2 sprays per nostril until this infection resolves.  If at any point in time you have worsening shortness of breath, fevers which don't respond to medicines or any other concerning symptoms please go to the ER.

## 2016-07-08 NOTE — Progress Notes (Signed)
HPI  Patient presents to the office for evaluation of cough and shortness of breath.  It has been going on for 4 days.  Patient reports night > day, wet, barky, worse with lying down.  They also endorse change in voice, chills, postnasal drip, shortness of breath, wheezing and clear sputum production, nasal congestion, headache, ear pain, clear rhinorrhea.  .  They have tried cough drops, throat losenges, no OTC meds.  They report that nothing has worked.  They admits to other sick contacts.  She reports that her daughter was diagnosed with the flu.  She reports that she was told this at an urgent care.    Review of Systems  Constitutional: Negative for chills, fever and malaise/fatigue.  HENT: Negative for congestion, ear pain and sore throat.   Eyes: Negative.   Respiratory: Negative for cough, shortness of breath and wheezing.   Cardiovascular: Negative for chest pain, palpitations and leg swelling.  Gastrointestinal: Negative for abdominal pain, blood in stool, constipation, diarrhea, heartburn and melena.  Genitourinary: Negative.   Skin: Negative.   Neurological: Negative for dizziness, sensory change, loss of consciousness and headaches.  Psychiatric/Behavioral: Negative for depression. The patient is not nervous/anxious and does not have insomnia.     PE:  Vitals:   07/08/16 0944  BP: (!) 162/84  Pulse: 68  Resp: 18  Temp: 98.2 F (36.8 C)    General:  Alert and non-toxic, WDWN, NAD HEENT: NCAT, PERLA, EOM normal, no occular discharge or erythema.  Nasal mucosal edema with sinus tenderness to palpation.  Oropharynx clear with minimal oropharyngeal edema and erythema.  Mucous membranes moist and pink. Neck:  Cervical adenopathy Chest:  RRR no MRGs.  Lungs clear to auscultation A&P with no wheezes rhonchi or rales.   Abdomen: +BS x 4 quadrants, soft, non-tender, no guarding, rigidity, or rebound. Skin: warm and dry no rash Neuro: A&Ox4, CN II-XII grossly intact  Assessment  and Plan:   1. Cough  - ipratropium (ATROVENT) nebulizer solution 0.5 mg; Take 2.5 mLs (0.5 mg total) by nebulization once.  2. Acute bronchitis, unspecified organism  - predniSONE (DELTASONE) 20 MG tablet; 3 tabs po daily x 3 days, then 2 tabs x 3 days, then 1.5 tabs x 3 days, then 1 tab x 3 days, then 0.5 tabs x 3 days  Dispense: 27 tablet; Refill: 0 - levofloxacin (LEVAQUIN) 750 MG tablet; Take 1 tablet (750 mg total) by mouth daily. X 7 days  Dispense: 7 tablet; Refill: 0 - ipratropium-albuterol (DUONEB) 0.5-2.5 (3) MG/3ML SOLN; Take 3 mLs by nebulization every 6 (six) hours as needed (wheezing and bronchitis).  Dispense: 360 mL; Refill: 2 - Hydrocodone-Homatropine 5-1.5 MG TABS; Take 1 tablet by mouth 3 (three) times daily as needed (severe cough).  Dispense: 40 tablet; Refill: 0 - fluticasone (FLONASE) 50 MCG/ACT nasal spray; Place 2 sprays into both nostrils daily.  Dispense: 16 g; Refill: 0 -daily antihistamine -patient aware that if she does have worsening of breathing or symptoms that she is to go to the ER.

## 2016-07-10 ENCOUNTER — Other Ambulatory Visit: Payer: Self-pay | Admitting: *Deleted

## 2016-07-10 MED ORDER — METFORMIN HCL 500 MG PO TABS: 1000.0000 mg | ORAL_TABLET | Freq: Every day | ORAL | 3 refills | Status: DC

## 2016-07-16 ENCOUNTER — Encounter: Payer: Self-pay | Admitting: Internal Medicine

## 2016-07-16 ENCOUNTER — Ambulatory Visit (INDEPENDENT_AMBULATORY_CARE_PROVIDER_SITE_OTHER): Payer: Medicare Other | Admitting: Internal Medicine

## 2016-07-16 VITALS — BP 158/100 | HR 102 | Temp 98.2°F | Resp 18 | Ht 61.0 in

## 2016-07-16 DIAGNOSIS — J441 Chronic obstructive pulmonary disease with (acute) exacerbation: Secondary | ICD-10-CM

## 2016-07-16 MED ORDER — BUDESONIDE 0.5 MG/2ML IN SUSP: 0.5000 mg | Freq: Two times a day (BID) | RESPIRATORY_TRACT | 12 refills | Status: DC

## 2016-07-16 MED ORDER — PREDNISONE 20 MG PO TABS: ORAL_TABLET | ORAL | 0 refills | Status: DC

## 2016-07-16 NOTE — Progress Notes (Signed)
Assessment and Plan:   1. COPD exacerbation ([place]) -another 5 days of prednisone -finish full taper of prednisone -added in pulmicort as patient was not actually using an ICS despite having advair on her medication list -add in stiolto -cont nebulizer duoneb q6hrs -cont hycodan tablets prn -work leave extended for next week -cont flonase -if worsening breathing patient to go to the ER.    HPI 70 y.o.female presents for 1 week follow up of acute bronchitis.  Patient reports that they have continued to have chest tightness and wheezing.  She reports that she still has hot and cold chills.  She reports that she is coughing a lot still.  She is using her nebulizers every 4-6 hours.  She feels a little bit better than she did, but she still doesn't feel good.  female is taking their medication.  She has finished the antibiotic. They did have difficulty with their prednisone and she didn't understand the prescription.  They report no adverse reactions. She is not bringing anything up with the cough anymore.  She just still doesn't feel good.     Past Medical History:  Diagnosis Date  . Anemia   . Anxiety   . Arrhythmia   . Cancer [facility]) 1995   Colon Cancer  . Depression   . Diabetes mellitus without complication ([place])   . GERD (gastroesophageal reflux disease)   . Hyperlipidemia   . Hypertension   . IBS (irritable bowel syndrome)   . Vitamin D deficiency      Allergies  Allergen Reactions  . Codeine Itching      Current Outpatient Prescriptions on File Prior to Visit  Medication Sig Dispense Refill  . acyclovir (ZOVIRAX) 400 MG tablet Take 1 pill two times daily as needed 60 tablet 0  . albuterol (PROVENTIL HFA;VENTOLIN HFA) 108 (90 BASE) MCG/ACT inhaler 2 puffs every 6 hours if needed- rescue inhaler 3 Inhaler 3  . aspirin 81 MG chewable tablet Chew 162 mg by mouth daily.     Marland Kitchen atorvastatin (LIPITOR) 40 MG tablet Take 1 tablet (40 mg total) by mouth daily. 90 tablet 3  . Blood  Glucose Monitoring Suppl (FREESTYLE FREEDOM LITE) W/DEVICE KIT Check Blood Sugar One Time Daily 1 each PRN  . carisoprodol (SOMA) 250 MG tablet Take 1 tablet (250 mg total) by mouth every 6 (six) hours as needed. 180 tablet 1  . cetirizine (ZYRTEC) 10 MG tablet Take 10 mg by mouth daily.    . Cholecalciferol (VITAMIN D PO) Take 5,000 Units by mouth.    . cyanocobalamin (,VITAMIN B-12,) 1000 MCG/ML injection INJECT 1 ML (1000 MCG TOTAL) INTRAMUSCULARLY EVERY 30 DAYS 10 mL 0  . FLUoxetine (PROZAC) 40 MG capsule Take 1 capsule (40 mg total) by mouth daily. 30 capsule 3  . fluticasone (FLONASE) 50 MCG/ACT nasal spray Place 2 sprays into both nostrils daily. 16 g 0  . Fluticasone-Salmeterol (ADVAIR DISKUS) 250-50 MCG/DOSE AEPB Inhale 1 puff into the lungs 2 (two) times daily. 1 each 3  . glucose blood test strip Test Blood Sugar One Time Daily 300 each PRN  . hydrochlorothiazide (HYDRODIURIL) 25 MG tablet Take 1 tablet (25 mg total) by mouth daily. 90 tablet 3  . Hydrocodone-Homatropine 5-1.5 MG TABS Take 1 tablet by mouth 3 (three) times daily as needed (severe cough). 40 tablet 0  . hydrocortisone (ANUSOL-HC) 25 MG suppository Place 1 suppository (25 mg total) rectally 2 (two) times daily. 100 suppository 0  . ipratropium-albuterol (DUONEB) 0.5-2.5 (3) MG/3ML  SOLN Take 3 mLs by nebulization every 6 (six) hours as needed (wheezing and bronchitis). 360 mL 2  . Lancets (FREESTYLE) lancets Test Blood Sugar One Time Daily 100 each PRN  . Magnesium 500 MG TABS Take 500 mg by mouth daily.     . metFORMIN (GLUCOPHAGE) 500 MG tablet Take 2 tablets (1,000 mg total) by mouth daily with breakfast. 180 tablet 3  . metoprolol (TOPROL-XL) 200 MG 24 hr tablet Take 1 tablet (200 mg total) by mouth daily. 90 tablet 3  . Misc Natural Products (OSTEO BI-FLEX JOINT SHIELD PO) Take by mouth.    Marland Kitchen OVER THE COUNTER MEDICATION OTC slow iron 1 daily    . OVER THE COUNTER MEDICATION OTC fiber laxative daily    . predniSONE  (DELTASONE) 20 MG tablet 3 tabs po daily x 3 days, then 2 tabs x 3 days, then 1.5 tabs x 3 days, then 1 tab x 3 days, then 0.5 tabs x 3 days 27 tablet 0  . SYRINGE-NEEDLE, DISP, 3 ML 25G X 5/8" 3 ML MISC Use as directed to inject B12 monthly 100 each 0  . valsartan (DIOVAN) 320 MG tablet 1/2-1 tablet daily 30 tablet 1  . vitamin C (ASCORBIC ACID) 500 MG tablet Take 500 mg by mouth daily.     No current facility-administered medications on file prior to visit.     ROS: all negative except above.   Physical Exam: There were no vitals filed for this visit. BP (!) 158/100   Pulse (!) 102   Temp 98.2 F (36.8 C) (Temporal)   Resp 18   Ht _0  (1.549 m)   SpO2 95%  General Appearance: Well developed well nourished, non-toxic appearing in no apparent distress. Eyes: PERRLA, EOMs, conjunctiva w/ no swelling or erythema or discharge Sinuses: No Frontal/maxillary tenderness ENT/Mouth: Ear canals clear without swelling or erythema.  TM's normal bilaterally with no retractions, bulging, or loss of landmarks.   Neck: Supple, thyroid normal, no notable JVD  Respiratory: Respiratory effort normal, universal wheeze with no crackles, rhonchi or retractions.  Cardio: RRR with no 2/6 murmur heard best on LSB no RGs.   Abdomen: Soft, + BS.  Non tender, no guarding, rebound, hernias, masses.  Musculoskeletal: Full ROM, 5/5 strength, normal gait.  Skin: Warm, dry without rashes  Neuro: Awake and oriented X 3, Cranial nerves intact. Normal muscle tone, no cerebellar symptoms. Sensation intact.  Psych: normal affect, Insight and Judgment appropriate.     Starlyn Skeans, PA-C 10:31 AM [facility]

## 2016-07-16 NOTE — Patient Instructions (Addendum)
Please finish the prednisone medication that you have at home.  Please use the stiolto inhaler, 1 puff twice daily.  Open the box, twist the inhaler like you are wringing a towel until you hear it click, open the cap like a zippo lighter, and then put in your mouth and press the button and slowly inhale the medication.  Use this first thing in the morning and right before bedtime.  Please continue to use your albuterol nebulizer every 6 hours to help keep your lungs open.  Please use the pulmicort in your nebulizer twice per day.  You will want to do this separately from your albuterol.  This can be done 2 hours after your albuterol nebulizer in the morning and the evening.    Please pick up some magnesium from the store and take 500 mg twice per day to help relax your lungs.  Please continue to use the nasal spray twice daily.  You can continue to take the cough tablets up to 3 times per day.

## 2016-09-12 DIAGNOSIS — Z0100 Encounter for examination of eyes and vision without abnormal findings: Secondary | ICD-10-CM | POA: Diagnosis not present

## 2016-09-12 DIAGNOSIS — E119 Type 2 diabetes mellitus without complications: Secondary | ICD-10-CM | POA: Diagnosis not present

## 2016-09-12 DIAGNOSIS — Z961 Presence of intraocular lens: Secondary | ICD-10-CM | POA: Diagnosis not present

## 2016-09-13 ENCOUNTER — Other Ambulatory Visit: Payer: Self-pay | Admitting: Physician Assistant

## 2016-09-13 NOTE — Telephone Encounter (Signed)
Manuela Neptune was called into [employer]

## 2016-09-27 ENCOUNTER — Encounter (HOSPITAL_COMMUNITY): Payer: Self-pay

## 2016-09-27 ENCOUNTER — Emergency Department (HOSPITAL_COMMUNITY)
Admission: EM | Admit: 2016-09-27 | Discharge: 2016-09-27 | Disposition: A | Discharge status: Home / Self Care | Payer: Medicare Other | Attending: Emergency Medicine | Admitting: Emergency Medicine

## 2016-09-27 DIAGNOSIS — Z7984 Long term (current) use of oral hypoglycemic drugs: Secondary | ICD-10-CM | POA: Insufficient documentation

## 2016-09-27 DIAGNOSIS — Z7982 Long term (current) use of aspirin: Secondary | ICD-10-CM | POA: Insufficient documentation

## 2016-09-27 DIAGNOSIS — I10 Essential (primary) hypertension: Secondary | ICD-10-CM | POA: Insufficient documentation

## 2016-09-27 DIAGNOSIS — E119 Type 2 diabetes mellitus without complications: Secondary | ICD-10-CM | POA: Diagnosis not present

## 2016-09-27 DIAGNOSIS — M545 Low back pain, unspecified: Secondary | ICD-10-CM

## 2016-09-27 DIAGNOSIS — Z87891 Personal history of nicotine dependence: Secondary | ICD-10-CM | POA: Insufficient documentation

## 2016-09-27 DIAGNOSIS — Y999 Unspecified external cause status: Secondary | ICD-10-CM | POA: Insufficient documentation

## 2016-09-27 DIAGNOSIS — Y92410 Unspecified street and highway as the place of occurrence of the external cause: Secondary | ICD-10-CM | POA: Insufficient documentation

## 2016-09-27 DIAGNOSIS — Z85038 Personal history of other malignant neoplasm of large intestine: Secondary | ICD-10-CM | POA: Insufficient documentation
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
## 2016-09-27 NOTE — Discharge Instructions (Signed)
Take anti-inflammatory medicine (Ibuprofen) for the next week. Take this medicine with food. Take muscle relaxer at bedtime to help you sleep. This medicine makes you drowsy so do not take before driving or work Use a heating pad for sore muscles - use for 20 minutes several times a day

## 2016-09-28 ENCOUNTER — Encounter: Payer: Self-pay | Admitting: *Deleted

## 2016-10-01 ENCOUNTER — Encounter: Payer: Self-pay | Admitting: Internal Medicine

## 2016-10-01 ENCOUNTER — Ambulatory Visit (INDEPENDENT_AMBULATORY_CARE_PROVIDER_SITE_OTHER): Payer: Medicare Other | Admitting: Internal Medicine

## 2016-10-01 VITALS — BP 146/90 | HR 88 | Temp 98.0°F | Resp 16 | Ht 61.0 in | Wt 145.0 lb

## 2016-10-01 DIAGNOSIS — M62830 Muscle spasm of back: Secondary | ICD-10-CM | POA: Diagnosis not present

## 2016-10-01 DIAGNOSIS — M62838 Other muscle spasm: Secondary | ICD-10-CM | POA: Diagnosis not present
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
## 2016-10-02 DIAGNOSIS — L03213 Periorbital cellulitis: Secondary | ICD-10-CM | POA: Diagnosis not present

## 2016-10-15 DIAGNOSIS — L03213 Periorbital cellulitis: Secondary | ICD-10-CM | POA: Diagnosis not present

## 2016-10-22 ENCOUNTER — Ambulatory Visit (INDEPENDENT_AMBULATORY_CARE_PROVIDER_SITE_OTHER): Payer: Medicare Other | Admitting: Physician Assistant

## 2016-10-22 ENCOUNTER — Encounter: Payer: Self-pay | Admitting: Physician Assistant

## 2016-10-22 VITALS — BP 140/86 | HR 71 | Temp 97.7°F | Resp 14 | Ht 60.0 in | Wt 145.6 lb

## 2016-10-22 DIAGNOSIS — D509 Iron deficiency anemia, unspecified: Secondary | ICD-10-CM | POA: Diagnosis not present

## 2016-10-22 DIAGNOSIS — E538 Deficiency of other specified B group vitamins: Secondary | ICD-10-CM

## 2016-10-22 DIAGNOSIS — J45909 Unspecified asthma, uncomplicated: Secondary | ICD-10-CM

## 2016-10-22 DIAGNOSIS — F325 Major depressive disorder, single episode, in full remission: Secondary | ICD-10-CM

## 2016-10-22 DIAGNOSIS — Z79899 Other long term (current) drug therapy: Secondary | ICD-10-CM

## 2016-10-22 DIAGNOSIS — E785 Hyperlipidemia, unspecified: Secondary | ICD-10-CM | POA: Diagnosis not present

## 2016-10-22 DIAGNOSIS — E1149 Type 2 diabetes mellitus with other diabetic neurological complication: Secondary | ICD-10-CM

## 2016-10-22 DIAGNOSIS — E559 Vitamin D deficiency, unspecified: Secondary | ICD-10-CM

## 2016-10-22 DIAGNOSIS — I10 Essential (primary) hypertension: Secondary | ICD-10-CM

## 2016-10-22 LAB — CBC WITH DIFFERENTIAL/PLATELET
BASOS ABS: 0 {cells}/uL (ref 0–200)
Basophils Relative: 0 %
EOS PCT: 4 %
Eosinophils Absolute: 224 cells/uL (ref 15–500)
HCT: 41 % (ref 35.0–45.0)
HEMOGLOBIN: 13.3 g/dL (ref 11.7–15.5)
LYMPHS ABS: 2016 {cells}/uL (ref 850–3900)
Lymphocytes Relative: 36 %
MCH: 27.1 pg (ref 27.0–33.0)
MCHC: 32.4 g/dL (ref 32.0–36.0)
MCV: 83.5 fL (ref 80.0–100.0)
MONOS PCT: 12 %
MPV: 10 fL (ref 7.5–12.5)
Monocytes Absolute: 672 cells/uL (ref 200–950)
NEUTROS PCT: 48 %
Neutro Abs: 2688 cells/uL (ref 1500–7800)
Platelets: 319 10*3/uL (ref 140–400)
RBC: 4.91 MIL/uL (ref 3.80–5.10)
RDW: 13.8 % (ref 11.0–15.0)
WBC: 5.6 10*3/uL (ref 3.8–10.8)

## 2016-10-22 LAB — BASIC METABOLIC PANEL WITH GFR
BUN: 10 mg/dL (ref 7–25)
CALCIUM: 9.6 mg/dL (ref 8.6–10.4)
CO2: 33 mmol/L — AB (ref 20–31)
CREATININE: 0.83 mg/dL (ref 0.60–0.93)
Chloride: 99 mmol/L (ref 98–110)
GFR, EST AFRICAN AMERICAN: 83 mL/min (ref >=60)
GFR, EST NON AFRICAN AMERICAN: 72 mL/min (ref >=60)
Glucose, Bld: 125 mg/dL — ABNORMAL HIGH (ref 65–99)
Potassium: 4.6 mmol/L (ref 3.5–5.3)
SODIUM: 139 mmol/L (ref 135–146)

## 2016-10-22 LAB — HEPATIC FUNCTION PANEL
ALT: 11 U/L (ref 6–29)
AST: 20 U/L (ref 10–35)
Albumin: 4 g/dL (ref 3.6–5.1)
Alkaline Phosphatase: 119 U/L (ref 33–130)
BILIRUBIN DIRECT: 0.1 mg/dL (ref <=0.2)
BILIRUBIN INDIRECT: 0.5 mg/dL (ref 0.2–1.2)
Total Bilirubin: 0.6 mg/dL (ref 0.2–1.2)
Total Protein: 7.4 g/dL (ref 6.1–8.1)

## 2016-10-22 LAB — LIPID PANEL
CHOL/HDL RATIO: 3.9 ratio (ref <5.0)
CHOLESTEROL: 176 mg/dL (ref <200)
HDL: 45 mg/dL — ABNORMAL LOW (ref >50)
LDL Cholesterol: 89 mg/dL (ref <100)
Triglycerides: 212 mg/dL — ABNORMAL HIGH (ref <150)
VLDL: 42 mg/dL — AB (ref <30)

## 2016-10-22 LAB — IRON AND TIBC
%SAT: 22 % (ref 11–50)
IRON: 71 ug/dL (ref 45–160)
TIBC: 325 ug/dL (ref 250–450)
UIBC: 254 ug/dL (ref 125–400)

## 2016-10-22 LAB — TSH: TSH: 1.3 m[IU]/L

## 2016-10-22 MED ORDER — AMLODIPINE BESYLATE 5 MG PO TABS: 5.0000 mg | ORAL_TABLET | Freq: Every day | ORAL | 3 refills | Status: DC

## 2016-10-22 MED ORDER — METOPROLOL SUCCINATE ER 100 MG PO TB24: 100.0000 mg | ORAL_TABLET | Freq: Every day | ORAL | 2 refills | Status: DC
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
## 2016-10-22 NOTE — Patient Instructions (Addendum)
Heart rate is low, we will decrease to 100mg  of the toprol to help increase heart rate and decrease fatigue/dizziness  For 1 week do  Toprol 1/2 a pill at night and 1/4 a pill in the morning  then go to 1/2 pill a day or the 100mg  daily of the medication.   Please monitor your BP, we will start you on norvasc/amlodipine 5mg , start 1/2 daily and then can increase to 1 pill a day if your BP is above 140/90.   Monitor your blood pressure at home. Go to the ER if any CP, SOB, nausea, dizziness, severe HA, changes vision/speech  Goal BP:  For patients younger than 60: Goal BP < 140/90. For patients 60 and older: Goal BP < 150/90. For patients with diabetes: Goal BP < 140/90. Your most recent BP: BP: 140/86   Take your medications faithfully as instructed. Maintain a healthy weight. Get at least 150 minutes of aerobic exercise per week. Minimize salt intake. Minimize alcohol intake  DASH Eating Plan DASH stands for "Dietary Approaches to Stop Hypertension." The DASH eating plan is a healthy eating plan that has been shown to reduce high blood pressure (hypertension). Additional health benefits may include reducing the risk of type 2 diabetes mellitus, heart disease, and stroke. The DASH eating plan may also help with weight loss. WHAT DO I NEED TO KNOW ABOUT THE DASH EATING PLAN? For the DASH eating plan, you will follow these general guidelines:  Choose foods with a percent daily value for sodium of less than 5% (as listed on the food label).  Use salt-free seasonings or herbs instead of table salt or sea salt.  Check with your health care provider or pharmacist before using salt substitutes.  Eat lower-sodium products, often labeled as "lower sodium" or "no salt added."  Eat fresh foods.  Eat more vegetables, fruits, and low-fat dairy products.  Choose whole grains. Look for the word "whole" as the first word in the ingredient list.  Choose fish and skinless chicken or [place] more  often than red meat. Limit fish, poultry, and meat to 6 oz (170 g) each day.  Limit sweets, desserts, sugars, and sugary drinks.  Choose heart-healthy fats.  Limit cheese to 1 oz (28 g) per day.  Eat more home-cooked food and less restaurant, buffet, and fast food.  Limit fried foods.  Cook foods using methods other than frying.  Limit canned vegetables. If you do use them, rinse them well to decrease the sodium.  When eating at a restaurant, ask that your food be prepared with less salt, or no salt if possible. WHAT FOODS CAN I EAT? Seek help from a dietitian for individual calorie needs. Grains Whole grain or whole wheat bread. Brown rice. Whole grain or whole wheat pasta. Quinoa, bulgur, and whole grain cereals. Low-sodium cereals. Corn or whole wheat flour tortillas. Whole grain cornbread. Whole grain crackers. Low-sodium crackers. Vegetables Fresh or frozen vegetables (raw, steamed, roasted, or grilled). Low-sodium or reduced-sodium tomato and vegetable juices. Low-sodium or reduced-sodium tomato sauce and paste. Low-sodium or reduced-sodium canned vegetables.  Fruits All fresh, canned (in natural juice), or frozen fruits. Meat and Other Protein Products Ground beef (85% or leaner), grass-fed beef, or beef trimmed of fat. Skinless chicken or [place]. Ground chicken or [place]. Pork trimmed of fat. All fish and seafood. Eggs. Dried beans, peas, or lentils. Unsalted nuts and seeds. Unsalted canned beans. Dairy Low-fat dairy products, such as skim or 1% milk, 2% or reduced-fat cheeses, low-fat  ricotta or cottage cheese, or plain low-fat yogurt. Low-sodium or reduced-sodium cheeses. Fats and Oils Tub margarines without trans fats. Light or reduced-fat mayonnaise and salad dressings (reduced sodium). Avocado. Safflower, olive, or canola oils. Natural peanut or almond butter. Other Unsalted popcorn and pretzels. The items listed above may not be a complete list of recommended foods or  beverages. Contact your dietitian for more options. WHAT FOODS ARE NOT RECOMMENDED? Grains White bread. White pasta. White rice. Refined cornbread. Bagels and croissants. Crackers that contain trans fat. Vegetables Creamed or fried vegetables. Vegetables in a cheese sauce. Regular canned vegetables. Regular canned tomato sauce and paste. Regular tomato and vegetable juices. Fruits Dried fruits. Canned fruit in light or heavy syrup. Fruit juice. Meat and Other Protein Products Fatty cuts of meat. Ribs, chicken wings, bacon, sausage, bologna, salami, chitterlings, fatback, hot dogs, bratwurst, and packaged luncheon meats. Salted nuts and seeds. Canned beans with salt. Dairy Whole or 2% milk, cream, half-and-half, and cream cheese. Whole-fat or sweetened yogurt. Full-fat cheeses or blue cheese. Nondairy creamers and whipped toppings. Processed cheese, cheese spreads, or cheese curds. Condiments Onion and garlic salt, seasoned salt, table salt, and sea salt. Canned and packaged gravies. Worcestershire sauce. Tartar sauce. Barbecue sauce. Teriyaki sauce. Soy sauce, including reduced sodium. Steak sauce. Fish sauce. Oyster sauce. Cocktail sauce. Horseradish. Ketchup and mustard. Meat flavorings and tenderizers. Bouillon cubes. Hot sauce. Tabasco sauce. Marinades. Taco seasonings. Relishes. Fats and Oils Butter, stick margarine, lard, shortening, ghee, and bacon fat. Coconut, palm kernel, or palm oils. Regular salad dressings. Other Pickles and olives. Salted popcorn and pretzels. The items listed above may not be a complete list of foods and beverages to avoid. Contact your dietitian for more information. WHERE CAN I FIND MORE INFORMATION? National Heart, Lung, and Blood Institute: travelstabloid.com Document Released: 08/29/2011 Document Revised: 01/24/2014 Document Reviewed: 07/14/2013 [facility] Patient Information 2015 [place], [place]. This information is not  intended to replace advice given to you by your health care provider. Make sure you discuss any questions you have with your health care provider.      Bad carbs also include fruit juice, alcohol, and sweet tea. These are empty calories that do not signal to your brain that you are full.   Please remember the good carbs are still carbs which convert into sugar. So please measure them out no more than 1/2-1 cup of rice, oatmeal, pasta, and beans  Veggies are however free foods! Pile them on.   Not all fruit is created equal. Please see the list below, the fruit at the bottom is higher in sugars than the fruit at the top. Please avoid all dried fruits.     Your A1C is a measure of your sugar over the past 3 months and is not affected by what you have eaten over the past few days. Diabetes increases your chances of stroke and heart attack over 300 % and is the leading cause of blindness and kidney failure in the [place]. Please make sure you decrease bad carbs like white bread, white rice, potatoes, corn, soft drinks, pasta, cereals, refined sugars, sweet tea, dried fruits, and fruit juice. Good carbs are okay to eat in moderation like sweet potatoes, brown rice, whole grain pasta/bread, most fruit (except dried fruit) and you can eat as many veggies as you want.   Greater than 6.5 is considered diabetic. Between 6.4 and 5.7 is prediabetic If your A1C is less than 5.7 you are NOT diabetic.  Targets for Glucose Readings:  Time of Check Target for patients WITHOUT Diabetes Target for DIABETICS  Before Meals Less than 100  less than 150  Two hours after meals Less than 200  Less than 250    Simple math prevails.    1st - exercise does not produce significant weight loss - at best one converts fat into muscle , "bulks up", loses inches, but usually stays "weight neutral"     2nd - think of your body weightas a check book: If you eat more calories than you burn up - you save money or gain  weight .... Or if you spend more money than you put in the check book, ie burn up more calories than you eat, then you lose weight     3rd - if you walk or run 1 mile, you burn up 100 calories - you have to burn up 3,500 calories to lose 1 pound, ie you have to walk/run 35 miles to lose 1 measly pound. So if you want to lose 10 #, then you have to walk/run 350 miles, so.... clearly exercise is not the solution.     4. So if you consume 1,500 calories, then you have to burn up the equivalent of 15 miles to stay weight neutral - It also stands to reason that if you consume 1,500 cal/day and don't lose weight, then you must be burning up about 1,500 cals/day to stay weight neutral.     5. If you really want to lose weight, you must cut your calorie intake 300 calories /day and at that rate you should lose about 1 # every 3 days.   6. Please purchase Dr Fara Olden Fuhrman's book(s) "The End of Dieting" & "Eat to Live" . It has some great concepts and recipes.

## 2016-10-23 LAB — URINALYSIS, ROUTINE W REFLEX MICROSCOPIC
Bilirubin Urine: NEGATIVE
Glucose, UA: NEGATIVE
Hgb urine dipstick: NEGATIVE
Ketones, ur: NEGATIVE
Leukocytes, UA: NEGATIVE
NITRITE: NEGATIVE
Protein, ur: NEGATIVE
SPECIFIC GRAVITY, URINE: 1.012 (ref 1.001–1.035)
pH: 7.5 (ref 5.0–8.0)

## 2016-10-23 LAB — MICROALBUMIN / CREATININE URINE RATIO
CREATININE, URINE: 79 mg/dL (ref 20–320)
MICROALB UR: 1.4 mg/dL
MICROALB/CREAT RATIO: 18 ug/mg{creat} (ref <30)

## 2016-10-23 LAB — HEMOGLOBIN A1C
Hgb A1c MFr Bld: 6.9 % — ABNORMAL HIGH (ref <5.7)
MEAN PLASMA GLUCOSE: 151 mg/dL

## 2016-10-23 NOTE — Progress Notes (Signed)
Pt aware of lab results & voiced understanding of those results.

## 2016-11-13 ENCOUNTER — Telehealth: Payer: Self-pay | Admitting: *Deleted

## 2016-11-13 ENCOUNTER — Encounter (INDEPENDENT_AMBULATORY_CARE_PROVIDER_SITE_OTHER): Payer: Self-pay

## 2016-11-13 ENCOUNTER — Encounter: Payer: Self-pay | Admitting: Allergy and Immunology

## 2016-11-13 ENCOUNTER — Ambulatory Visit (INDEPENDENT_AMBULATORY_CARE_PROVIDER_SITE_OTHER): Payer: Medicare Other | Admitting: Allergy and Immunology

## 2016-11-13 VITALS — BP 140/92 | HR 68 | Resp 16 | Ht 59.45 in | Wt 145.8 lb

## 2016-11-13 DIAGNOSIS — L989 Disorder of the skin and subcutaneous tissue, unspecified: Secondary | ICD-10-CM

## 2016-11-13 DIAGNOSIS — J4540 Moderate persistent asthma, uncomplicated: Secondary | ICD-10-CM

## 2016-11-13 DIAGNOSIS — J3089 Other allergic rhinitis: Secondary | ICD-10-CM | POA: Diagnosis not present

## 2016-11-13 DIAGNOSIS — L308 Other specified dermatitis: Secondary | ICD-10-CM

## 2016-11-13 MED ORDER — MOMETASONE FUROATE 0.1 % EX OINT: TOPICAL_OINTMENT | CUTANEOUS | 3 refills | Status: AC

## 2016-11-13 MED ORDER — BUDESONIDE-FORMOTEROL FUMARATE 160-4.5 MCG/ACT IN AERO: INHALATION_SPRAY | RESPIRATORY_TRACT | 5 refills | Status: DC

## 2016-11-13 MED ORDER — FLUTICASONE-SALMETEROL 115-21 MCG/ACT IN AERO: 2.0000 | INHALATION_SPRAY | Freq: Two times a day (BID) | RESPIRATORY_TRACT | 5 refills | Status: AC

## 2016-11-13 NOTE — Telephone Encounter (Signed)
Insurance will not cover Symbicort. Insurance prefers Advair diskus or HFA. Please advise.

## 2016-11-13 NOTE — Progress Notes (Signed)
Dear Dr. Melford Aase,  Thank you for referring Nancy Myers to the [facility] [place] on 11/13/2016.   Below is a summation of this patient's evaluation and recommendations.  Thank you for your referral. I will keep you informed about this patient's response to treatment.   If you have any questions please do not hesitate to contact me.   Sincerely,  Jiles Prows, MD Allergy / Immunology [place] [facility]   ______________________________________________________________________    NEW PATIENT NOTE  Referring Provider: Unk Pinto, MD Primary Provider: Alesia Richards, MD Date of office visit: 11/13/2016    Subjective:   Chief Complaint:  Nancy Myers (DOB: 1945-10-29) is a 71 y.o. female who presents to the clinic on 11/13/2016 with a chief complaint of Angioedema (Swelling around eyes.) .     HPI: Nancy Myers presents to this clinic in evaluation of swelling reactions. Since December 2017 she's been having swelling around her eyes. Most of the swelling appears to be confined to her upper eyelid. She will develop very red itchy areas with lid swelling and then she'll go through a cycle of scale production. She's never really had return of her eyelids to normal since December but they oscillate between slightly swollen to very swollen. She has really had no associated systemic or constitutional symptoms associated with this issue. She did see her ophthalmologist who gave her doxycycline at the beginning of January which did not help her. She has noticed that she has developed a rash on her left hand over the course of the past several weeks.  Concerning possible triggers for this issue, she does use a "skin tightening" cream on her face over the course of the past 6 months or so on a daily basis. She has not really had a significant manipulation of her medications to explain this issue.  She has not had any unusual environmental exposures to explain this issue.   In addition, she carries the diagnosis of asthma. She tells me that she coughs all the time. Her daughter tells her that she needs to clear her throat because she gets intermittently raspy with a cough. She's been given multiple inhalers in the past and she's not really sure that the inhalers have helped her very much regarding this issue. She has had evaluation by Dr. Keturah Barre for this issue. She does not have any significant classic GI symptoms. There is no obvious provoking factor giving rise to this issue. She did have an episode of "bronchitis" at the beginning of December requiring the administration of an antibiotic and prednisone. This was apparently 2 weeks prior to the onset of her dermatitis affecting her eyelids. She did smoke from [phone] at 1 pack per day.  She also has a history of allergies with intermittent nasal congestion and sneezing but without any anosmia or history of ugly nasal discharge or significant headaches. She uses a nasal steroid about twice a week. There is no obvious provoking factor giving rise to this issue.  Past Medical History:  Diagnosis Date  . Anemia   . Anxiety   . Arrhythmia   . Cancer [facility]) 1995   Colon Cancer  . Depression   . Diabetes mellitus without complication ([place])   . GERD (gastroesophageal reflux disease)   . Hyperlipidemia   . Hypertension   . IBS (irritable bowel syndrome)   . Vitamin D deficiency     Past Surgical History:  Procedure Laterality Date  . ANTERIOR CRUCIATE LIGAMENT REPAIR Right 04-09-04   right knee  . BUNIONECTOMY Bilateral 02/1987  . BUNIONECTOMY Right 03-08-08   right foot  . CARPAL TUNNEL RELEASE Bilateral 09-30-01;10-21-01  . CATARACT EXTRACTION W/ INTRAOCULAR LENS  IMPLANT, BILATERAL Bilateral 12-07-09;02-15-10  . left foot  05-23-05  . left knee  07-26-02  . SHOULDER SURGERY Left     Allergies as of 11/13/2016      Reactions    Codeine Itching      Medication List      acyclovir 400 MG tablet Commonly known as:  ZOVIRAX Take 1 pill two times daily as needed   ADVAIR DISKUS 100-50 MCG/DOSE Aepb Generic drug:  Fluticasone-Salmeterol Inhale 1 puff into the lungs 2 (two) times daily.   amLODipine 5 MG tablet Commonly known as:  NORVASC Take 1 tablet (5 mg total) by mouth daily.   aspirin 81 MG chewable tablet Chew 162 mg by mouth daily.   atorvastatin 40 MG tablet Commonly known as:  LIPITOR Take 1 tablet (40 mg total) by mouth daily.   BUDESONIDE IN Take by nebulization as needed.   carisoprodol 250 MG tablet Commonly known as:  SOMA TAKE 1 TABLET BY MOUTH EVERY 6 HOURS AS NEEDED FOR PAIN   cyanocobalamin 1000 MCG/ML injection Commonly known as:  (VITAMIN B-12) INJECT 1 ML (1000 MCG TOTAL) INTRAMUSCULARLY EVERY 30 DAYS   doxycycline 100 MG tablet Commonly known as:  VIBRA-TABS   FLUoxetine 40 MG capsule Commonly known as:  PROZAC Take 1 capsule (40 mg total) by mouth daily.   fluticasone 50 MCG/ACT nasal spray Commonly known as:  FLONASE Place 1 spray into both nostrils daily as needed for allergies or rhinitis.   FREESTYLE FREEDOM LITE w/Device Kit Check Blood Sugar One Time Daily   freestyle lancets Test Blood Sugar One Time Daily   glucose blood test strip Test Blood Sugar One Time Daily   hydrochlorothiazide 25 MG tablet Commonly known as:  HYDRODIURIL Take 1 tablet (25 mg total) by mouth daily.   ipratropium 0.02 % nebulizer solution Commonly known as:  ATROVENT Take 0.5 mg by nebulization every 4 (four) hours as needed for wheezing or shortness of breath.   losartan 100 MG tablet Commonly known as:  COZAAR Take 100 mg by mouth daily.   Magnesium 500 MG Tabs Take by mouth.   metFORMIN 500 MG tablet Commonly known as:  GLUCOPHAGE Take 2 tablets (1,000 mg total) by mouth daily with breakfast.   metoprolol succinate 100 MG 24 hr tablet Commonly known as:   TOPROL-XL Take 1 tablet (100 mg total) by mouth daily.   PROAIR HFA 108 (90 Base) MCG/ACT inhaler Generic drug:  albuterol Inhale two puffs every four to six hours as needed for cough or wheeze.   RHINOCORT ALLERGY 32 MCG/ACT nasal spray Generic drug:  budesonide Place 1 spray into both nostrils daily as needed for rhinitis.   valsartan 320 MG tablet Commonly known as:  DIOVAN 1/2-1 tablet daily   vitamin C 500 MG tablet Commonly known as:  ASCORBIC ACID Take 500 mg by mouth daily.   VITAMIN D PO Take 5,000 Units by mouth.       Review of systems negative except as noted in HPI / PMHx or noted below:  Review of Systems  Constitutional: Negative.   HENT: Negative.   Eyes: Negative.   Respiratory: Negative.   Cardiovascular: Negative.   Gastrointestinal: Negative.   Genitourinary: Negative.   Musculoskeletal:  Negative.   Skin: Negative.   Neurological: Negative.   Endo/Heme/Allergies: Negative.   Psychiatric/Behavioral: Negative.     Family History  Problem Relation Age of Onset  . Heart disease Father     MI  . Stroke Mother   . Rheum arthritis Mother   . Heart attack Brother     half brother  . Heart attack Maternal Uncle     half maternal uncle    Social History   Social History  . Marital status: Married    Spouse name: N/A  . Number of children: N/A  . Years of education: N/A   Occupational History  . 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 Smoking status: Former Smoker    Types: Cigarettes    Quit date: 09/23/1986  . Smokeless tobacco: Never Used  . Alcohol use No  . Drug use: No  . Sexual activity: Not on file   Other Topics Concern  . Not on file   Social History Narrative  . No narrative on file    Environmental and Social history  Lives in a house with a dry environment, a dog located inside the household, carpeting in the bedroom, no plastic on the bed or low, and no smoking ongoing inside the  household. She works at a English as a second language teacher.  Objective:   Vitals:   11/13/16 0949  BP: (!) 140/92  Pulse: 68  Resp: 16   Height: 4' 11.45" (151 cm) Weight: 145 lb 12.8 oz (66.1 kg)  Physical Exam  Constitutional: She is well-developed, well-nourished, and in no distress.  Coughing with deep inspiration, raspy voice, throat clearing  HENT:  Head: Normocephalic. Head is without right periorbital erythema and without left periorbital erythema.  Right Ear: Tympanic membrane, external ear and ear canal normal.  Left Ear: Tympanic membrane, external ear and ear canal normal.  Nose: Nose normal. No mucosal edema or rhinorrhea.  Mouth/Throat: Uvula is midline, oropharynx is clear and moist and mucous membranes are normal. No oropharyngeal exudate.  Eyes: Conjunctivae and lids are normal. Pupils are equal, round, and reactive to light.  Neck: Trachea normal. No tracheal tenderness present. No tracheal deviation present. No thyromegaly present.  Cardiovascular: Normal rate, regular rhythm, S1 normal, S2 normal and normal heart sounds.   No murmur heard. Pulmonary/Chest: Effort normal and breath sounds normal. No stridor. No tachypnea. No respiratory distress. She has no wheezes. She has no rales. She exhibits no tenderness.  Abdominal: Soft. She exhibits no distension and no mass. There is no hepatosplenomegaly. There is no tenderness. There is no rebound and no guarding.  Musculoskeletal: She exhibits no edema or tenderness.  Lymphadenopathy:       Head (right side): No tonsillar adenopathy present.       Head (left side): No tonsillar adenopathy present.    She has no cervical adenopathy.    She has no axillary adenopathy.  Neurological: She is alert. Gait normal.  Skin: Rash (upper eyelid erythema, induration, and scale bilaterally. 2 cm diameter circular erythematous scaly patch affecting left dorsal hand) noted. She is not diaphoretic. No erythema. No pallor. Nails show no clubbing.   Psychiatric: Mood and affect normal.    Diagnostics: Allergy skin tests were performed. She did not demonstrate any hypersensitivity against a screening panel of aeroallergens or foods.  Spirometry was performed and demonstrated an FEV1 of 1.02 @ 47 % of predicted.  Results of blood tests obtained on 10/22/2016 identified normal hepatic and renal  function, white blood cell count 5.6 with a normal differential, hemoglobin 13.3, platelet count 319. Her absolute eosinophil count was 224. Her TSH was 1.30 micro-international units/L.  Results of a chest x-ray obtained 08/02/2013 identify the following:  Mid thoracic spondylosis which is moderate. Midline trachea. Normal heart size. Mildly tortuous descending thoracic aorta. No pleural effusion or pneumothorax. Clear lungs.   Assessment and Plan:    1. Inflammatory dermatosis   2. Asthma, moderate persistent, well-controlled   3. Other allergic rhinitis     1. Allergen avoidance measures  2. Treat and prevent inflammation:   A. mometasone 0.1% ointment applied to inflamed skin one time per day  B. Symbicort 160 - 2 inhalations twice a day with spacer  C. Flonase - one spray each nostril one time per day  3. Every day take cetirizine 10 mg tablet 1 time per day  4. If needed:   A. Proventil HFA or similar 2 puffs every 4-6 hours  5. LPR?  6. Return to clinic in 2 weeks or earlier if problem  Ritamarie will use a combination of anti-inflammatory agents to address both her cutaneous inflammation and her respiratory inflammation as noted above. At this point I will not have her undergo any further evaluation for the etiologic agent responsible for her inflammatory dermatosis. It should be noted that a lot of her respiratory history is very consistent with laryngopharyngeal reflux and we may need to address this issue if she does not respond well to medical therapy noted above. I'll see her back in this clinic in 2 weeks or earlier  if there is a problem.  Jiles Prows, MD [facility]

## 2016-11-13 NOTE — Telephone Encounter (Signed)
Please give her Advair 115 two inhalations two times per day with spacer after she finishes the Symbicort sample. Do not use the Advair Diskus.

## 2016-11-13 NOTE — Telephone Encounter (Signed)
Sent in script into patients pharmacy.

## 2016-11-13 NOTE — Patient Instructions (Addendum)
  1. Allergen avoidance measures  2. Treat and prevent inflammation:   A. mometasone 0.1% ointment applied to inflamed skin one time per day  B. Symbicort 160 - 2 inhalations twice a day with spacer  C. Flonase - one spray each nostril one time per day  3. Every day take cetirizine 10 mg tablet 1 time per day  4. If needed:   A. Proventil HFA or similar 2 puffs every 4-6 hours  5. LPR?  6. Return to clinic in 2 weeks or earlier if problem

## 2016-11-19 DIAGNOSIS — M542 Cervicalgia: Secondary | ICD-10-CM | POA: Diagnosis not present

## 2016-11-19 DIAGNOSIS — M4312 Spondylolisthesis, cervical region: Secondary | ICD-10-CM | POA: Diagnosis not present

## 2016-11-19 DIAGNOSIS — M5030 Other cervical disc degeneration, unspecified cervical region: Secondary | ICD-10-CM | POA: Diagnosis not present

## 2016-11-19 DIAGNOSIS — M47812 Spondylosis without myelopathy or radiculopathy, cervical region: Secondary | ICD-10-CM | POA: Diagnosis not present

## 2016-11-22 ENCOUNTER — Encounter: Payer: Self-pay | Admitting: Physician Assistant

## 2016-11-22 ENCOUNTER — Ambulatory Visit (INDEPENDENT_AMBULATORY_CARE_PROVIDER_SITE_OTHER): Payer: Medicare Other | Admitting: Physician Assistant

## 2016-11-22 ENCOUNTER — Telehealth: Payer: Self-pay | Admitting: Allergy and Immunology

## 2016-11-22 VITALS — BP 132/76 | HR 84 | Temp 97.3°F | Resp 14 | Ht 59.45 in | Wt 147.8 lb

## 2016-11-22 DIAGNOSIS — I10 Essential (primary) hypertension: Secondary | ICD-10-CM | POA: Diagnosis not present

## 2016-11-22 NOTE — Telephone Encounter (Signed)
champ[facility] called and said that she does not have her as pateint to receive rx , that it must be a local pharmacy, and the rx is advair. [phone] refer [phone]:[id number].

## 2016-11-22 NOTE — Progress Notes (Signed)
Assessment and Plan: Hypertension- bring all meds next OV- BP controlled and HR rate, continue meds the same, follow up  Future Appointments Date Time Provider [place]  12/10/2016 9:45 AM Jiles Prows, MD [facility] None  10/23/2017 10:00 AM Vicie Mutters, PA-C [facility] None    HPI 71 y.o.female presents for follow up for 1 month. Last visit she had sinus bradycardia, her toprol was decreased to 146m daily and norvasc was added.  She has seen an allergist and started on flonase, symbicort, needs new spacer.   Blood pressure 132/76, pulse 84, temperature 97.3 F (36.3 C), resp. rate 14, height 4' 11.45" (1.51 m), weight 147 lb 12.8 oz (67 kg), SpO2 95 %.   Past Medical History:  Diagnosis Date  . Anemia   . Anxiety   . Arrhythmia   . Cancer ([facility] 1995   Colon Cancer  . Depression   . Diabetes mellitus without complication ([place]   . GERD (gastroesophageal reflux disease)   . Hyperlipidemia   . Hypertension   . IBS (irritable bowel syndrome)   . Vitamin D deficiency      Allergies  Allergen Reactions  . Codeine Itching    Current Outpatient Prescriptions on File Prior to Visit  Medication Sig  . acyclovir (ZOVIRAX) 400 MG tablet Take 1 pill two times daily as needed  . albuterol (PROAIR HFA) 108 (90 Base) MCG/ACT inhaler Inhale two puffs every four to six hours as needed for cough or wheeze.  .Marland KitchenamLODipine (NORVASC) 5 MG tablet Take 1 tablet (5 mg total) by mouth daily.  .Marland Kitchenaspirin 81 MG chewable tablet Chew 162 mg by mouth daily.   .Marland Kitchenatorvastatin (LIPITOR) 40 MG tablet Take 1 tablet (40 mg total) by mouth daily.  . Blood Glucose Monitoring Suppl (FREESTYLE FREEDOM LITE) W/DEVICE KIT Check Blood Sugar One Time Daily  . budesonide (RHINOCORT ALLERGY) 32 MCG/ACT nasal spray Place 1 spray into both nostrils daily as needed for rhinitis.  . BUDESONIDE IN Take by nebulization as needed.  . budesonide-formoterol (SYMBICORT) 160-4.5 MCG/ACT inhaler Inhale two puffs twice  a day to prevent cough or wheeze.  Rinse, gargle, and spit after use.  . carisoprodol (SOMA) 250 MG tablet TAKE 1 TABLET BY MOUTH EVERY 6 HOURS AS NEEDED FOR PAIN  . Cholecalciferol (VITAMIN D PO) Take 5,000 Units by mouth.  . cyanocobalamin (,VITAMIN B-12,) 1000 MCG/ML injection INJECT 1 ML (1000 MCG TOTAL) INTRAMUSCULARLY EVERY 30 DAYS  . FLUoxetine (PROZAC) 40 MG capsule Take 1 capsule (40 mg total) by mouth daily.  . fluticasone (FLONASE) 50 MCG/ACT nasal spray Place 1 spray into both nostrils daily as needed for allergies or rhinitis.  . fluticasone-salmeterol (ADVAIR HFA) 115-21 MCG/ACT inhaler Inhale 2 puffs into the lungs 2 (two) times daily.  .Marland Kitchenglucose blood test strip Test Blood Sugar One Time Daily  . hydrochlorothiazide (HYDRODIURIL) 25 MG tablet Take 1 tablet (25 mg total) by mouth daily.  .Marland Kitchenipratropium (ATROVENT) 0.02 % nebulizer solution Take 0.5 mg by nebulization every 4 (four) hours as needed for wheezing or shortness of breath.  . Lancets (FREESTYLE) lancets Test Blood Sugar One Time Daily  . losartan (COZAAR) 100 MG tablet Take 100 mg by mouth daily.  . Magnesium 500 MG TABS Take by mouth.  . metFORMIN (GLUCOPHAGE) 500 MG tablet Take 2 tablets (1,000 mg total) by mouth daily with breakfast.  . metoprolol (TOPROL-XL) 100 MG 24 hr tablet Take 1 tablet (100 mg total) by mouth daily.  .Marland Kitchen  mometasone (ELOCON) 0.1 % ointment Apply to affected skin once daily as directed.  . valsartan (DIOVAN) 320 MG tablet 1/2-1 tablet daily  . vitamin C (ASCORBIC ACID) 500 MG tablet Take 500 mg by mouth daily.   No current facility-administered medications on file prior to visit.     ROS: all negative except above.   Physical Exam: Filed Weights   11/22/16 1029  Weight: 147 lb 12.8 oz (67 kg)   BP 132/76   Pulse 84   Temp 97.3 F (36.3 C)   Resp 14   Ht 4' 11.45" (1.51 m)   Wt 147 lb 12.8 oz (67 kg)   SpO2 95%   BMI 29.40 kg/m  General Appearance: Well nourished, in no apparent  distress. Eyes: PERRLA, EOMs, conjunctiva no swelling or erythema Sinuses: No Frontal/maxillary tenderness ENT/Mouth: Ext aud canals clear, TMs without erythema, bulging. No erythema, swelling, or exudate on post pharynx.  Tonsils not swollen or erythematous. Hearing normal.  Neck: Supple, thyroid normal.  Respiratory: Respiratory effort normal, BS equal bilaterally without rales, rhonchi, wheezing or stridor.  Cardio: RRR with systolic murmur. Brisk peripheral pulses without edema.  Abdomen: Soft, + BS.  Non tender, no guarding, rebound, hernias, masses. Lymphatics: Non tender without lymphadenopathy.  Musculoskeletal: Full ROM, 5/5 strength, normal gait.  Skin: Warm, dry without rashes, lesions, ecchymosis.  Neuro: Cranial nerves intact. Normal muscle tone, no cerebellar symptoms. Sensation intact.  Psych: Awake and oriented X 3, normal affect, Insight and Judgment appropriate.     Vicie Mutters, PA-C 10:48 AM [facility]

## 2016-11-22 NOTE — Telephone Encounter (Signed)
Please disregard patient states she has symbicort 160 samples does not want [place] to send any rx at this time

## 2016-11-22 NOTE — Patient Instructions (Signed)
Take the metoprolol 100mg  can do 1/2 to 1 at night and Diovan/valsartan 320mg  and norvasc/amlodipine 5mg  during the day  Monitor BP and HR  Should be off the losartan  BRING ALL OF YOUR MEDICATIONS WITH YOU NEXT VISIT  Monitor your blood pressure at home. Go to the ER if any CP, SOB, nausea, dizziness, severe HA, changes vision/speech  Goal BP:  For patients younger than 60: Goal BP < 140/90. For patients 60 and older: Goal BP < 150/90. For patients with diabetes: Goal BP < 140/90. Your most recent BP: BP: 132/76   Take your medications faithfully as instructed. Maintain a healthy weight. Get at least 150 minutes of aerobic exercise per week. Minimize salt intake. Minimize alcohol intake  DASH Eating Plan DASH stands for "Dietary Approaches to Stop Hypertension." The DASH eating plan is a healthy eating plan that has been shown to reduce high blood pressure (hypertension). Additional health benefits may include reducing the risk of type 2 diabetes mellitus, heart disease, and stroke. The DASH eating plan may also help with weight loss. WHAT DO I NEED TO KNOW ABOUT THE DASH EATING PLAN? For the DASH eating plan, you will follow these general guidelines:  Choose foods with a percent daily value for sodium of less than 5% (as listed on the food label).  Use salt-free seasonings or herbs instead of table salt or sea salt.  Check with your health care provider or pharmacist before using salt substitutes.  Eat lower-sodium products, often labeled as "lower sodium" or "no salt added."  Eat fresh foods.  Eat more vegetables, fruits, and low-fat dairy products.  Choose whole grains. Look for the word "whole" as the first word in the ingredient list.  Choose fish and skinless chicken or [place] more often than red meat. Limit fish, poultry, and meat to 6 oz (170 g) each day.  Limit sweets, desserts, sugars, and sugary drinks.  Choose heart-healthy fats.  Limit cheese to 1 oz (28  g) per day.  Eat more home-cooked food and less restaurant, buffet, and fast food.  Limit fried foods.  Cook foods using methods other than frying.  Limit canned vegetables. If you do use them, rinse them well to decrease the sodium.  When eating at a restaurant, ask that your food be prepared with less salt, or no salt if possible. WHAT FOODS CAN I EAT? Seek help from a dietitian for individual calorie needs. Grains Whole grain or whole wheat bread. Brown rice. Whole grain or whole wheat pasta. Quinoa, bulgur, and whole grain cereals. Low-sodium cereals. Corn or whole wheat flour tortillas. Whole grain cornbread. Whole grain crackers. Low-sodium crackers. Vegetables Fresh or frozen vegetables (raw, steamed, roasted, or grilled). Low-sodium or reduced-sodium tomato and vegetable juices. Low-sodium or reduced-sodium tomato sauce and paste. Low-sodium or reduced-sodium canned vegetables.  Fruits All fresh, canned (in natural juice), or frozen fruits. Meat and Other Protein Products Ground beef (85% or leaner), grass-fed beef, or beef trimmed of fat. Skinless chicken or [place]. Ground chicken or [place]. Pork trimmed of fat. All fish and seafood. Eggs. Dried beans, peas, or lentils. Unsalted nuts and seeds. Unsalted canned beans. Dairy Low-fat dairy products, such as skim or 1% milk, 2% or reduced-fat cheeses, low-fat ricotta or cottage cheese, or plain low-fat yogurt. Low-sodium or reduced-sodium cheeses. Fats and Oils Tub margarines without trans fats. Light or reduced-fat mayonnaise and salad dressings (reduced sodium). Avocado. Safflower, olive, or canola oils. Natural peanut or almond butter. Other Unsalted popcorn and pretzels.  The items listed above may not be a complete list of recommended foods or beverages. Contact your dietitian for more options. WHAT FOODS ARE NOT RECOMMENDED? Grains White bread. White pasta. White rice. Refined cornbread. Bagels and croissants. Crackers that  contain trans fat. Vegetables Creamed or fried vegetables. Vegetables in a cheese sauce. Regular canned vegetables. Regular canned tomato sauce and paste. Regular tomato and vegetable juices. Fruits Dried fruits. Canned fruit in light or heavy syrup. Fruit juice. Meat and Other Protein Products Fatty cuts of meat. Ribs, chicken wings, bacon, sausage, bologna, salami, chitterlings, fatback, hot dogs, bratwurst, and packaged luncheon meats. Salted nuts and seeds. Canned beans with salt. Dairy Whole or 2% milk, cream, half-and-half, and cream cheese. Whole-fat or sweetened yogurt. Full-fat cheeses or blue cheese. Nondairy creamers and whipped toppings. Processed cheese, cheese spreads, or cheese curds. Condiments Onion and garlic salt, seasoned salt, table salt, and sea salt. Canned and packaged gravies. Worcestershire sauce. Tartar sauce. Barbecue sauce. Teriyaki sauce. Soy sauce, including reduced sodium. Steak sauce. Fish sauce. Oyster sauce. Cocktail sauce. Horseradish. Ketchup and mustard. Meat flavorings and tenderizers. Bouillon cubes. Hot sauce. Tabasco sauce. Marinades. Taco seasonings. Relishes. Fats and Oils Butter, stick margarine, lard, shortening, ghee, and bacon fat. Coconut, palm kernel, or palm oils. Regular salad dressings. Other Pickles and olives. Salted popcorn and pretzels. The items listed above may not be a complete list of foods and beverages to avoid. Contact your dietitian for more information. WHERE CAN I FIND MORE INFORMATION? National Heart, Lung, and Blood Institute: travelstabloid.com Document Released: 08/29/2011 Document Revised: 01/24/2014 Document Reviewed: 07/14/2013 [facility] Patient Information 2015 [place], [place]. This information is not intended to replace advice given to you by your health care provider. Make sure you discuss any questions you have with your health care provider.

## 2016-11-28 ENCOUNTER — Other Ambulatory Visit: Payer: Self-pay | Admitting: Internal Medicine

## 2016-11-28 ENCOUNTER — Other Ambulatory Visit: Payer: Self-pay

## 2016-11-28 DIAGNOSIS — F32A Depression, unspecified: Secondary | ICD-10-CM

## 2016-11-28 DIAGNOSIS — F329 Major depressive disorder, single episode, unspecified: Secondary | ICD-10-CM

## 2016-12-05 MED ORDER — FLUOXETINE HCL 40 MG PO CAPS: 40.0000 mg | ORAL_CAPSULE | Freq: Every day | ORAL | 3 refills | Status: DC

## 2016-12-05 MED ORDER — AMLODIPINE BESYLATE 5 MG PO TABS: 5.0000 mg | ORAL_TABLET | Freq: Every day | ORAL | 3 refills | Status: DC

## 2016-12-10 ENCOUNTER — Ambulatory Visit (INDEPENDENT_AMBULATORY_CARE_PROVIDER_SITE_OTHER): Payer: Medicare Other | Admitting: Allergy and Immunology

## 2016-12-10 VITALS — BP 140/82 | HR 76 | Temp 98.5°F | Resp 15

## 2016-12-10 DIAGNOSIS — J4540 Moderate persistent asthma, uncomplicated: Secondary | ICD-10-CM | POA: Diagnosis not present

## 2016-12-10 DIAGNOSIS — L989 Disorder of the skin and subcutaneous tissue, unspecified: Secondary | ICD-10-CM

## 2016-12-10 DIAGNOSIS — J3089 Other allergic rhinitis: Secondary | ICD-10-CM | POA: Diagnosis not present

## 2016-12-10 DIAGNOSIS — L308 Other specified dermatitis: Secondary | ICD-10-CM

## 2016-12-10 DIAGNOSIS — K219 Gastro-esophageal reflux disease without esophagitis: Secondary | ICD-10-CM

## 2016-12-10 MED ORDER — OMEPRAZOLE 40 MG PO CPDR: 40.0000 mg | DELAYED_RELEASE_CAPSULE | Freq: Every day | ORAL | 0 refills | Status: DC

## 2016-12-10 MED ORDER — OMEPRAZOLE 40 MG PO CPDR: 40.0000 mg | DELAYED_RELEASE_CAPSULE | Freq: Every morning | ORAL | 3 refills | Status: AC

## 2016-12-10 MED ORDER — RANITIDINE HCL 300 MG PO CAPS: 300.0000 mg | ORAL_CAPSULE | Freq: Every evening | ORAL | 0 refills | Status: DC

## 2016-12-10 MED ORDER — RANITIDINE HCL 300 MG PO CAPS: 300.0000 mg | ORAL_CAPSULE | Freq: Every evening | ORAL | 3 refills | Status: AC

## 2016-12-10 NOTE — Patient Instructions (Addendum)
  1. Treat laryngopharyngeal reflux:   A. minimize all caffeine and chocolate consumption  B. start omeprazole 40 mg one tablet in a.m.  C. start ranitidine 300 mg one tablet in PM  2. Treat and prevent inflammation:   A. mometasone 0.1% ointment applied 1-7 times per week  B. Symbicort 160 - 2 inhalations twice a day with spacer  C. Flonase - one spray each nostril 1-7 times per week  3. Every day take cetirizine 10 mg tablet 1 time per day  4. If needed:   A. Proventil HFA or similar 2 puffs every 4-6 hours  5. Return to clinic in 4 weeks or earlier if problem

## 2016-12-10 NOTE — Progress Notes (Signed)
Follow-up Note  Referring Provider: Unk Pinto, MD Primary Provider: Alesia Richards, MD Date of Office Visit: 12/10/2016  Subjective:   Nancy Myers (DOB: 1946/09/04) is a 71 y.o. female who returns to the [place] on 12/10/2016 in re-evaluation of the following:  HPI: Salome returns to this clinic in reevaluation of her inflammatory dermatosis, asthma, and allergic rhinitis. I have not seen her in his clinic since her initial evaluation of 13 November 2016 at which point in time we have assigned a plan to address each of these issues.  Concerning her skin, her eyelids responded quite well to the administration of mometasone topical agent and she no longer needs to use this agent over the course of the past week or so. However, she's actually had further development of the eczema involving her hands and her neck. She has not applying any topical anti-inflammatory agent to these areas. There is no obvious provoking factor giving rise to this inflammatory dermatosis. She suspects that it may be some type of industrial exposure at work. 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  Concerning her respiratory tract issue, she still continues to have cough and throat clearing and occasional choking episodes even in the face of utilizing anti-inflammatory therapy for her lower respiratory tract. She was somewhat intolerant of using Flonase as she develops significant drying of her nose and some bleeding. She has discontinued this agent over the course of the past 10 days or so. She does not use a short acting bronchodilator.  Allergies as of 12/10/2016      Reactions   Codeine Itching      Medication List      acyclovir 400 MG tablet Commonly known as:  ZOVIRAX Take 1 pill two times daily as needed   amLODipine 5 MG tablet Commonly known as:  NORVASC Take 1 tablet (5 mg total) by mouth  daily.   aspirin 81 MG chewable tablet Chew 162 mg by mouth daily.   atorvastatin 40 MG tablet Commonly known as:  LIPITOR Take 1 tablet (40 mg total) by mouth daily.   BUDESONIDE IN Take by nebulization as needed.   budesonide-formoterol 160-4.5 MCG/ACT inhaler Commonly known as:  SYMBICORT Inhale two puffs twice a day to prevent cough or wheeze.  Rinse, gargle, and spit after use.   carisoprodol 250 MG tablet Commonly known as:  SOMA TAKE 1 TABLET BY MOUTH EVERY 6 HOURS AS NEEDED FOR PAIN   cyanocobalamin 1000 MCG/ML injection Commonly known as:  (VITAMIN B-12) INJECT 1 ML INTRAMUSCULARLY EVERY 30 DAYS   FLUoxetine 40 MG capsule Commonly known as:  PROZAC Take 1 capsule (40 mg total) by mouth daily.   fluticasone 50 MCG/ACT nasal spray Commonly known as:  FLONASE Place 1 spray into both nostrils daily as needed for allergies or rhinitis.   fluticasone-salmeterol 115-21 MCG/ACT inhaler Commonly known as:  ADVAIR HFA Inhale 2 puffs into the lungs 2 (two) times daily.   FREESTYLE FREEDOM LITE w/Device Kit Check Blood Sugar One Time Daily   freestyle lancets Test Blood Sugar One Time Daily   glucose blood test strip Test Blood Sugar One Time Daily   hydrochlorothiazide 25 MG tablet Commonly known as:  HYDRODIURIL Take 1 tablet (25 mg total) by mouth daily.   ipratropium 0.02 % nebulizer solution Commonly known as:  ATROVENT Take 0.5 mg by nebulization every 4 (four) hours as needed for wheezing or shortness of  breath.   losartan 100 MG tablet Commonly known as:  COZAAR Take 100 mg by mouth daily.   Magnesium 500 MG Tabs Take by mouth.   metFORMIN 500 MG tablet Commonly known as:  GLUCOPHAGE Take 2 tablets (1,000 mg total) by mouth daily with breakfast.   metoprolol succinate 100 MG 24 hr tablet Commonly known as:  TOPROL-XL Take 1 tablet (100 mg total) by mouth daily.   mometasone 0.1 % ointment Commonly known as:  ELOCON Apply to affected skin once  daily as directed.   PROAIR HFA 108 (90 Base) MCG/ACT inhaler Generic drug:  albuterol Inhale two puffs every four to six hours as needed for cough or wheeze.   RHINOCORT ALLERGY 32 MCG/ACT nasal spray Generic drug:  budesonide Place 1 spray into both nostrils daily as needed for rhinitis.   valsartan 320 MG tablet Commonly known as:  DIOVAN 1/2-1 tablet daily   vitamin C 500 MG tablet Commonly known as:  ASCORBIC ACID Take 500 mg by mouth daily.   VITAMIN D PO Take 5,000 Units by mouth.       Past Medical History:  Diagnosis Date  . Anemia   . Anxiety   . Arrhythmia   . Cancer [facility]) 1995   Colon Cancer  . Depression   . Diabetes mellitus without complication ([place])   . GERD (gastroesophageal reflux disease)   . Hyperlipidemia   . Hypertension   . IBS (irritable bowel syndrome)   . Vitamin D deficiency     Past Surgical History:  Procedure Laterality Date  . ANTERIOR CRUCIATE LIGAMENT REPAIR Right 04-09-04   right knee  . BUNIONECTOMY Bilateral 02/1987  . BUNIONECTOMY Right 03-08-08   right foot  . CARPAL TUNNEL RELEASE Bilateral 09-30-01;10-21-01  . CATARACT EXTRACTION W/ INTRAOCULAR LENS  IMPLANT, BILATERAL Bilateral 12-07-09;02-15-10  . left foot  05-23-05  . left knee  07-26-02  . SHOULDER SURGERY Left     Review of systems negative except as noted in HPI / PMHx or noted below:  Review of Systems  Constitutional: Negative.   HENT: Negative.   Eyes: Negative.   Respiratory: Negative.   Cardiovascular: Negative.   Gastrointestinal: Negative.   Genitourinary: Negative.   Musculoskeletal: Negative.   Skin: Negative.   Neurological: Negative.   Endo/Heme/Allergies: Negative.   Psychiatric/Behavioral: Negative.      Objective:   Vitals:   12/10/16 0943  BP: 140/82  Pulse: 76  Resp: 15  Temp: 98.5 F (36.9 C)          Physical Exam  Constitutional: She is well-developed, well-nourished, and in no distress.  Coughing spell with slight choking  and inability to use speak for several seconds. Loss of throat clearing.  HENT:  Head: Normocephalic.  Right Ear: Tympanic membrane, external ear and ear canal normal.  Left Ear: Tympanic membrane, external ear and ear canal normal.  Nose: Nose normal. No mucosal edema or rhinorrhea.  Mouth/Throat: Uvula is midline, oropharynx is clear and moist and mucous membranes are normal. No oropharyngeal exudate.  Eyes: Conjunctivae are normal.  Neck: Trachea normal. No tracheal tenderness present. No tracheal deviation present. No thyromegaly present.  Cardiovascular: Normal rate, regular rhythm, S1 normal and S2 normal.   Murmur (early systolic) heard. Pulmonary/Chest: Breath sounds normal. No stridor. No respiratory distress. She has no wheezes. She has no rales.  Musculoskeletal: She exhibits no edema.  Lymphadenopathy:       Head (right side): No tonsillar adenopathy present.  Head (left side): No tonsillar adenopathy present.    She has no cervical adenopathy.  Neurological: She is alert. Gait normal.  Skin: Rash (small 0.5 cm area of erythema affecting left upper eyelid without any induration. Indurated and inflamed and excoriated palmar surface of hands bilaterally and anterior neck.) noted. She is not diaphoretic. No erythema. Nails show no clubbing.  Psychiatric: Mood and affect normal.    Diagnostics:    Spirometry was performed and demonstrated an FEV1 of 0.97 at 53 % of predicted.  The patient had an Asthma Control Test with the following results:  .    Assessment and Plan:   1. Asthma, moderate persistent, well-controlled   2. Other allergic rhinitis   3. Inflammatory dermatosis   4. LPRD (laryngopharyngeal reflux disease)     1. Treat laryngopharyngeal reflux:   A. minimize all caffeine and chocolate consumption  B. start omeprazole 40 mg one tablet in a.m.  C. start ranitidine 300 mg one tablet in PM  2. Treat and prevent inflammation:   A. mometasone 0.1%  ointment applied 1-7 times per week  B. Symbicort 160 - 2 inhalations twice a day with spacer  C. Flonase - one spray each nostril 1-7 times per week  3. Every day take cetirizine 10 mg tablet 1 time per day  4. If needed:   A. Proventil HFA or similar 2 puffs every 4-6 hours  5. Return to clinic in 4 weeks or earlier if problem  I will have Sorrel use topical mometasone at the lowest possible dose to control her inflammatory dermatosis involving her hands and neck and if needed her eyelid. Because she did not have an appropriate response to an appropriate therapy directed against eosinophilic driven inflammation of her respiratory tract I will now empirically treat her for LPR with the therapy mentioned above. There is a strong possibility that she has LPR given all for choking and spasmodic coughing episodes with negative diagnostic testing for significant lung disease in the past. I will regroup with her in 4 weeks to assess her response to this approach and consider further evaluation and treatment based upon her response. If she has absolutely no response to the administration of therapy directed against LPR we need to consider other etiologic factors contributing to her persistent respiratory tract symptoms.  Allena Katz, MD Allergy / Immunology [place]

## 2016-12-11 ENCOUNTER — Encounter: Payer: Self-pay | Admitting: Allergy and Immunology

## 2016-12-26 ENCOUNTER — Other Ambulatory Visit: Payer: Self-pay | Admitting: *Deleted

## 2016-12-26 DIAGNOSIS — F329 Major depressive disorder, single episode, unspecified: Secondary | ICD-10-CM

## 2016-12-26 DIAGNOSIS — F32A Depression, unspecified: Secondary | ICD-10-CM

## 2016-12-26 MED ORDER — FLUOXETINE HCL 40 MG PO CAPS: 40.0000 mg | ORAL_CAPSULE | Freq: Every day | ORAL | 3 refills | Status: DC

## 2016-12-26 MED ORDER — HYDROCHLOROTHIAZIDE 25 MG PO TABS: 25.0000 mg | ORAL_TABLET | Freq: Every day | ORAL | 1 refills | Status: DC

## 2016-12-26 MED ORDER — ATORVASTATIN CALCIUM 40 MG PO TABS: 40.0000 mg | ORAL_TABLET | Freq: Every day | ORAL | 1 refills | Status: AC

## 2016-12-26 MED ORDER — AMLODIPINE BESYLATE 5 MG PO TABS
5.0000 mg | ORAL_TABLET | Freq: Every day | ORAL | 1 refills | Status: DC
Start: 2016-12-26 — End: 2017-08-16

## 2016-12-26 MED ORDER — METOPROLOL SUCCINATE ER 100 MG PO TB24: 100.0000 mg | ORAL_TABLET | Freq: Every day | ORAL | 1 refills | Status: AC

## 2016-12-26 MED ORDER — CARISOPRODOL 250 MG PO TABS: 250.0000 mg | ORAL_TABLET | Freq: Four times a day (QID) | ORAL | 3 refills | Status: DC | PRN

## 2016-12-26 MED ORDER — CYANOCOBALAMIN 1000 MCG/ML IJ SOLN: INTRAMUSCULAR | 2 refills | Status: DC

## 2016-12-26 MED ORDER — BUDESONIDE-FORMOTEROL FUMARATE 160-4.5 MCG/ACT IN AERO: INHALATION_SPRAY | RESPIRATORY_TRACT | 3 refills | Status: AC

## 2016-12-26 MED ORDER — METFORMIN HCL 500 MG PO TABS: 1000.0000 mg | ORAL_TABLET | Freq: Every day | ORAL | 1 refills | Status: DC

## 2017-01-07 ENCOUNTER — Encounter: Payer: Self-pay | Admitting: Allergy and Immunology

## 2017-01-07 ENCOUNTER — Ambulatory Visit (INDEPENDENT_AMBULATORY_CARE_PROVIDER_SITE_OTHER): Payer: Medicare Other | Admitting: Allergy and Immunology

## 2017-01-07 ENCOUNTER — Ambulatory Visit
Admission: RE | Admit: 2017-01-07 | Discharge: 2017-01-07 | Disposition: A | Discharge status: Home / Self Care | Payer: Medicare Other | Source: Ambulatory Visit | Attending: Allergy and Immunology | Admitting: Allergy and Immunology

## 2017-01-07 VITALS — BP 136/90 | HR 76 | Resp 16

## 2017-01-07 DIAGNOSIS — L308 Other specified dermatitis: Secondary | ICD-10-CM

## 2017-01-07 DIAGNOSIS — J4540 Moderate persistent asthma, uncomplicated: Secondary | ICD-10-CM | POA: Diagnosis not present

## 2017-01-07 DIAGNOSIS — R05 Cough: Secondary | ICD-10-CM

## 2017-01-07 DIAGNOSIS — R059 Cough, unspecified: Secondary | ICD-10-CM

## 2017-01-07 DIAGNOSIS — L989 Disorder of the skin and subcutaneous tissue, unspecified: Secondary | ICD-10-CM

## 2017-01-07 DIAGNOSIS — K219 Gastro-esophageal reflux disease without esophagitis: Secondary | ICD-10-CM

## 2017-01-07 DIAGNOSIS — J3089 Other allergic rhinitis: Secondary | ICD-10-CM

## 2017-01-07 DIAGNOSIS — R079 Chest pain, unspecified: Secondary | ICD-10-CM | POA: Diagnosis not present

## 2017-01-07 NOTE — Patient Instructions (Signed)
  1. Continue to Treat laryngopharyngeal reflux:   A. minimize all caffeine and chocolate consumption  B. omeprazole 40 mg one tablet in a.m.  C. ranitidine 300 mg one tablet in PM  2. Continue to Treat and prevent inflammation:   A. mometasone 0.1% ointment applied 1-7 times per week  B. Symbicort 160 - 2 inhalations twice a day with spacer  C. Flonase - one spray each nostril 1-7 times per week  3. Every day take cetirizine 10 mg tablet 1 time per day  4. If needed:   A. Proventil HFA or similar 2 puffs every 4-6 hours  5. Obtain a Chest X-ray  6. Appointment with ENT to look at throat  7. Return to clinic in 8 weeks or earlier if problem

## 2017-01-07 NOTE — Progress Notes (Signed)
Follow-up Note  Referring Provider: Unk Pinto, MD Primary Provider: Alesia Richards, MD Date of Office Visit: 01/07/2017  Subjective:   Nancy Myers (DOB: 07-11-1946) is a 71 y.o. female who returns to the [place] on 01/07/2017 in re-evaluation of the following:  HPI: Nancy Myers returns to this clinic in reevaluation of her inflammatory dermatosis, asthma, and allergic rhinitis and possible LPR. I last saw her in his clinic on 12/10/2016 at which point in time we tried to address each of these issues.  While using montelukast a few times per week she has had very good control of her inflammatory dermatitis involving her face. She is very satisfied with the response that she has received at this point. Over the course of the past week she has not had to use any montelukast at all.  She still continues to have some cough and throat clearing and intermittent choking episodes.  She has not really been having any problems with her upper airways at this point in time.  Allergies as of 01/07/2017      Reactions   Codeine Itching      Medication List      acyclovir 400 MG tablet Commonly known as:  ZOVIRAX Take 1 pill two times daily as needed   amLODipine 5 MG tablet Commonly known as:  NORVASC Take 1 tablet (5 mg total) by mouth daily.   aspirin 81 MG chewable tablet Chew 162 mg by mouth daily.   atorvastatin 40 MG tablet Commonly known as:  LIPITOR Take 1 tablet (40 mg total) by mouth daily.   BUDESONIDE IN Take by nebulization as needed.   budesonide-formoterol 160-4.5 MCG/ACT inhaler Commonly known as:  SYMBICORT Inhale two puffs twice a day to prevent cough or wheeze.  Rinse, gargle, and spit after use.   carisoprodol 250 MG tablet Commonly known as:  SOMA Take 1 tablet (250 mg total) by mouth every 6 (six) hours as needed. for pain   cyanocobalamin 1000 MCG/ML injection Commonly known as:  (VITAMIN B-12) INJECT 1 ML  INTRAMUSCULARLY EVERY 30 DAYS   FLUoxetine 40 MG capsule Commonly known as:  PROZAC Take 1 capsule (40 mg total) by mouth daily.   fluticasone-salmeterol 115-21 MCG/ACT inhaler Commonly known as:  ADVAIR HFA Inhale 2 puffs into the lungs 2 (two) times daily.   FREESTYLE FREEDOM LITE w/Device Kit Check Blood Sugar One Time Daily   freestyle lancets Test Blood Sugar One Time Daily   glucose blood test strip Test Blood Sugar One Time Daily   hydrochlorothiazide 25 MG tablet Commonly known as:  HYDRODIURIL Take 1 tablet (25 mg total) by mouth daily.   ipratropium 0.02 % nebulizer solution Commonly known as:  ATROVENT Take 0.5 mg by nebulization every 4 (four) hours as needed for wheezing or shortness of breath.   losartan 100 MG tablet Commonly known as:  COZAAR Take 100 mg by mouth daily.   Magnesium 500 MG Tabs Take by mouth.   metFORMIN 500 MG tablet Commonly known as:  GLUCOPHAGE Take 2 tablets (1,000 mg total) by mouth daily with breakfast.   metoprolol succinate 100 MG 24 hr tablet Commonly known as:  TOPROL-XL Take 1 tablet (100 mg total) by mouth daily.   mometasone 0.1 % ointment Commonly known as:  ELOCON Apply to affected skin once daily as directed.   omeprazole 40 MG capsule Commonly known as:  PRILOSEC Take 1 capsule (40 mg total) by mouth every morning.   PROAIR  HFA 108 (90 Base) MCG/ACT inhaler Generic drug:  albuterol Inhale two puffs every four to six hours as needed for cough or wheeze.   ranitidine 300 MG capsule Commonly known as:  ZANTAC Take 1 capsule (300 mg total) by mouth every evening.   RHINOCORT ALLERGY 32 MCG/ACT nasal spray Generic drug:  budesonide Place 1 spray into both nostrils daily as needed for rhinitis.   valsartan 320 MG tablet Commonly known as:  DIOVAN 1/2-1 tablet daily   vitamin C 500 MG tablet Commonly known as:  ASCORBIC ACID Take 500 mg by mouth daily.   VITAMIN D PO Take 5,000 Units by mouth.        Past Medical History:  Diagnosis Date  . Anemia   . Anxiety   . Arrhythmia   . Cancer [facility]) 1995   Colon Cancer  . Depression   . Diabetes mellitus without complication ([place])   . GERD (gastroesophageal reflux disease)   . Hyperlipidemia   . Hypertension   . IBS (irritable bowel syndrome)   . Vitamin D deficiency     Past Surgical History:  Procedure Laterality Date  . ANTERIOR CRUCIATE LIGAMENT REPAIR Right 04-09-04   right knee  . BUNIONECTOMY Bilateral 02/1987  . BUNIONECTOMY Right 03-08-08   right foot  . CARPAL TUNNEL RELEASE Bilateral 09-30-01;10-21-01  . CATARACT EXTRACTION W/ INTRAOCULAR LENS  IMPLANT, BILATERAL Bilateral 12-07-09;02-15-10  . left foot  05-23-05  . left knee  07-26-02  . SHOULDER SURGERY Left     Review of systems negative except as noted in HPI / PMHx or noted below:  Review of Systems  Constitutional: Negative.   HENT: Negative.   Eyes: Negative.   Respiratory: Negative.   Cardiovascular: Negative.   Gastrointestinal: Negative.   Genitourinary: Negative.   Musculoskeletal: Negative.   Skin: Negative.   Neurological: Negative.   Endo/Heme/Allergies: Negative.   Psychiatric/Behavioral: Negative.      Objective:   Vitals:   01/07/17 1036  BP: 136/90  Pulse: 76  Resp: 16          Physical Exam  Constitutional: She is well-developed, well-nourished, and in no distress.  Raspy voice, throat clearing  HENT:  Head: Normocephalic.  Right Ear: Tympanic membrane, external ear and ear canal normal.  Left Ear: Tympanic membrane, external ear and ear canal normal.  Nose: Nose normal. No mucosal edema or rhinorrhea.  Mouth/Throat: Uvula is midline, oropharynx is clear and moist and mucous membranes are normal. No oropharyngeal exudate.  Eyes: Conjunctivae are normal.  Neck: Trachea normal. No tracheal tenderness present. No tracheal deviation present. No thyromegaly present.  Cardiovascular: Normal rate, regular rhythm, S1 normal, S2 normal  and normal heart sounds.   No murmur heard. Pulmonary/Chest: Breath sounds normal. No stridor. No respiratory distress. She has no wheezes. She has no rales.  Musculoskeletal: She exhibits no edema.  Lymphadenopathy:       Head (right side): No tonsillar adenopathy present.       Head (left side): No tonsillar adenopathy present.    She has no cervical adenopathy.  Neurological: She is alert. Gait normal.  Skin: No rash noted. She is not diaphoretic. No erythema. Nails show no clubbing.  Psychiatric: Mood and affect normal.    Diagnostics:    Spirometry was performed and demonstrated an FEV1 of 0.89 at 57 % of predicted.    Assessment and Plan:   1. Asthma, moderate persistent, well-controlled   2. Other allergic rhinitis   3. Inflammatory dermatosis  4. LPRD (laryngopharyngeal reflux disease)   5. Cough     1. Continue to Treat laryngopharyngeal reflux:   A. minimize all caffeine and chocolate consumption  B. omeprazole 40 mg one tablet in a.m.  C. ranitidine 300 mg one tablet in PM  2. Continue to Treat and prevent inflammation:   A. mometasone 0.1% ointment applied 1-7 times per week  B. Symbicort 160 - 2 inhalations twice a day with spacer  C. Flonase - one spray each nostril 1-7 times per week  3. Every day take cetirizine 10 mg tablet 1 time per day  4. If needed:   A. Proventil HFA or similar 2 puffs every 4-6 hours  5. Obtain a Chest X-ray  6. Appointment with ENT to look at throat  7. Return to clinic in 8 weeks or earlier if problem  Although Mixtli has had improvement regarding her inflammatory dermatosis and her upper airway disease she has not really responded very well to a combination of therapy directed against inflammation of her lower airways and reflux regarding her incessant coughing and throat clearing and choking episodes. I will now proceed with a imaging study of her lower airway and have a ENT look at her throat while she continues to  utilize the therapy mentioned above. At the end of 12 weeks we will have her very good idea whether or not treatment for LPR will help her regarding all of her respiratory tract symptoms. She will contact me during the interval should there be a significant problem.  Allena Katz, MD Allergy / Immunology [place]

## 2017-01-16 ENCOUNTER — Telehealth: Payer: Self-pay | Admitting: Physician Assistant

## 2017-01-16 ENCOUNTER — Other Ambulatory Visit: Payer: Self-pay | Admitting: Internal Medicine

## 2017-01-16 DIAGNOSIS — Z1231 Encounter for screening mammogram for malignant neoplasm of breast: Secondary | ICD-10-CM

## 2017-01-16 MED ORDER — LEVOFLOXACIN 500 MG PO TABS: 500.0000 mg | ORAL_TABLET | Freq: Every day | ORAL | 0 refills | Status: DC

## 2017-01-16 MED ORDER — PREDNISONE 20 MG PO TABS: ORAL_TABLET | ORAL | 0 refills | Status: DC

## 2017-01-16 NOTE — Telephone Encounter (Signed)
Pt informed of Rx that was sent to pharmacy, pt agreed & hung up

## 2017-01-16 NOTE — Telephone Encounter (Signed)
AAF with history of COPD calls with 1 week of sinus drainage, chills, cough, wheezing, yellow/thick bloody mucus, has been on nasal spray, allergy pill, sees ENT 05/16.   Will send in prednisone and levaquin, keep ENT appointment.  Go to the ER if any worsening SOB, CP, etc.

## 2017-01-22 ENCOUNTER — Ambulatory Visit
Admission: RE | Admit: 2017-01-22 | Discharge: 2017-01-22 | Disposition: A | Discharge status: Home / Self Care | Payer: Medicare Other | Source: Ambulatory Visit | Attending: Internal Medicine | Admitting: Internal Medicine

## 2017-01-22 DIAGNOSIS — Z1231 Encounter for screening mammogram for malignant neoplasm of breast: Secondary | ICD-10-CM | POA: Diagnosis not present

## 2017-02-05 DIAGNOSIS — K219 Gastro-esophageal reflux disease without esophagitis: Secondary | ICD-10-CM | POA: Diagnosis not present

## 2017-02-05 DIAGNOSIS — R490 Dysphonia: Secondary | ICD-10-CM | POA: Diagnosis not present

## 2017-02-11 DIAGNOSIS — R490 Dysphonia: Secondary | ICD-10-CM | POA: Diagnosis not present

## 2017-02-11 DIAGNOSIS — K219 Gastro-esophageal reflux disease without esophagitis: Secondary | ICD-10-CM | POA: Diagnosis not present

## 2017-02-11 DIAGNOSIS — Z85038 Personal history of other malignant neoplasm of large intestine: Secondary | ICD-10-CM | POA: Diagnosis not present

## 2017-03-04 ENCOUNTER — Encounter: Payer: Self-pay | Admitting: Allergy and Immunology

## 2017-03-04 ENCOUNTER — Ambulatory Visit (INDEPENDENT_AMBULATORY_CARE_PROVIDER_SITE_OTHER): Payer: Medicare Other | Admitting: Allergy and Immunology

## 2017-03-04 VITALS — BP 160/88 | HR 84 | Resp 16

## 2017-03-04 DIAGNOSIS — J4540 Moderate persistent asthma, uncomplicated: Secondary | ICD-10-CM | POA: Diagnosis not present

## 2017-03-04 DIAGNOSIS — K219 Gastro-esophageal reflux disease without esophagitis: Secondary | ICD-10-CM

## 2017-03-04 DIAGNOSIS — J3089 Other allergic rhinitis: Secondary | ICD-10-CM

## 2017-03-04 DIAGNOSIS — L989 Disorder of the skin and subcutaneous tissue, unspecified: Secondary | ICD-10-CM

## 2017-03-04 DIAGNOSIS — L308 Other specified dermatitis: Secondary | ICD-10-CM | POA: Diagnosis not present

## 2017-03-04 NOTE — Patient Instructions (Addendum)
  1. Continue to Treat laryngopharyngeal reflux:   A. minimize all caffeine and chocolate consumption  B. omeprazole 40 mg one tablet in a.m.  C. ranitidine 300 mg one tablet in PM  2. Continue to Treat and prevent inflammation:   A. mometasone 0.1% ointment applied 1-7 times per week  B. ADVAIR 115 - 2 inhalations twice a day with spacer (NO SYMBICORT  3. Every day take cetirizine 10 mg tablet 1 time per day  4. If needed:   A. Proair HFA or similar 2 puffs every 4-6 hours  5. Return to clinic in 12 weeks or earlier if problem  6. Obtain upper endoscopy June 25th  7. Review note from Dr. Benjamine Mola

## 2017-03-04 NOTE — Progress Notes (Signed)
Follow-up Note  Referring Provider: Unk Pinto, MD Primary Provider: Unk Pinto, MD Date of Office Visit: 03/04/2017  Subjective:   Nancy Myers (DOB: 07/23/1946) is a 71 y.o. female who returns to the [facility] on 03/04/2017 in re-evaluation of the following:  HPI: Nancy Myers returns to this clinic in evaluation of her asthma and allergic rhinitis and LPR and history of inflammatory dermatosis. Her last visit to this clinic was April 2018.  She does continue to have some cough but she is better regarding this issue while using a large collection of medical treatment. Recently she has been somewhat confused about which medications she is assigned to use. Sounds as though she has using a combination of Advair and Symbicort at this point. She did visit with ENT, Dr. Benjamine Mola, and apparently everything was "okay". She rarely uses a short acting bronchodilator at this point in time.  She has had very little problems with her nose at this point. She no longer needs to use any Flonase. Flonase did give rise to some epistaxis in the past.  She does not think that she's having any problems with reflux. She will be having a upper endoscopy and colonoscopy performed on the 25th of this month by Dr. Oletta Lamas. She does consume chocolate a few times per week and has coffee every day.  She uses topical mometasone on her eyelids about once every 2 weeks or so. Overall she is very satisfied with the response that she is received treating her inflammatory dermatosis in this manner.  Allergies as of 03/04/2017      Reactions   Codeine Itching      Medication List      acyclovir 400 MG tablet Commonly known as:  ZOVIRAX Take 1 pill two times daily as needed   amLODipine 5 MG tablet Commonly known as:  NORVASC Take 1 tablet (5 mg total) by mouth daily.   aspirin 81 MG chewable tablet Chew 162 mg by mouth daily.   atorvastatin 40 MG tablet Commonly known as:   LIPITOR Take 1 tablet (40 mg total) by mouth daily.   budesonide-formoterol 160-4.5 MCG/ACT inhaler Commonly known as:  SYMBICORT Inhale two puffs twice a day to prevent cough or wheeze.  Rinse, gargle, and spit after use.   carisoprodol 250 MG tablet Commonly known as:  SOMA Take 1 tablet (250 mg total) by mouth every 6 (six) hours as needed. for pain   cyanocobalamin 1000 MCG/ML injection Commonly known as:  (VITAMIN B-12) INJECT 1 ML INTRAMUSCULARLY EVERY 30 DAYS   FLUoxetine 40 MG capsule Commonly known as:  PROZAC Take 1 capsule (40 mg total) by mouth daily.   fluticasone-salmeterol 115-21 MCG/ACT inhaler Commonly known as:  ADVAIR HFA Inhale 2 puffs into the lungs 2 (two) times daily.   FREESTYLE FREEDOM LITE w/Device Kit Check Blood Sugar One Time Daily   freestyle lancets Test Blood Sugar One Time Daily   hydrochlorothiazide 25 MG tablet Commonly known as:  HYDRODIURIL Take 1 tablet (25 mg total) by mouth daily.   ipratropium 0.02 % nebulizer solution Commonly known as:  ATROVENT Take 0.5 mg by nebulization every 4 (four) hours as needed for wheezing or shortness of breath.   losartan 100 MG tablet Commonly known as:  COZAAR Take 100 mg by mouth daily.   Magnesium 500 MG Tabs Take by mouth.   metFORMIN 500 MG tablet Commonly known as:  GLUCOPHAGE Take 2 tablets (1,000 mg total) by mouth daily  with breakfast.   metoprolol succinate 100 MG 24 hr tablet Commonly known as:  TOPROL-XL Take 1 tablet (100 mg total) by mouth daily.   mometasone 0.1 % ointment Commonly known as:  ELOCON Apply to affected skin once daily as directed.   omeprazole 40 MG capsule Commonly known as:  PRILOSEC Take 1 capsule (40 mg total) by mouth every morning.   PROAIR HFA 108 (90 Base) MCG/ACT inhaler Generic drug:  albuterol Inhale two puffs every four to six hours as needed for cough or wheeze.   ranitidine 300 MG capsule Commonly known as:  ZANTAC Take 1 capsule (300  mg total) by mouth every evening.   RHINOCORT ALLERGY 32 MCG/ACT nasal spray Generic drug:  budesonide Place 1 spray into both nostrils daily as needed for rhinitis.   valsartan 320 MG tablet Commonly known as:  DIOVAN 1/2-1 tablet daily   vitamin C 500 MG tablet Commonly known as:  ASCORBIC ACID Take 500 mg by mouth daily.   VITAMIN D PO Take 5,000 Units by mouth.       Past Medical History:  Diagnosis Date  . Anemia   . Anxiety   . Arrhythmia   . Cancer [facility]) 1995   Colon Cancer  . Depression   . Diabetes mellitus without complication ([place])   . GERD (gastroesophageal reflux disease)   . Hyperlipidemia   . Hypertension   . IBS (irritable bowel syndrome)   . Vitamin D deficiency     Past Surgical History:  Procedure Laterality Date  . ANTERIOR CRUCIATE LIGAMENT REPAIR Right 04-09-04   right knee  . BUNIONECTOMY Bilateral 02/1987  . BUNIONECTOMY Right 03-08-08   right foot  . CARPAL TUNNEL RELEASE Bilateral 09-30-01;10-21-01  . CATARACT EXTRACTION W/ INTRAOCULAR LENS  IMPLANT, BILATERAL Bilateral 12-07-09;02-15-10  . left foot  05-23-05  . left knee  07-26-02  . SHOULDER SURGERY Left     Review of systems negative except as noted in HPI / PMHx or noted below:  Review of Systems  Constitutional: Negative.   HENT: Negative.   Eyes: Negative.   Respiratory: Negative.   Cardiovascular: Negative.   Gastrointestinal: Negative.   Genitourinary: Negative.   Musculoskeletal: Negative.   Skin: Negative.   Neurological: Negative.   Endo/Heme/Allergies: Negative.   Psychiatric/Behavioral: Negative.      Objective:   Vitals:   03/04/17 1027  BP: (!) 160/88  Pulse: 84  Resp: 16          Physical Exam  Constitutional: She is well-developed, well-nourished, and in no distress.  HENT:  Head: Normocephalic.  Right Ear: Tympanic membrane, external ear and ear canal normal.  Left Ear: Tympanic membrane, external ear and ear canal normal.  Nose: Nose normal. No  mucosal edema or rhinorrhea.  Mouth/Throat: Uvula is midline, oropharynx is clear and moist and mucous membranes are normal. No oropharyngeal exudate.  Eyes: Conjunctivae are normal.  Neck: Trachea normal. No tracheal tenderness present. No tracheal deviation present. No thyromegaly present.  Cardiovascular: Normal rate, regular rhythm, S1 normal, S2 normal and normal heart sounds.   No murmur heard. Pulmonary/Chest: Breath sounds normal. No stridor. No respiratory distress. She has no wheezes. She has no rales.  Musculoskeletal: She exhibits no edema.  Lymphadenopathy:       Head (right side): No tonsillar adenopathy present.       Head (left side): No tonsillar adenopathy present.    She has no cervical adenopathy.  Neurological: She is alert. Gait normal.  Skin:  No rash noted. She is not diaphoretic. No erythema. Nails show no clubbing.  Psychiatric: Mood and affect normal.    Diagnostics: Results of a chest x-ray obtained 01/07/2017 identify the following:  The heart size and mediastinal contours are within normal limits. Both lungs are clear. The visualized skeletal structures are unremarkable.   Spirometry was performed and demonstrated an FEV1 of 0.84 at 57 % of predicted.  The patient had an Asthma Control Test with the following results: ACT Total Score: 18.    Assessment and Plan:   1. Asthma, moderate persistent, well-controlled   2. Other allergic rhinitis   3. LPRD (laryngopharyngeal reflux disease)   4. Inflammatory dermatosis     1. Continue to Treat laryngopharyngeal reflux:   A. minimize all caffeine and chocolate consumption  B. omeprazole 40 mg one tablet in a.m.  C. ranitidine 300 mg one tablet in PM  2. Continue to Treat and prevent inflammation:   A. mometasone 0.1% ointment applied 1-7 times per week  B. ADVAIR 115 - 2 inhalations twice a day with spacer (NO SYMBICORT  3. Every day take cetirizine 10 mg tablet 1 time per day  4. If needed:   A.  Proair HFA or similar 2 puffs every 4-6 hours  5. Return to clinic in 12 weeks or earlier if problem  6. Obtain upper endoscopy June 25th  7. Review note from Dr. Whitney Post appears to be doing relatively well at this point in time on her collection of medical therapy which includes treatment of respiratory tract inflammation and reflux. As well, she does occasionally use therapy for cutaneous inflammation as noted above. I will review the results of her upper endoscopy that is coming up in June 25 and also review the note from Dr. Benjamine Mola concerning evaluation of her throat. I will see her back in this clinic in approximately 12 weeks or earlier if there is a problem.  Allena Katz, MD Allergy / Immunology [place]

## 2017-03-08 DIAGNOSIS — B029 Zoster without complications: Secondary | ICD-10-CM | POA: Diagnosis not present

## 2017-03-10 ENCOUNTER — Other Ambulatory Visit: Payer: Self-pay | Admitting: *Deleted

## 2017-03-10 ENCOUNTER — Ambulatory Visit (INDEPENDENT_AMBULATORY_CARE_PROVIDER_SITE_OTHER): Payer: Medicare Other | Admitting: Internal Medicine

## 2017-03-10 ENCOUNTER — Encounter: Payer: Self-pay | Admitting: Internal Medicine

## 2017-03-10 VITALS — BP 152/84 | HR 76 | Temp 97.7°F | Resp 16 | Ht 60.0 in | Wt 147.4 lb

## 2017-03-10 DIAGNOSIS — E559 Vitamin D deficiency, unspecified: Secondary | ICD-10-CM | POA: Diagnosis not present

## 2017-03-10 DIAGNOSIS — Z79899 Other long term (current) drug therapy: Secondary | ICD-10-CM

## 2017-03-10 DIAGNOSIS — B029 Zoster without complications: Secondary | ICD-10-CM

## 2017-03-10 DIAGNOSIS — E1149 Type 2 diabetes mellitus with other diabetic neurological complication: Secondary | ICD-10-CM | POA: Diagnosis not present

## 2017-03-10 DIAGNOSIS — I10 Essential (primary) hypertension: Secondary | ICD-10-CM

## 2017-03-10 DIAGNOSIS — K2100 Gastro-esophageal reflux disease with esophagitis, without bleeding: Secondary | ICD-10-CM

## 2017-03-10 DIAGNOSIS — E782 Mixed hyperlipidemia: Secondary | ICD-10-CM

## 2017-03-10 LAB — CBC WITH DIFFERENTIAL/PLATELET
BASOS ABS: 34 {cells}/uL (ref 0–200)
Basophils Relative: 1 %
Eosinophils Absolute: 204 cells/uL (ref 15–500)
Eosinophils Relative: 6 %
HEMATOCRIT: 39 % (ref 35.0–45.0)
Hemoglobin: 12.8 g/dL (ref 11.7–15.5)
LYMPHS PCT: 42 %
Lymphs Abs: 1428 cells/uL (ref 850–3900)
MCH: 27.1 pg (ref 27.0–33.0)
MCHC: 32.8 g/dL (ref 32.0–36.0)
MCV: 82.5 fL (ref 80.0–100.0)
MPV: 9.4 fL (ref 7.5–12.5)
Monocytes Absolute: 544 cells/uL (ref 200–950)
Monocytes Relative: 16 %
Neutro Abs: 1190 cells/uL — ABNORMAL LOW (ref 1500–7800)
Neutrophils Relative %: 35 %
PLATELETS: 291 10*3/uL (ref 140–400)
RBC: 4.73 MIL/uL (ref 3.80–5.10)
RDW: 14.2 % (ref 11.0–15.0)
WBC: 3.4 10*3/uL — ABNORMAL LOW (ref 3.8–10.8)

## 2017-03-10 LAB — TSH: TSH: 2.83 m[IU]/L

## 2017-03-10 MED ORDER — GABAPENTIN 100 MG PO CAPS: ORAL_CAPSULE | ORAL | 1 refills | Status: DC

## 2017-03-10 MED ORDER — PREDNISONE 20 MG PO TABS: ORAL_TABLET | ORAL | 0 refills | Status: DC

## 2017-03-10 NOTE — Patient Instructions (Signed)

## 2017-03-10 NOTE — Progress Notes (Signed)
This very nice 71 y.o. single black female presents for  follow up with Hypertension, Hyperlipidemia, T2_NIDDM  and Vitamin D Deficiency. Patient also has GERD controlled with her Omeprazole/Ranitidine. Patient is followed by Dr Carmelina Peal for for asthma & allergy meds.      Patient was dx'd 3 days ago at [facility] and was Rx'd Zovirax and tramadol. And is still c/o pain of the Lt buttock and anterolateral thigh.      Patient is treated for HTN (1980's) & BP has been controlled at home. Today's BP is not at goal - 152/84. Patient has had no complaints of any cardiac type chest pain, palpitations, dyspnea/orthopnea/PND, dizziness, claudication, or dependent edema.     Hyperlipidemia is controlled with diet & meds. Patient denies myalgias or other med SE's. Last Lipids were  Lab Results  Component Value Date   CHOL 176 10/22/2016   HDL 45 (L) 10/22/2016   LDLCALC 89 10/22/2016   TRIG 212 (H) 10/22/2016   CHOLHDL 3.9 10/22/2016      Also, the patient has history of T2_NIDDM (2005) and has had no symptoms of reactive hypoglycemia, diabetic polys, paresthesias or visual blurring.  Last A1c was not at goal commensurate with her dietary compliance: Lab Results  Component Value Date   HGBA1C 6.9 (H) 10/22/2016      Further, the patient also has history of Vitamin D Deficiency and supplements vitamin D sporadically. Last vitamin D was still low:  Lab Results  Component Value Date   VD25OH 30 03/31/2015   Current Outpatient Prescriptions on File Prior to Visit  Medication Sig  . acyclovir  400 MG  Take 1 pill two times daily as needed  . albuterol  HFA inhaler two puffs every 4-6 hrs as needed   . amLODipine 5 MG  Take 1 tab daily.  Marland Kitchen aspirin 81 MG  Chew 162 mg  daily.   Marland Kitchen atorvastatin  40 MG  Take 1 tab daily.  Marland Kitchen RHINOCORT nasal spray Place 1 spray into both nostrils daily as needed for rhinitis.  . SYMBICORT 160-4.5 inhaler two puffs twice a day to prevent cough or wheeze.      . carisoprodol  250 MG Take 1 tab every 6  hrs as needed. for pain  . VITAMIN D 5,000 Units  Take daily  . VIT B-12 1000 MCG  injec INJECT 1 ML IM EVERY 30 DAYS  . FLUoxetine  40 MG  Take 1 cap daily.  Marland Kitchen ADVAIR HFA 115-21  inhaler Inhale 2 puffs  2 times daily.  . hydrochlorothiazide  25 MG Take 1 tab daily.  . ATROVENT neb soln Take 0.5 mg by neb every 4  hrs as needed   . losartan  100 MG  Take  daily.  . Magnesium 500 MG  Take daily.  . metFORMIN 500 MG Take 2 tabs daily with breakfast.  . metoprolol succ-XL 100 MG  Take 1 tab= daily.  Marland Kitchen ELOCON)0.1 % oint Apply to affected skin once daily as directed.  Marland Kitchen omeprazole  40 MG  Takes 1/2 tab = 20 mg  every morning  . ranitidine  300 MG  Take 150 mg  every evening  . valsartan  320 MG  1/2-1 tablet daily  . vitamin C 500 MG  Take 500 mg  daily.   Allergies  Allergen Reactions  . Codeine Itching   PMHx:   Past Medical History:  Diagnosis Date  . Anemia   .  Anxiety   . Arrhythmia   . Cancer [facility]) 1995   Colon Cancer  . Depression   . Diabetes mellitus without complication ([place])   . GERD (gastroesophageal reflux disease)   . Hyperlipidemia   . Hypertension   . IBS (irritable bowel syndrome)   . Vitamin D deficiency    Immunization History  Administered Date(s) Administered  . Influenza Split 06/23/2013  . Influenza, High Dose Seasonal PF 08/01/2014, 07/19/2015, 06/10/2016  . Pneumococcal Conjugate-13 09/21/2014  . Pneumococcal-Unspecified 09/03/2012  . Td 10/03/2010   Past Surgical History:  Procedure Laterality Date  . ANTERIOR CRUCIATE LIGAMENT REPAIR Right 04-09-04   right knee  . BUNIONECTOMY Bilateral 02/1987  . BUNIONECTOMY Right 03-08-08   right foot  . CARPAL TUNNEL RELEASE Bilateral 09-30-01;10-21-01  . CATARACT EXTRACTION W/ INTRAOCULAR LENS  IMPLANT, BILATERAL Bilateral 12-07-09;02-15-10  . left foot  05-23-05  . left knee  07-26-02  . SHOULDER SURGERY Left    FHx:    Reviewed / unchanged  SHx:    Reviewed /  unchanged  Systems Review:  Constitutional: Denies fever, chills, wt changes, headaches, insomnia, fatigue, night sweats, change in appetite. Eyes: Denies redness, blurred vision, diplopia, discharge, itchy, watery eyes.  ENT: Denies discharge, congestion, post nasal drip, epistaxis, sore throat, earache, hearing loss, dental pain, tinnitus, vertigo, sinus pain, snoring.  CV: Denies chest pain, palpitations, irregular heartbeat, syncope, dyspnea, diaphoresis, orthopnea, PND, claudication or edema. Respiratory: denies cough, dyspnea, DOE, pleurisy, hoarseness, laryngitis, wheezing.  Gastrointestinal: Denies dysphagia, odynophagia, heartburn, reflux, water brash, abdominal pain or cramps, nausea, vomiting, bloating, diarrhea, constipation, hematemesis, melena, hematochezia  or hemorrhoids. Genitourinary: Denies dysuria, frequency, urgency, nocturia, hesitancy, discharge, hematuria or flank pain. Musculoskeletal: Denies arthralgias, myalgias, stiffness, jt. swelling, pain, limping or strain/sprain.  Skin: Denies pruritus, rash, hives, warts, acne, eczema or change in skin lesion(s). Neuro: No weakness, tremor, incoordination, spasms, paresthesia or pain. Psychiatric: Denies confusion, memory loss or sensory loss. Endo: Denies change in weight, skin or hair change.  Heme/Lymph: No excessive bleeding, bruising or enlarged lymph nodes.  Physical Exam  BP (!) 152/84   Pulse 76   Temp 97.7 F (36.5 C)   Resp 16   Ht 5' (1.524 m)   Wt 147 lb 6.4 oz (66.9 kg)   BMI 28.79 kg/m   Appears over nourished, well groomed  and in no distress.  Eyes: PERRLA, EOMs, conjunctiva no swelling or erythema. Sinuses: No frontal/maxillary tenderness ENT/Mouth: EAC's clear, TM's nl w/o erythema, bulging. Nares clear w/o erythema, swelling, exudates. Oropharynx clear without erythema or exudates. Oral hygiene is good. Tongue normal, non obstructing. Hearing intact.  Neck: Supple. Thyroid nl. Car 2+/2+ without  bruits, nodes or JVD. Chest: Respirations nl with BS clear & equal w/o rales, rhonchi, wheezing or stridor.  Cor: Heart sounds normal w/ regular rate and rhythm without sig. murmurs, gallops, clicks or rubs. Peripheral pulses normal and equal  without edema.  Abdomen: Soft & bowel sounds normal. Non-tender w/o guarding, rebound, hernias, masses or organomegaly.  Lymphatics: Unremarkable.  Musculoskeletal: Full ROM all peripheral extremities, joint stability, 5/5 strength and normal gait.  Skin: Warm, dry without exposed rashes, lesions or ecchymosis apparent.  Neuro: Cranial nerves intact, reflexes equal bilaterally. Sensory-motor testing grossly intact. Tendon reflexes grossly intact.  Pysch: Alert & oriented x 3.  Insight and judgement nl & appropriate. No ideations.  Assessment and Plan:  1. Essential hypertension  - Continue medication, monitor blood pressure at home.  - Continue DASH diet. Reminder to  go to the ER if any CP,  SOB, nausea, dizziness, severe HA, changes vision/speech,  left arm numbness and tingling and jaw pain.  - BASIC METABOLIC PANEL WITH GFR - Magnesium - TSH  2. Hyperlipidemia, mixed  - Continue diet/meds, exercise,& lifestyle modifications.  - Continue monitor periodic cholesterol/liver & renal functions   - Hepatic function panel - Lipid panel - TSH  3. T2_NIDDM w/ Peripheral Sensory Neuropathy  - Continue diet, exercise, lifestyle modifications.  - Monitor appropriate labs.  - Hemoglobin A1c - Insulin, random  4. Vitamin D deficiency  - Continue supplementation.  - VITAMIN D 25 Hydroxy   5. Gastroesophageal reflux disease   6. Thigh shingles  - Added Rx Prednisone pulse / taper and Gabapentin and discussed potential for sugar elevation and how to titrate the Gabapentin and asked to return in 2 weeks or call /return sooner if questions   7. Medication management  - CBC with Differential/Platelet - BASIC METABOLIC PANEL WITH GFR -  Hepatic function panel - Magnesium - Lipid panel - TSH - Hemoglobin A1c - Insulin, random - VITAMIN D 25 Hydroxy (Vit-D Deficiency, Fractures)        Discussed  regular exercise, BP monitoring, weight control to achieve/maintain BMI less than 25 and discussed med and SE's. Recommended labs to assess and monitor clinical status with further disposition pending results of labs. Over 30 minutes of exam, counseling, chart review was performed.

## 2017-03-11 LAB — BASIC METABOLIC PANEL WITH GFR
BUN: 7 mg/dL (ref 7–25)
CHLORIDE: 99 mmol/L (ref 98–110)
CO2: 30 mmol/L (ref 20–31)
CREATININE: 0.71 mg/dL (ref 0.60–0.93)
Calcium: 9.5 mg/dL (ref 8.6–10.4)
GFR, Est African American: >89 mL/min (ref >=60)
GFR, Est Non African American: 87 mL/min (ref >=60)
GLUCOSE: 105 mg/dL — AB (ref 65–99)
Potassium: 4.3 mmol/L (ref 3.5–5.3)
Sodium: 137 mmol/L (ref 135–146)

## 2017-03-11 LAB — MAGNESIUM: MAGNESIUM: 2.2 mg/dL (ref 1.5–2.5)

## 2017-03-11 LAB — LIPID PANEL
Cholesterol: 236 mg/dL — ABNORMAL HIGH (ref <200)
HDL: 39 mg/dL — AB (ref >50)
LDL CALC: 149 mg/dL — AB (ref <100)
Total CHOL/HDL Ratio: 6.1 Ratio — ABNORMAL HIGH (ref <5.0)
Triglycerides: 241 mg/dL — ABNORMAL HIGH (ref <150)
VLDL: 48 mg/dL — ABNORMAL HIGH (ref <30)

## 2017-03-11 LAB — HEPATIC FUNCTION PANEL
ALBUMIN: 4.1 g/dL (ref 3.6–5.1)
ALT: 12 U/L (ref 6–29)
AST: 17 U/L (ref 10–35)
Alkaline Phosphatase: 130 U/L (ref 33–130)
BILIRUBIN TOTAL: 0.5 mg/dL (ref 0.2–1.2)
Bilirubin, Direct: 0.1 mg/dL (ref <=0.2)
Indirect Bilirubin: 0.4 mg/dL (ref 0.2–1.2)
TOTAL PROTEIN: 7.1 g/dL (ref 6.1–8.1)

## 2017-03-11 LAB — VITAMIN D 25 HYDROXY (VIT D DEFICIENCY, FRACTURES): VIT D 25 HYDROXY: 24 ng/mL — AB (ref 30–100)

## 2017-03-11 LAB — HEMOGLOBIN A1C
HEMOGLOBIN A1C: 7.1 % — AB (ref <5.7)
MEAN PLASMA GLUCOSE: 157 mg/dL

## 2017-03-11 LAB — INSULIN, RANDOM: Insulin: 6.9 u[IU]/mL (ref 2.0–19.6)

## 2017-03-24 ENCOUNTER — Encounter: Payer: Self-pay | Admitting: Internal Medicine

## 2017-03-24 ENCOUNTER — Ambulatory Visit (INDEPENDENT_AMBULATORY_CARE_PROVIDER_SITE_OTHER): Payer: Medicare Other | Admitting: Internal Medicine

## 2017-03-24 VITALS — BP 140/82 | HR 80 | Temp 97.9°F | Resp 16 | Ht 60.0 in | Wt 146.8 lb

## 2017-03-24 DIAGNOSIS — B029 Zoster without complications: Secondary | ICD-10-CM

## 2017-03-24 MED ORDER — GABAPENTIN 100 MG PO CAPS: ORAL_CAPSULE | ORAL | 1 refills | Status: DC

## 2017-03-24 NOTE — Progress Notes (Signed)
Subjective:    Patient ID: Nancy Myers, female    DOB: 06-28-1946, 71 y.o.   MRN: [id number]  HPI   Patient returns for 2 week f/u from 6/18 OV after seen in Urgent care and treated for Shingles of the Lt buttock and Lt anterolateral thigh with Acyclovir. In add'n she was Rx'd a 11 day Prednisone taper and Gabapentin 100 mg which she's taking 2 caps bid with partial relief od a burning stinging pain. She's been out of work since 6/18 and still doesn't feel like she can safely work taking the Gabapentin and the pain in her leg.   Medication Sig  . acyclovir  400 MG tablet Take 1 pill two times daily as needed  . albuterol HFA   inhaler Inhale two puffs every four to six hours as needed for cough or wheeze.  Marland Kitchen amLODipine  5 MG tablet Take 1 tablet (5 mg total) by mouth daily.  Marland Kitchen aspirin 81 MG Chew 162 mg by mouth daily.   Marland Kitchen atorvastatin 40 MG tablet Take 1 tablet (40 mg total) by mouth daily.  Marland Kitchen RHINOCORT  nasal spray Place 1 spray into both nostrils daily as needed for rhinitis.  . SYMBICORT160-4.5 inhaler two puffs twice a day   . SOMA 250 MG tablet Take 1 tablet (250 mg total) by mouth every 6 (six) hours as needed. for pain  . VITAMIN D Take 5,000 Units by mouth.  Marland Kitchen VITAMIN B-12  1000 MCG inj INJECT 1 ML INTRAMUSCULARLY EVERY 30 DAYS  . FLUoxetine  40 MG capsule Take 1 capsule (40 mg total) by mouth daily.  Marland Kitchen ADVAIR HFA 115-21 inhaler Inhale 2 puffs into the lungs 2 (two) times daily.  Marland Kitchen gabapentin 100 MG capsule Take 1 to 2 capsules 3 x / day for shingles pain  . hctz  25 MG tablet Take 1 tablet (25 mg total) by mouth daily.  . ATROVENTneb solution Take  neb every 4 hours as needed  . losartan 100 MG tablet Take 100 mg by mouth daily.  . Magnesium 500 MG TABS Take by mouth.  . metFORMIN 500 MG tablet Take 2 tablets (1,000 mg total) by mouth daily with breakfast.  . metoprolol succ-XL 100 MG  Take 1 tablet (100 mg total) by mouth daily.  Marland Kitchen ELOCON 0.1 % oint Apply to affected skin  once daily as directed.  Marland Kitchen omeprazole  40 MG capsule Take 1 capsule (40 mg total) by mouth every morning. (Patient taking differently: Take 20 mg by mouth every morning. )  . ranitidine  300 MG capsule Take 1 capsule (300 mg total) by mouth every evening. (Patient taking differently: Take 150 mg by mouth every evening. )  . valsartan  320 MG tablet 1/2-1 tablet daily  . vitamin C 500 MG tablet Take 500 mg by mouth daily.   Allergies  Allergen Reactions  . Codeine Itching   Past Medical History:  Diagnosis Date  . Anemia   . Anxiety   . Arrhythmia   . Cancer [facility]) 1995   Colon Cancer  . Depression   . Diabetes mellitus without complication ([place])   . GERD (gastroesophageal reflux disease)   . Hyperlipidemia   . Hypertension   . IBS (irritable bowel syndrome)   . Vitamin D deficiency    Past Surgical History:  Procedure Laterality Date  . ANTERIOR CRUCIATE LIGAMENT REPAIR Right 04-09-04   right knee  . BUNIONECTOMY Bilateral 02/1987  . BUNIONECTOMY Right 03-08-08  right foot  . CARPAL TUNNEL RELEASE Bilateral 09-30-01;10-21-01  . CATARACT EXTRACTION W/ INTRAOCULAR LENS  IMPLANT, BILATERAL Bilateral 12-07-09;02-15-10  . left foot  05-23-05  . left knee  07-26-02  . SHOULDER SURGERY Left    Review of Systems  10 point systems review negative except as above.    Objective:   Physical Exam   BP 140/82   Pulse 80   Temp 97.9 F (36.6 C)   Resp 16   Ht 5' (1.524 m)   Wt 146 lb 12.8 oz (66.6 kg)   BMI 28.67 kg/m   HEENT - WNL. Neck - supple.  Chest - Clear equal BS. Cor - Nl HS. RRR w/o sig MGR. PP 1(+). No edema. MS- FROM w/o deformities.  Gait Nl. Neuro -  Nl w/o focal abnormalities. Skin - dry crusting rash scattered over the Lt anterolateral thigh.     Assessment & Plan:    1. Left Thigh shingles  - gabapentin (NEURONTIN) 100 MG capsule; Take 1 capsule 3 x / day for shingles pain  Dispense: 90 capsule; Refill: 1  - note OOW  Til return in 2 weeks  July 16 or 17

## 2017-04-01 ENCOUNTER — Other Ambulatory Visit: Payer: Self-pay | Admitting: Internal Medicine

## 2017-04-08 ENCOUNTER — Ambulatory Visit: Payer: Self-pay | Admitting: Internal Medicine

## 2017-04-08 DIAGNOSIS — Z85038 Personal history of other malignant neoplasm of large intestine: Secondary | ICD-10-CM | POA: Diagnosis not present

## 2017-04-08 DIAGNOSIS — K219 Gastro-esophageal reflux disease without esophagitis: Secondary | ICD-10-CM | POA: Diagnosis not present

## 2017-04-08 DIAGNOSIS — K222 Esophageal obstruction: Secondary | ICD-10-CM | POA: Diagnosis not present

## 2017-04-08 DIAGNOSIS — K5730 Diverticulosis of large intestine without perforation or abscess without bleeding: Secondary | ICD-10-CM | POA: Diagnosis not present

## 2017-04-08 DIAGNOSIS — K640 First degree hemorrhoids: Secondary | ICD-10-CM | POA: Diagnosis not present

## 2017-04-08 DIAGNOSIS — K30 Functional dyspepsia: Secondary | ICD-10-CM | POA: Diagnosis not present

## 2017-04-09 ENCOUNTER — Ambulatory Visit (INDEPENDENT_AMBULATORY_CARE_PROVIDER_SITE_OTHER): Payer: Medicare Other | Admitting: Internal Medicine

## 2017-04-09 ENCOUNTER — Encounter: Payer: Self-pay | Admitting: Internal Medicine

## 2017-04-09 VITALS — BP 138/84 | HR 69 | Temp 97.9°F | Resp 16 | Ht 60.0 in | Wt 144.2 lb

## 2017-04-09 DIAGNOSIS — B029 Zoster without complications: Secondary | ICD-10-CM

## 2017-04-09 DIAGNOSIS — E1149 Type 2 diabetes mellitus with other diabetic neurological complication: Secondary | ICD-10-CM

## 2017-04-09 DIAGNOSIS — I10 Essential (primary) hypertension: Secondary | ICD-10-CM

## 2017-04-09 DIAGNOSIS — E782 Mixed hyperlipidemia: Secondary | ICD-10-CM

## 2017-04-09 NOTE — Progress Notes (Signed)
Subjective:    Patient ID: Nancy Myers, female    DOB: [birth year], 71 y.o.   MRN: [id number]  HPI  Patient is seen in f/u of initial visit on 6/15 at an urgent care for Shingles of the Lt anterior thigh and then seen here 6/18 and 03/24/2017. Patient felt that she was unable to work while taking gabapentin - which she has now weaned off of.   Medication Sig  . acyclovir (ZOVIRAX) 400 MG tablet Take 1 pill two times daily as needed  . albuterol (PROAIR HFA) 108 (90 Base) MCG/ACT inhaler Inhale two puffs every four to six hours as needed for cough or wheeze.  Marland Kitchen amLODipine (NORVASC) 5 MG tablet Take 1 tablet (5 mg total) by mouth daily.  Marland Kitchen aspirin 81 MG chewable tablet Chew 162 mg by mouth daily.   Marland Kitchen atorvastatin (LIPITOR) 40 MG tablet Take 1 tablet (40 mg total) by mouth daily.  . Blood Glucose Monitoring Suppl (FREESTYLE FREEDOM LITE) W/DEVICE KIT Check Blood Sugar One Time Daily  . budesonide (RHINOCORT ALLERGY) 32 MCG/ACT nasal spray Place 1 spray into both nostrils daily as needed for rhinitis.  . budesonide-formoterol (SYMBICORT) 160-4.5 MCG/ACT inhaler Inhale two puffs twice a day to prevent cough or wheeze.  Rinse, gargle, and spit after use.  . carisoprodol (SOMA) 250 MG tablet Take 1 tablet (250 mg total) by mouth every 6 (six) hours as needed. for pain  . Cholecalciferol (VITAMIN D PO) Take 5,000 Units by mouth.  . cyanocobalamin (,VITAMIN B-12,) 1000 MCG/ML injection INJECT 1 ML INTRAMUSCULARLY EVERY 30 DAYS  . FLUoxetine (PROZAC) 40 MG capsule Take 1 capsule (40 mg total) by mouth daily.  . fluticasone-salmeterol (ADVAIR HFA) 115-21 MCG/ACT inhaler Inhale 2 puffs into the lungs 2 (two) times daily.  . hydrochlorothiazide (HYDRODIURIL) 25 MG tablet Take 1 tablet (25 mg total) by mouth daily.  Marland Kitchen ipratropium (ATROVENT) 0.02 % nebulizer solution Take 0.5 mg by nebulization every 4 (four) hours as needed for wheezing or shortness of breath.  . Lancets (FREESTYLE) lancets Test Blood  Sugar One Time Daily  . losartan (COZAAR) 100 MG tablet Take 100 mg by mouth daily.  . Magnesium 500 MG TABS Take by mouth.  . metFORMIN (GLUCOPHAGE) 500 MG tablet Take 2 tablets (1,000 mg total) by mouth daily with breakfast.  . metoprolol succinate (TOPROL-XL) 100 MG 24 hr tablet Take 1 tablet (100 mg total) by mouth daily.  . mometasone (ELOCON) 0.1 % ointment Apply to affected skin once daily as directed.  Marland Kitchen omeprazole (PRILOSEC) 40 MG capsule Take 1 capsule (40 mg total) by mouth every morning. (Patient taking differently: Take 20 mg by mouth every morning. )  . ranitidine (ZANTAC) 300 MG capsule Take 1 capsule (300 mg total) by mouth every evening. (Patient taking differently: Take 150 mg by mouth every evening. )  . valsartan (DIOVAN) 320 MG tablet 1/2-1 tablet daily  . vitamin C (ASCORBIC ACID) 500 MG tablet Take 500 mg by mouth daily.  Marland Kitchen gabapentin (NEURONTIN) 100 MG capsule Take 1 capsule 3 x / day for shingles pain  . predniSONE (DELTASONE) 20 MG tablet 1 tab 3 x day for 3 days, then 1 tab 2 x day for 3 days, then 1 tab 1 x day for 5 days   Allergies  Allergen Reactions  . Codeine Itching   Past Medical History:  Diagnosis Date  . Anemia   . Anxiety   . Arrhythmia   . Cancer [facility]) 1995  Colon Cancer  . Depression   . Diabetes mellitus without complication ([place])   . GERD (gastroesophageal reflux disease)   . Hyperlipidemia   . Hypertension   . IBS (irritable bowel syndrome)   . Vitamin D deficiency    Past Surgical History:  Procedure Laterality Date  . ANTERIOR CRUCIATE LIGAMENT REPAIR Right 04-09-04   right knee  . BUNIONECTOMY Bilateral 02/1987  . BUNIONECTOMY Right 03-08-08   right foot  . CARPAL TUNNEL RELEASE Bilateral 09-30-01;10-21-01  . CATARACT EXTRACTION W/ INTRAOCULAR LENS  IMPLANT, BILATERAL Bilateral 12-07-09;02-15-10  . left foot  05-23-05  . left knee  07-26-02  . SHOULDER SURGERY Left    Review of Systems  10 point systems review negative except as  above.     Objective:   Physical Exam  BP 158/74 - rechecked at 138/84   P    69   T 97.9 F    R 16   Ht 5'    Wt 144 lb   BMI 28.16   HEENT - WNL. Neck - supple.  Chest - Clear equal BS. Cor - Nl HS. RRR w/o sig MGR. PP 1(+). No edema. MS- FROM w/o deformities.  Gait Nl. Neuro -  Nl w/o focal abnormalities. Skin - few scars left anterior thigh. No skin sensitivity     Assessment & Plan:   1. Thigh shingles  - advised may return to work this afternoon  2. Essential hypertension   3. Hyperlipidemia, mixed   4. 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

## 2017-04-09 NOTE — Patient Instructions (Addendum)
Shingles Shingles, which is also known as herpes zoster, is an infection that causes a painful skin rash and fluid-filled blisters. Shingles is not related to genital herpes, which is a sexually transmitted infection. Shingles only develops in people who:  Have had chickenpox.  Have received the chickenpox vaccine. (This is rare.)  What are the causes? Shingles is caused by varicella-zoster virus (VZV). This is the same virus that causes chickenpox. After exposure to VZV, the virus stays in the body in an inactive (dormant) state. Shingles develops if the virus reactivates. This can happen many years after the initial exposure to VZV. It is not known what causes this virus to reactivate. What increases the risk? People who have had chickenpox or received the chickenpox vaccine are at risk for shingles. Infection is more common in people who:  Are older than age 50.  Have a weakened defense (immune) system, such as those with HIV, AIDS, or cancer.  Are taking medicines that weaken the immune system, such as transplant medicines.  Are under great stress.  What are the signs or symptoms? Early symptoms of this condition include itching, tingling, and pain in an area on your skin. Pain may be described as burning, stabbing, or throbbing. A few days or weeks after symptoms start, a painful red rash appears, usually on one side of the body in a bandlike or beltlike pattern. The rash eventually turns into fluid-filled blisters that break open, scab over, and dry up in about 2-3 weeks. At any time during the infection, you may also develop:  A fever.  Chills.  A headache.  An upset stomach.  How is this diagnosed? This condition is diagnosed with a skin exam. Sometimes, skin or fluid samples are taken from the blisters before a diagnosis is made. These samples are examined under a microscope or sent to a lab for testing. How is this treated? There is no specific cure for this condition.  Your health care provider will probably prescribe medicines to help you manage pain, recover more quickly, and avoid long-term problems. Medicines may include:  Antiviral drugs.  Anti-inflammatory drugs.  Pain medicines.  If the area involved is on your face, you may be referred to a specialist, such as an eye doctor (ophthalmologist) or an ear, nose, and throat (ENT) doctor to help you avoid eye problems, chronic pain, or disability. Follow these instructions at home: Medicines  Take medicines only as directed by your health care provider.  Apply an anti-itch or numbing cream to the affected area as directed by your health care provider. Blister and Rash Care  Take a cool bath or apply cool compresses to the area of the rash or blisters as directed by your health care provider. This may help with pain and itching.  Keep your rash covered with a loose bandage (dressing). Wear loose-fitting clothing to help ease the pain of material rubbing against the rash.  Keep your rash and blisters clean with mild soap and cool water or as directed by your health care provider.  Check your rash every day for signs of infection. These include redness, swelling, and pain that lasts or increases.  Do not pick your blisters.  Do not scratch your rash. General instructions  Rest as directed by your health care provider.  Keep all follow-up visits as directed by your health care provider. This is important.  Until your blisters scab over, your infection can cause chickenpox in people who have never had it or been vaccinated   against it. To prevent this from happening, avoid contact with other people, especially: ? Babies. ? Pregnant women. ? Children who have eczema. ? Elderly people who have transplants. ? People who have chronic illnesses, such as leukemia or AIDS. Contact a health care provider if:  Your pain is not relieved with prescribed medicines.  Your pain does not get better after  the rash heals.  Your rash looks infected. Signs of infection include redness, swelling, and pain that lasts or increases. Get help right away if:  The rash is on your face or nose.  You have facial pain, pain around your eye area, or loss of feeling on one side of your face.  You have ear pain or you have ringing in your ear.  You have loss of taste.  Your condition gets worse. This information is not intended to replace advice given to you by your health care provider. Make sure you discuss any questions you have with your health care provider. Document Released: 09/09/2005 Document Revised: 05/05/2016 Document Reviewed: 07/21/2014 Elsevier Interactive Patient Education  2017 [employer].  +++++++++++++++++++++++++++++ Recommend Adult Low Dose Aspirin or  coated  Aspirin 81 mg daily  To reduce risk of Colon Cancer 20 %,  Skin Cancer 26 % ,  Melanoma 46%  and  Pancreatic cancer 60% +++++++++++++++++++++++++ Vitamin D goal  is between 70-100.  Please make sure that you are taking your Vitamin D as directed.  It is very important as a natural anti-inflammatory  helping hair, skin, and nails, as well as reducing stroke and heart attack risk.  It helps your bones and helps with mood. It also decreases numerous cancer risks so please take it as directed.  Low Vit D is associated with a 200-300% higher risk for CANCER  and 200-300% higher risk for HEART   ATTACK  &  STROKE.   .....................................Marland Kitchen It is also associated with higher death rate at younger ages,  autoimmune diseases like Rheumatoid arthritis, Lupus, Multiple Sclerosis.    Also many other serious conditions, like depression, Alzheimer's Dementia, infertility, muscle aches, fatigue, fibromyalgia - just to name a few. ++++++++++++++++++++ Recommend the book "The END of DIETING" by Dr Excell Seltzer  & the book "The END of DIABETES " by Dr Excell Seltzer At [email or web address] - get book & Audio CD's    Being  diabetic has a  300% increased risk for heart attack, stroke, cancer, and alzheimer- type vascular dementia. It is very important that you work harder with diet by avoiding all foods that are white. Avoid white rice (brown & wild rice is OK), white potatoes (sweetpotatoes in moderation is OK), White bread or wheat bread or anything made out of white flour like bagels, donuts, rolls, buns, biscuits, cakes, pastries, cookies, pizza crust, and pasta (made from white flour & egg whites) - vegetarian pasta or spinach or wheat pasta is OK. Multigrain breads like Arnold's or Pepperidge Farm, or multigrain sandwich thins or flatbreads.  Diet, exercise and weight loss can reverse and cure diabetes in the early stages.  Diet, exercise and weight loss is very important in the control and prevention of complications of diabetes which affects every system in your body, ie. Brain - dementia/stroke, eyes - glaucoma/blindness, heart - heart attack/heart failure, kidneys - dialysis, stomach - gastric paralysis, intestines - malabsorption, nerves - severe painful neuritis, circulation - gangrene & loss of a leg(s), and finally cancer and Alzheimers.    I recommend avoid fried & greasy foods,  sweets/candy,  white rice (brown or wild rice or Quinoa is OK), white potatoes (sweet potatoes are OK) - anything made from white flour - bagels, doughnuts, rolls, buns, biscuits,white and wheat breads, pizza crust and traditional pasta made of white flour & egg white(vegetarian pasta or spinach or wheat pasta is OK).  Multi-grain bread is OK - like multi-grain flat bread or sandwich thins. Avoid alcohol in excess. Exercise is also important.    Eat all the vegetables you want - avoid meat, especially red meat and dairy - especially cheese.  Cheese is the most concentrated form of trans-fats which is the worst thing to clog up our arteries. Veggie cheese is OK which can be found in the fresh produce section at [employer] or [employer] or  [employer]  +++++++++++++++++++++ DASH Eating Plan  DASH stands for "Dietary Approaches to Stop Hypertension."   The DASH eating plan is a healthy eating plan that has been shown to reduce high blood pressure (hypertension). Additional health benefits may include reducing the risk of type 2 diabetes mellitus, heart disease, and stroke. The DASH eating plan may also help with weight loss. WHAT DO I NEED TO KNOW ABOUT THE DASH EATING PLAN? For the DASH eating plan, you will follow these general guidelines:  Choose foods with a percent daily value for sodium of less than 5% (as listed on the food label).  Use salt-free seasonings or herbs instead of table salt or sea salt.  Check with your health care provider or pharmacist before using salt substitutes.  Eat lower-sodium products, often labeled as "lower sodium" or "no salt added."  Eat fresh foods.  Eat more vegetables, fruits, and low-fat dairy products.  Choose whole grains. Look for the word "whole" as the first word in the ingredient list.  Choose fish   Limit sweets, desserts, sugars, and sugary drinks.  Choose heart-healthy fats.  Eat veggie cheese   Eat more home-cooked food and less restaurant, buffet, and fast food.  Limit fried foods.  Cook foods using methods other than frying.  Limit canned vegetables. If you do use them, rinse them well to decrease the sodium.  When eating at a restaurant, ask that your food be prepared with less salt, or no salt if possible.                      WHAT FOODS CAN I EAT? Read Dr Fara Olden Fuhrman's books on The End of Dieting & The End of Diabetes  Grains Whole grain or whole wheat bread. Brown rice. Whole grain or whole wheat pasta. Quinoa, bulgur, and whole grain cereals. Low-sodium cereals. Corn or whole wheat flour tortillas. Whole grain cornbread. Whole grain crackers. Low-sodium crackers.  Vegetables Fresh or frozen vegetables (raw, steamed, roasted, or grilled). Low-sodium  or reduced-sodium tomato and vegetable juices. Low-sodium or reduced-sodium tomato sauce and paste. Low-sodium or reduced-sodium canned vegetables.   Fruits All fresh, canned (in natural juice), or frozen fruits.  Protein Products  All fish and seafood.  Dried beans, peas, or lentils. Unsalted nuts and seeds. Unsalted canned beans.  Dairy Low-fat dairy products, such as skim or 1% milk, 2% or reduced-fat cheeses, low-fat ricotta or cottage cheese, or plain low-fat yogurt. Low-sodium or reduced-sodium cheeses.  Fats and Oils Tub margarines without trans fats. Light or reduced-fat mayonnaise and salad dressings (reduced sodium). Avocado. Safflower, olive, or canola oils. Natural peanut or almond butter.  Other Unsalted popcorn and pretzels. The items listed above may not be a  complete list of recommended foods or beverages. Contact your dietitian for more options.  +++++++++++++++  WHAT FOODS ARE NOT RECOMMENDED? Grains/ White flour or wheat flour White bread. White pasta. White rice. Refined cornbread. Bagels and croissants. Crackers that contain trans fat.  Vegetables  Creamed or fried vegetables. Vegetables in a . Regular canned vegetables. Regular canned tomato sauce and paste. Regular tomato and vegetable juices.  Fruits Dried fruits. Canned fruit in light or heavy syrup. Fruit juice.  Meat and Other Protein Products Meat in general - RED meat & White meat.  Fatty cuts of meat. Ribs, chicken wings, all processed meats as bacon, sausage, bologna, salami, fatback, hot dogs, bratwurst and packaged luncheon meats.  Dairy Whole or 2% milk, cream, half-and-half, and cream cheese. Whole-fat or sweetened yogurt. Full-fat cheeses or blue cheese. Non-dairy creamers and whipped toppings. Processed cheese, cheese spreads, or cheese curds.  Condiments Onion and garlic salt, seasoned salt, table salt, and sea salt. Canned and packaged gravies. Worcestershire sauce. Tartar sauce. Barbecue  sauce. Teriyaki sauce. Soy sauce, including reduced sodium. Steak sauce. Fish sauce. Oyster sauce. Cocktail sauce. Horseradish. Ketchup and mustard. Meat flavorings and tenderizers. Bouillon cubes. Hot sauce. Tabasco sauce. Marinades. Taco seasonings. Relishes.  Fats and Oils Butter, stick margarine, lard, shortening and bacon fat. Coconut, palm kernel, or palm oils. Regular salad dressings.  Pickles and olives. Salted popcorn and pretzels.  The items listed above may not be a complete list of foods and beverages to avoid.

## 2017-06-03 ENCOUNTER — Encounter: Payer: Self-pay | Admitting: Internal Medicine

## 2017-06-03 DIAGNOSIS — Z85038 Personal history of other malignant neoplasm of large intestine: Secondary | ICD-10-CM | POA: Diagnosis not present

## 2017-06-03 DIAGNOSIS — K219 Gastro-esophageal reflux disease without esophagitis: Secondary | ICD-10-CM | POA: Diagnosis not present

## 2017-06-16 ENCOUNTER — Ambulatory Visit (INDEPENDENT_AMBULATORY_CARE_PROVIDER_SITE_OTHER): Payer: Medicare Other

## 2017-06-16 ENCOUNTER — Encounter (INDEPENDENT_AMBULATORY_CARE_PROVIDER_SITE_OTHER): Payer: Self-pay | Admitting: Orthopedic Surgery

## 2017-06-16 ENCOUNTER — Ambulatory Visit (INDEPENDENT_AMBULATORY_CARE_PROVIDER_SITE_OTHER): Payer: Medicare Other | Admitting: Orthopedic Surgery

## 2017-06-16 DIAGNOSIS — M79642 Pain in left hand: Secondary | ICD-10-CM

## 2017-06-17 NOTE — Progress Notes (Signed)
Office Visit Note   Patient: Nancy Myers           Date of Birth: 1946/05/18           MRN: [id number] Visit Date: 06/16/2017 Requested by: Unk Pinto, MD [address] [place], [place] [address] PCP: Unk Pinto, MD  Subjective: Chief Complaint  Patient presents with  . Left Hand - Pain    CWC:BJSEGBT is a 71 year old female with left hand pain 1 month duration.  She describes triggering of these long finger.  She is unable to make a fist.  She reports popping in the hand and localizes the pain to the A1 pulley              ROS: All systems reviewed are negative as they relate to the chief complaint within the history of present illness.  Patient denies  fevers or chills.   Assessment & Plan: Visit Diagnoses:  1. Pain in left hand     Plan: impression is left hand long finger triggering.  Plan is splinting with Band-Aids of the PIP joint.  This will diminish some of the PIP flexion to see if that will help to keep this from progressing.  If that doesn't help then she should come back and consider injection into the A1 pulley.  I will see her back as needed.  Follow-Up Instructions: Return if symptoms worsen or fail to improve.   Orders:  Orders Placed This Encounter  Procedures  . XR Hand Complete Left   No orders of the defined types were placed in this encounter.     Procedures: No procedures performed   Clinical Data: No additional findings.  Objective: Vital Signs: There were no vitals taken for this visit.  Physical Exam:   Constitutional: Patient appears well-developed HEENT:  Head: Normocephalic Eyes:EOM are normal Neck: Normal range of motion Cardiovascular: Normal rate Pulmonary/chest: Effort normal Neurologic: Patient is alert Skin: Skin is warm Psychiatric: Patient has normal mood and affect    Ortho Exam: orthopedic exam demonstrates full active and passive range of motion of all the fingers except for the  long finger.  EPL FPL function is intact.  Tenderness is present over the A1 pulley of the long finger.  Specialty Comments:  No specialty comments available.  Imaging: Xr Hand Complete Left  Result Date: 06/17/2017 AP lateral oblique left hand reviewed.  No fracture seen.  Mild degenerative changes present.  Carpal radiocarpal alignment normal    PMFS History: Patient Active Problem List   Diagnosis Date Noted  . Encounter for Medicare annual wellness exam 03/08/2016  . Medication management 03/31/2015  . B12 deficiency 12/22/2014  . History of colon cancer, no staging 12/22/2014  . Noncompliance with medication regimen 08/24/2014  . Scintillating scotoma of both eyes 08/16/2013  . Hyperlipidemia   . 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 GERD (gastroesophageal reflux disease)   . IBS (irritable bowel syndrome)   . Vitamin D deficiency   . Anemia 08/08/2009  . Depression, major, in remission ([place]) 08/08/2009  . Essential hypertension 08/08/2009  . Asthmatic bronchitis without complication 51/76/1607   Past Medical History:  Diagnosis Date  . Anemia   . Anxiety   . Arrhythmia   . Cancer [facility]) 1995   Colon Cancer  . Depression   . Diabetes mellitus without complication ([place])   . GERD (gastroesophageal reflux disease)   . Hyperlipidemia   . Hypertension   . IBS (irritable bowel syndrome)   .  Vitamin D deficiency     Family History  Problem Relation Age of Onset  . Heart disease Father        MI  . Stroke Mother   . Rheum arthritis Mother   . Heart attack Brother        half brother  . Heart attack Maternal Uncle        half maternal uncle  . Breast cancer Neg Hx     Past Surgical History:  Procedure Laterality Date  . ANTERIOR CRUCIATE LIGAMENT REPAIR Right 04-09-04   right knee  . BUNIONECTOMY Bilateral 02/1987  . BUNIONECTOMY Right 03-08-08   right foot  . CARPAL TUNNEL RELEASE Bilateral 09-30-01;10-21-01  . CATARACT EXTRACTION W/ INTRAOCULAR LENS   IMPLANT, BILATERAL Bilateral 12-07-09;02-15-10  . left foot  05-23-05  . left knee  07-26-02  . SHOULDER SURGERY Left    Social History   Occupational History  . 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 Smoking status: Former Smoker    Types: Cigarettes    Quit date: 09/23/1986  . Smokeless tobacco: Never Used  . Alcohol use No  . Drug use: No  . Sexual activity: Not on file

## 2017-06-18 NOTE — Progress Notes (Deleted)
MEDICARE ANNUAL WELLNESS VISIT AND FOLLOW UP  Assessment:    Over 30 minutes of exam, counseling, chart review, and critical decision making was performed  Plan:   During the course of the visit the patient was educated and counseled about appropriate screening and preventive services including:    Pneumococcal vaccine   Influenza vaccine  Td vaccine  Screening electrocardiogram  Screening mammography  Bone densitometry screening  Colorectal cancer screening  Diabetes screening  Glaucoma screening  Nutrition counseling   Advanced directives: given info/requested   Subjective:   Nancy Myers is a 71 y.o. female who presents for Medicare Annual Wellness Visit and 3 month follow up on hypertension, diabetes, hyperlipidemia, vitamin D def.   Her blood pressure has been controlled at home, today their BP is   She does workout. She denies chest pain, dizziness. She has chronic shortness of breath with exertion.  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  She follows with Dr. Wynonia Lawman for history of CP/SOB, on bASA, no CV complaints.  She is on cholesterol medication and denies myalgias. Her cholesterol is not at goal less than 70. The cholesterol last visit was:   Lab Results  Component Value Date   CHOL 236 (H) 03/10/2017   HDL 39 (L) 03/10/2017   LDLCALC 149 (H) 03/10/2017   TRIG 241 (H) 03/10/2017   CHOLHDL 6.1 (H) 03/10/2017  She has been working on diet and exercise for Diabetes with diabetic polyneuropathy, she is on bASA, she is on ACE/ARB, and denies polydipsia, polyuria and visual disturbances. Last A1C was:  Lab Results  Component Value Date   HGBA1C 7.1 (H) 03/10/2017   Lab Results  Component Value Date   GFRAA >89 03/10/2017   Patient is on Vitamin D supplement. Lab Results  Component Value Date   VD25OH 24 (L) 03/10/2017   She has asthma and allergies, follows with Dr. Annamaria Boots, she is on albuterol and last PFT was 09/2013.  She is on  prozac for depression/anxiety.  She has b12 def and is on b12 shots.    Medication Review Current Outpatient Prescriptions on File Prior to Visit  Medication Sig Dispense Refill  . acyclovir (ZOVIRAX) 400 MG tablet Take 1 pill two times daily as needed 60 tablet 0  . albuterol (PROAIR HFA) 108 (90 Base) MCG/ACT inhaler Inhale two puffs every four to six hours as needed for cough or wheeze.    Marland Kitchen amLODipine (NORVASC) 5 MG tablet Take 1 tablet (5 mg total) by mouth daily. 90 tablet 1  . aspirin 81 MG chewable tablet Chew 162 mg by mouth daily.     Marland Kitchen atorvastatin (LIPITOR) 40 MG tablet Take 1 tablet (40 mg total) by mouth daily. 90 tablet 1  . Blood Glucose Monitoring Suppl (FREESTYLE FREEDOM LITE) W/DEVICE KIT Check Blood Sugar One Time Daily 1 each PRN  . budesonide (RHINOCORT ALLERGY) 32 MCG/ACT nasal spray Place 1 spray into both nostrils daily as needed for rhinitis.    . budesonide-formoterol (SYMBICORT) 160-4.5 MCG/ACT inhaler Inhale two puffs twice a day to prevent cough or wheeze.  Rinse, gargle, and spit after use. 3 Inhaler 3  . carisoprodol (SOMA) 250 MG tablet Take 1 tablet (250 mg total) by mouth every 6 (six) hours as needed. for pain 180 tablet 3  . Cholecalciferol (VITAMIN D PO) Take 5,000 Units by mouth.    . cyanocobalamin (,VITAMIN B-12,) 1000 MCG/ML injection INJECT 1 ML INTRAMUSCULARLY EVERY 30 DAYS 10 mL 2  .  FLUoxetine (PROZAC) 40 MG capsule Take 1 capsule (40 mg total) by mouth daily. 30 capsule 3  . fluticasone-salmeterol (ADVAIR HFA) 115-21 MCG/ACT inhaler Inhale 2 puffs into the lungs 2 (two) times daily. 1 Inhaler 5  . hydrochlorothiazide (HYDRODIURIL) 25 MG tablet Take 1 tablet (25 mg total) by mouth daily. 90 tablet 1  . ipratropium (ATROVENT) 0.02 % nebulizer solution Take 0.5 mg by nebulization every 4 (four) hours as needed for wheezing or shortness of breath.    . Lancets (FREESTYLE) lancets Test Blood Sugar One Time Daily 100 each PRN  . losartan (COZAAR) 100 MG  tablet Take 100 mg by mouth daily.    . Magnesium 500 MG TABS Take by mouth.    . metFORMIN (GLUCOPHAGE) 500 MG tablet Take 2 tablets (1,000 mg total) by mouth daily with breakfast. 180 tablet 1  . metoprolol succinate (TOPROL-XL) 100 MG 24 hr tablet Take 1 tablet (100 mg total) by mouth daily. 90 tablet 1  . mometasone (ELOCON) 0.1 % ointment Apply to affected skin once daily as directed. 45 g 3  . omeprazole (PRILOSEC) 40 MG capsule Take 1 capsule (40 mg total) by mouth every morning. (Patient taking differently: Take 20 mg by mouth every morning. ) 90 capsule 3  . ranitidine (ZANTAC) 300 MG capsule Take 1 capsule (300 mg total) by mouth every evening. (Patient taking differently: Take 150 mg by mouth every evening. ) 90 capsule 3  . valsartan (DIOVAN) 320 MG tablet 1/2-1 tablet daily 30 tablet 1  . vitamin C (ASCORBIC ACID) 500 MG tablet Take 500 mg by mouth daily.     No current facility-administered medications on file prior to visit.     Current Problems (verified) Patient Active Problem List   Diagnosis Date Noted  . Encounter for Medicare annual wellness exam 03/08/2016  . Medication management 03/31/2015  . B12 deficiency 12/22/2014  . History of colon cancer, no staging 12/22/2014  . Noncompliance with medication regimen 08/24/2014  . Scintillating scotoma of both eyes 08/16/2013  . Hyperlipidemia   . 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 GERD (gastroesophageal reflux disease)   . IBS (irritable bowel syndrome)   . Vitamin D deficiency   . Anemia 08/08/2009  . Depression, major, in remission ([place]) 08/08/2009  . Essential hypertension 08/08/2009  . Asthmatic bronchitis without complication 16/94/5038   Screening Tests Immunization History  Administered Date(s) Administered  . Influenza Split 06/23/2013  . Influenza, High Dose Seasonal PF 08/01/2014, 07/19/2015, 06/10/2016  . Pneumococcal Conjugate-13 09/21/2014  . Pneumococcal-Unspecified 09/03/2012  . Td  10/03/2010   Preventative care: Tetanus: 2012  Pneumovax:2013  Prevnar 13: 2015 Flu vaccine: 2016  Zostavax: declines due to cost  Pap: 2010  Declines another MGM: 11/2015 DEXA: declines Colonoscopy: 2013  Dr. Cecelia Byars this year in Oct EGD: N/A FTs 09/2013 CT pelvis 01/2012 CXR 07/2013 Stress test 2014 Echo 08/2013  Names of Other Physician/Practitioners you currently use: 1.  [facility]- here for primary care 2. Dr. Claudean Kinds, eye doctor, last visit 10/2015 3. Dr. Deatra Ina, dentist, last visit 2 years Patient Care Team: Unk Pinto, MD as PCP - General (Internal Medicine) Laurence Spates, MD as Consulting Physician (Gastroenterology) Debara Pickett Nadean Corwin, MD as Consulting Physician (Cardiology) Deneise Lever, MD as Consulting Physician (Pulmonary Disease) Dr. Marlou Sa, ortho for left shoulder  Allergies Allergies  Allergen Reactions  . Codeine Itching    SURGICAL HISTORY She  has a past surgical history that includes Bunionectomy (Bilateral,  02/1987); Carpal tunnel release (Bilateral, 09-30-01;10-21-01); Anterior cruciate ligament repair (Right, 04-09-04); left knee (07-26-02); left foot (05-23-05); Bunionectomy (Right, 03-08-08); Cataract extraction w/ intraocular lens  implant, bilateral (Bilateral, 12-07-09;02-15-10); and Shoulder surgery (Left). FAMILY HISTORY Her family history includes Heart attack in her brother and maternal uncle; Heart disease in her father; Rheum arthritis in her mother; Stroke in her mother. SOCIAL HISTORY She  reports that she quit smoking about 30 years ago. Her smoking use included Cigarettes. She has never used smokeless tobacco. She reports that she does not drink alcohol or use drugs.  MEDICARE WELLNESS OBJECTIVES: Tobacco use: She does not smoke.  Patient is a former smoker. Alcohol Current alcohol use: none Caffeine Current caffeine use: coffee 1-2 /day Osteoporosis: postmenopausal estrogen deficiency and  dietary calcium and/or vitamin D deficiency, History of fracture in the past year: no Diet: in general, a "healthy" diet   Physical activity: yard work and walking Depression/mood screen:  Yes - No Depression Hearing: normal Visual acuity: normal,  does perform annual eye exam  ADLs: self care Fall risk: Low Risk Home safety: good Cognitive Testing  Alert? Yes  Normal Appearance?Yes  Oriented to person? Yes  Place? Yes   Time? Yes  Recall of three objects?  Yes  Can perform simple calculations? Yes  Displays appropriate judgment?Yes  Can read the correct time from a watch face?Yes EOL planning: No  and Information given   Objective:   There were no vitals taken for this visit. There is no height or weight on file to calculate BMI.  General appearance: alert, no distress, WD/WN,  female HEENT: normocephalic, sclerae anicteric, TMs pearly, nares patent, no discharge or erythema, pharynx normal Oral cavity: MMM, no lesions Neck: supple, no lymphadenopathy, no thyromegaly, no masses Heart: RRR, normal S1, S2, no murmurs Lungs: CTA bilaterally, no wheezes, rhonchi, or rales Abdomen: +bs, soft, non tender, non distended, no masses, no hepatomegaly, no splenomegaly Musculoskeletal: nontender, no swelling, no obvious deformity Extremities: no edema, no cyanosis, no clubbing Pulses: 2+ symmetric, upper and lower extremities, normal cap refill Neurological: alert, oriented x 3, CN2-12 intact, strength normal upper extremities and lower extremities, sensation decreased bilateral feet, DTRs 2+ throughout, no cerebellar signs, gait normal Psychiatric: normal affect, behavior normal, pleasant  Breast: defer Gyn: defer Rectal: defer  Medicare Attestation I have personally reviewed: The patient's medical and social history Their use of alcohol, tobacco or illicit drugs Their current medications and supplements The patient's functional ability including ADLs,fall risks, home safety  risks, cognitive, and hearing and visual impairment Diet and physical activities Evidence for depression or mood disorders  The patient's weight, height, BMI, and visual acuity have been recorded in the chart.  I have made referrals, counseling, and provided education to the patient based on review of the above and I have provided the patient with a written personalized care plan for preventive services.     Vicie Mutters, PA-C   06/18/2017

## 2017-06-20 ENCOUNTER — Encounter: Payer: Self-pay | Admitting: Physician Assistant

## 2017-06-20 ENCOUNTER — Ambulatory Visit (INDEPENDENT_AMBULATORY_CARE_PROVIDER_SITE_OTHER): Payer: Medicare Other | Admitting: Physician Assistant

## 2017-06-20 ENCOUNTER — Ambulatory Visit: Payer: Self-pay | Admitting: Physician Assistant

## 2017-06-20 VITALS — BP 130/76 | HR 71 | Temp 97.4°F | Resp 14 | Ht 60.0 in | Wt 145.2 lb

## 2017-06-20 DIAGNOSIS — Z9114 Patient's other noncompliance with medication regimen: Secondary | ICD-10-CM | POA: Diagnosis not present

## 2017-06-20 DIAGNOSIS — Z23 Encounter for immunization: Secondary | ICD-10-CM | POA: Diagnosis not present

## 2017-06-20 DIAGNOSIS — J45909 Unspecified asthma, uncomplicated: Secondary | ICD-10-CM

## 2017-06-20 DIAGNOSIS — E1149 Type 2 diabetes mellitus with other diabetic neurological complication: Secondary | ICD-10-CM | POA: Diagnosis not present

## 2017-06-20 DIAGNOSIS — Z79899 Other long term (current) drug therapy: Secondary | ICD-10-CM

## 2017-06-20 DIAGNOSIS — K589 Irritable bowel syndrome without diarrhea: Secondary | ICD-10-CM

## 2017-06-20 DIAGNOSIS — E785 Hyperlipidemia, unspecified: Secondary | ICD-10-CM

## 2017-06-20 DIAGNOSIS — E663 Overweight: Secondary | ICD-10-CM

## 2017-06-20 DIAGNOSIS — Z7189 Other specified counseling: Secondary | ICD-10-CM

## 2017-06-20 DIAGNOSIS — D509 Iron deficiency anemia, unspecified: Secondary | ICD-10-CM

## 2017-06-20 DIAGNOSIS — F325 Major depressive disorder, single episode, in full remission: Secondary | ICD-10-CM

## 2017-06-20 DIAGNOSIS — K2100 Gastro-esophageal reflux disease with esophagitis, without bleeding: Secondary | ICD-10-CM

## 2017-06-20 DIAGNOSIS — E559 Vitamin D deficiency, unspecified: Secondary | ICD-10-CM

## 2017-06-20 DIAGNOSIS — E538 Deficiency of other specified B group vitamins: Secondary | ICD-10-CM

## 2017-06-20 DIAGNOSIS — H5319 Other subjective visual disturbances: Secondary | ICD-10-CM

## 2017-06-20 DIAGNOSIS — Z85038 Personal history of other malignant neoplasm of large intestine: Secondary | ICD-10-CM

## 2017-06-20 DIAGNOSIS — H53123 Transient visual loss, bilateral: Secondary | ICD-10-CM

## 2017-06-20 DIAGNOSIS — Z0001 Encounter for general adult medical examination with abnormal findings: Secondary | ICD-10-CM

## 2017-06-20 DIAGNOSIS — Z0000 Encounter for general adult medical examination without abnormal findings: Secondary | ICD-10-CM

## 2017-06-20 DIAGNOSIS — R6889 Other general symptoms and signs: Secondary | ICD-10-CM | POA: Diagnosis not present

## 2017-06-20 DIAGNOSIS — I10 Essential (primary) hypertension: Secondary | ICD-10-CM | POA: Diagnosis not present

## 2017-06-20 NOTE — Patient Instructions (Addendum)
Cervical Strain and Sprain Rehab Ask your health care provider which exercises are safe for you. Do exercises exactly as told by your health care provider and adjust them as directed. It is normal to feel mild stretching, pulling, tightness, or discomfort as you do these exercises, but you should stop right away if you feel sudden pain or your pain gets worse.Do not begin these exercises until told by your health care provider. Stretching and range of motion exercises These exercises warm up your muscles and joints and improve the movement and flexibility of your neck. These exercises also help to relieve pain, numbness, and tingling. Exercise A: Cervical side bend  1. Using good posture, sit on a stable chair or stand up. 2. Without moving your shoulders, slowly tilt your left / right ear to your shoulder until you feel a stretch in your neck muscles. You should be looking straight ahead. 3. Hold for __________ seconds. 4. Repeat with the other side of your neck. Repeat __________ times. Complete this exercise __________ times a day. Exercise B: Cervical rotation  1. Using good posture, sit on a stable chair or stand up. 2. Slowly turn your head to the side as if you are looking over your left / right shoulder. ? Keep your eyes level with the ground. ? Stop when you feel a stretch along the side and the back of your neck. 3. Hold for __________ seconds. 4. Repeat this by turning to your other side. Repeat __________ times. Complete this exercise __________ times a day. Exercise C: Thoracic extension and pectoral stretch 1. Roll a towel or a small blanket so it is about 4 inches (10 cm) in diameter. 2. Lie down on your back on a firm surface. 3. Put the towel lengthwise, under your spine in the middle of your back. It should not be not under your shoulder blades. The towel should line up with your spine from your middle back to your lower back. 4. Put your hands behind your head and let your  elbows fall out to your sides. 5. Hold for __________ seconds. Repeat __________ times. Complete this exercise __________ times a day. Strengthening exercises These exercises build strength and endurance in your neck. Endurance is the ability to use your muscles for a long time, even after your muscles get tired. Exercise D: Upper cervical flexion, isometric 1. Lie on your back with a thin pillow behind your head and a small rolled-up towel under your neck. 2. Gently tuck your chin toward your chest and nod your head down to look toward your feet. Do not lift your head off the pillow. 3. Hold for __________ seconds. 4. Release the tension slowly. Relax your neck muscles completely before you repeat this exercise. Repeat __________ times. Complete this exercise __________ times a day. Exercise E: Cervical extension, isometric  1. Stand about 6 inches (15 cm) away from a wall, with your back facing the wall. 2. Place a soft object, about 6-8 inches (15-20 cm) in diameter, between the back of your head and the wall. A soft object could be a small pillow, a ball, or a folded towel. 3. Gently tilt your head back and press into the soft object. Keep your jaw and forehead relaxed. 4. Hold for __________ seconds. 5. Release the tension slowly. Relax your neck muscles completely before you repeat this exercise. Repeat __________ times. Complete this exercise __________ times a day. Posture and body mechanics  Body mechanics refers to the movements and positions of   your body while you do your daily activities. Posture is part of body mechanics. Good posture and healthy body mechanics can help to relieve stress in your body's tissues and joints. Good posture means that your spine is in its natural S-curve position (your spine is neutral), your shoulders are pulled back slightly, and your head is not tipped forward. The following are general guidelines for applying improved posture and body mechanics to  your everyday activities. Standing  When standing, keep your spine neutral and keep your feet about hip-width apart. Keep a slight bend in your knees. Your ears, shoulders, and hips should line up.  When you do a task in which you stand in one place for a long time, place one foot up on a stable object that is 2-4 inches (5-10 cm) high, such as a footstool. This helps keep your spine neutral. Sitting   When sitting, keep your spine neutral and your keep feet flat on the floor. Use a footrest, if necessary, and keep your thighs parallel to the floor. Avoid rounding your shoulders, and avoid tilting your head forward.  When working at a desk or a computer, keep your desk at a height where your hands are slightly lower than your elbows. Slide your chair under your desk so you are close enough to maintain good posture.  When working at a computer, place your monitor at a height where you are looking straight ahead and you do not have to tilt your head forward or downward to look at the screen. Resting When lying down and resting, avoid positions that are most painful for you. Try to support your neck in a neutral position. You can use a contour pillow or a small rolled-up towel. Your pillow should support your neck but not push on it. This information is not intended to replace advice given to you by your health care provider. Make sure you discuss any questions you have with your health care provider. Document Released: 09/09/2005 Document Revised: 05/16/2016 Document Reviewed: 08/16/2015 Elsevier Interactive Patient Education  2018 [place] A1C is a measure of your sugar over the past 3 months and is not affected by what you have eaten over the past few days. Diabetes increases your chances of stroke and heart attack over 300 % and is the leading cause of blindness and kidney failure in the [place]. Please make sure you decrease bad carbs like white bread, white rice, potatoes,  corn, soft drinks, pasta, cereals, refined sugars, sweet tea, dried fruits, and fruit juice. Good carbs are okay to eat in moderation like sweet potatoes, brown rice, whole grain pasta/bread, most fruit (except dried fruit) and you can eat as many veggies as you want.   Greater than 6.5 is considered diabetic. Between 6.4 and 5.7 is prediabetic If your A1C is less than 5.7 you are NOT diabetic.  Targets for Glucose Readings: Time of Check Target for patients WITHOUT Diabetes Target for DIABETICS  Before Meals Less than 100  less than 150  Two hours after meals Less than 200  Less than 250       Bad carbs also include fruit juice, alcohol, and sweet tea. These are empty calories that do not signal to your brain that you are full.   Please remember the good carbs are still carbs which convert into sugar. So please measure them out no more than 1/2-1 cup of rice, oatmeal, pasta, and beans  Veggies are however free foods! Pile  them on.   Not all fruit is created equal. Please see the list below, the fruit at the bottom is higher in sugars than the fruit at the top. Please avoid all dried fruits.

## 2017-06-20 NOTE — Progress Notes (Signed)
MEDICARE ANNUAL WELLNESS VISIT AND FOLLOW UP  Assessment:   Encounter for Medicare annual wellness exam 1 year follow up  Needs flu shot -     Flu vaccine HIGH DOSE PF  Essential hypertension - continue medications, DASH diet, exercise and monitor at home. Call if greater than 130/80.  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 -     Hemoglobin A1c - start to check sugars 2 x a week  Hyperlipidemia, unspecified hyperlipidemia type -continue medications, check lipids, decrease fatty foods, increase activity.  -     Lipid panel  Depression, major, in remission ([place]) Continue meds  Asthmatic bronchitis without complication, unspecified asthma severity, unspecified whether persistent Continue inhalers  Irritable bowel syndrome, unspecified type Increase fiber, diet discussed  Gastroesophageal reflux disease with esophagitis Continue PPI/H2 blocker, diet discussed  Vitamin D deficiency Continue supplement  Scintillating scotoma of both eyes Continue eye doc follow up  Noncompliance with medication regimen Emphasized compliance.   Medication management -     Magnesium  History of colon cancer, no staging UTD   B12 deficiency B12 injections  Iron deficiency anemia, unspecified iron deficiency anemia type Continue iron  Advanced care counseling/discussion Discussed advanced care planning with patient, given papers, will discuss with daughter here and in [place] and bring back papers At least 30 mins discussed with the patient     Overweight BMI 28 - long discussion about weight loss, diet, and exercise -recommended diet  heavy in fruits and veggies and low in animal meats, cheeses, and dairy products   Over 30 minutes of exam, counseling, chart review, and critical decision making was performed Future Appointments Date Time Provider [place]  09/24/2017 10:30 AM Unk Pinto, MD [facility] None  10/23/2017 10:00 AM Vicie Mutters, PA-C [facility] None    Plan:   During the course of the visit the patient was educated and counseled about appropriate screening and preventive services including:    Pneumococcal vaccine   Influenza vaccine  Td vaccine  Screening electrocardiogram  Screening mammography  Bone densitometry screening  Colorectal cancer screening  Diabetes screening  Glaucoma screening  Nutrition counseling   Advanced directives: given info/requested   Subjective:   Nancy Myers is a 71 y.o. female who presents for Medicare Annual Wellness Visit and 3 month follow up on hypertension, diabetes, hyperlipidemia, vitamin D def.   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  Her blood pressure has been controlled at home, today their BP is BP: 130/76 She does workout. She denies chest pain, dizziness. She has chronic shortness of breath with exertion.  She follows with Dr. Wynonia Lawman for history of CP/SOB, on bASA, no CV complaints.  She is on cholesterol medication, suppose to be on lipitor 40.Marland Kitchen Her cholesterol is not at goal less than 70. The cholesterol last visit was:   Lab Results  Component Value Date   CHOL 236 (H) 03/10/2017   HDL 39 (L) 03/10/2017   LDLCALC 149 (H) 03/10/2017   TRIG 241 (H) 03/10/2017   CHOLHDL 6.1 (H) 03/10/2017  She has been working on diet and exercise for Diabetes with diabetic polyneuropathy, does not check your insurance, she is on bASA, she is on ACE/ARB, and denies polydipsia, polyuria and visual disturbances.  Last A1C was:  Lab Results  Component Value Date   HGBA1C 7.1 (H) 03/10/2017   Lab Results  Component Value Date   GFRAA >89 03/10/2017   Patient is on Vitamin D supplement. Lab Results  Component Value Date   VD25OH 24 (L) 03/10/2017   She has asthma and allergies, follows with Dr. Annamaria Boots, she is on albuterol and last PFT was 09/2013.  She is on prozac for depression/anxiety.  She has b12 def and is on b12 shots.   BMI is Body mass index is 28.36 kg/m., she is working on diet and exercise. Wt Readings from Last 3 Encounters:  06/20/17 145 lb 3.2 oz (65.9 kg)  04/09/17 144 lb 3.2 oz (65.4 kg)  03/24/17 146 lb 12.8 oz (66.6 kg)     Medication Review Current Outpatient Prescriptions on File Prior to Visit  Medication Sig Dispense Refill  . acyclovir (ZOVIRAX) 400 MG tablet Take 1 pill two times daily as needed 60 tablet 0  . albuterol (PROAIR HFA) 108 (90 Base) MCG/ACT inhaler Inhale two puffs every four to six hours as needed for cough or wheeze.    Marland Kitchen amLODipine (NORVASC) 5 MG tablet Take 1 tablet (5 mg total) by mouth daily. 90 tablet 1  . aspirin 81 MG chewable tablet Chew 162 mg by mouth daily.     Marland Kitchen atorvastatin (LIPITOR) 40 MG tablet Take 1 tablet (40 mg total) by mouth daily. 90 tablet 1  . Blood Glucose Monitoring Suppl (FREESTYLE FREEDOM LITE) W/DEVICE KIT Check Blood Sugar One Time Daily 1 each PRN  . budesonide (RHINOCORT ALLERGY) 32 MCG/ACT nasal spray Place 1 spray into both nostrils daily as needed for rhinitis.    . budesonide-formoterol (SYMBICORT) 160-4.5 MCG/ACT inhaler Inhale two puffs twice a day to prevent cough or wheeze.  Rinse, gargle, and spit after use. 3 Inhaler 3  . carisoprodol (SOMA) 250 MG tablet Take 1 tablet (250 mg total) by mouth every 6 (six) hours as needed. for pain 180 tablet 3  . Cholecalciferol (VITAMIN D PO) Take 5,000 Units by mouth.    . cyanocobalamin (,VITAMIN B-12,) 1000 MCG/ML injection INJECT 1 ML INTRAMUSCULARLY EVERY 30 DAYS 10 mL  2  . FLUoxetine (PROZAC) 40 MG capsule Take 1 capsule (40 mg total) by mouth daily. 30 capsule 3  . fluticasone-salmeterol (ADVAIR HFA) 115-21 MCG/ACT inhaler Inhale 2 puffs into the lungs 2 (two) times daily. 1 Inhaler 5  . hydrochlorothiazide (HYDRODIURIL) 25 MG tablet Take 1 tablet (25 mg total) by mouth daily. 90 tablet 1  . ipratropium (ATROVENT) 0.02 % nebulizer solution Take 0.5 mg by nebulization every 4 (four) hours as needed for wheezing or shortness of breath.    . Lancets (FREESTYLE) lancets Test Blood Sugar One Time Daily 100 each PRN  . losartan (COZAAR) 100 MG tablet Take 100 mg by mouth daily.    . Magnesium 500 MG TABS Take by mouth.    . metFORMIN (GLUCOPHAGE) 500 MG tablet Take 2 tablets (1,000 mg total) by mouth daily with breakfast. 180 tablet 1  . metoprolol succinate (TOPROL-XL) 100 MG 24 hr tablet Take 1 tablet (100 mg total) by mouth daily. 90 tablet 1  . mometasone (ELOCON) 0.1 % ointment Apply to affected skin once daily as directed. 45 g 3  .  omeprazole (PRILOSEC) 40 MG capsule Take 1 capsule (40 mg total) by mouth every morning. (Patient taking differently: Take 20 mg by mouth every morning. ) 90 capsule 3  . ranitidine (ZANTAC) 300 MG capsule Take 1 capsule (300 mg total) by mouth every evening. (Patient taking differently: Take 150 mg by mouth every evening. ) 90 capsule 3  . valsartan (DIOVAN) 320 MG tablet 1/2-1 tablet daily 30 tablet 1  . vitamin C (ASCORBIC ACID) 500 MG tablet Take 500 mg by mouth daily.     No current facility-administered medications on file prior to visit.     Current Problems (verified) Patient Active Problem List   Diagnosis Date Noted  . Encounter for Medicare annual wellness exam 03/08/2016  . Medication management 03/31/2015  . B12 deficiency 12/22/2014  . History of colon cancer, no staging 12/22/2014  . Noncompliance with medication regimen 08/24/2014  . Scintillating scotoma of both eyes 08/16/2013  . Hyperlipidemia   .  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 GERD (gastroesophageal reflux disease)   . IBS (irritable bowel syndrome)   . Vitamin D deficiency   . Anemia 08/08/2009  . Depression, major, in remission ([place]) 08/08/2009  . Essential hypertension 08/08/2009  . Asthmatic bronchitis without complication 95/62/1308   Screening Tests Immunization History  Administered Date(s) Administered  . Influenza Split 06/23/2013  . Influenza, High Dose Seasonal PF 08/01/2014, 07/19/2015, 06/10/2016, 06/20/2017  . Pneumococcal Conjugate-13 09/21/2014  . Pneumococcal-Unspecified 09/03/2012  . Td 10/03/2010   Preventative care: Tetanus: 2012  Pneumovax:2013  Prevnar 13: 2015 Flu vaccine: 2017 Zostavax: declines due to cost  Pap: 09/2015 MGM: 01/2017 DEXA: declines Colonoscopy: 03/2017 Dr. Oletta Lamas EGD: 03/2017 FTs 09/2013 CT pelvis 01/2012 CXR 07/2013 Stress test 2014 Echo 08/2013  Names of Other Physician/Practitioners you currently use: 1. [facility]- here for primary care 2. Dr. Claudean Kinds, eye doctor, last visit 08/2016 3. Dr. Currie Paris, dentist, last visit 2016 Patient Care Team: Unk Pinto, MD as PCP - General (Internal Medicine) Laurence Spates, MD as Consulting Physician (Gastroenterology) Debara Pickett Nadean Corwin, MD as Consulting Physician (Cardiology) Deneise Lever, MD as Consulting Physician (Pulmonary Disease) Dr. Marlou Sa, ortho for left shoulder  Allergies Allergies  Allergen Reactions  . Codeine Itching    SURGICAL HISTORY She  has a past surgical history that includes Bunionectomy (Bilateral, 02/1987); Carpal tunnel release (Bilateral, 09-30-01;10-21-01); Anterior cruciate ligament repair (Right, 04-09-04); left knee (07-26-02); left foot (05-23-05); Bunionectomy (Right, 03-08-08); Cataract extraction w/ intraocular lens  implant, bilateral (Bilateral, 12-07-09;02-15-10); and Shoulder surgery (Left). FAMILY HISTORY Her family history includes  Heart attack in her brother and maternal uncle; Heart disease in her father; Rheum arthritis in her mother; Stroke in her mother. SOCIAL HISTORY She  reports that she quit smoking about 30 years ago. Her smoking use included Cigarettes. She has never used smokeless tobacco. She reports that she does not drink alcohol or use drugs.  MEDICARE WELLNESS OBJECTIVES: Tobacco use: She does not smoke.  Patient is a former smoker. Alcohol Current alcohol use: none Caffeine Current caffeine use: coffee 1-2 /day Osteoporosis: postmenopausal estrogen deficiency and dietary calcium and/or vitamin D deficiency, History of fracture in the past year: no Diet: in general, a "healthy" diet   Physical activity: yard work Depression/mood screen:  Yes - No Depression Hearing: normal Visual acuity: normal,  does perform annual eye exam  ADLs: self care Fall risk: Low Risk Home safety: good Cognitive Testing  Alert? Yes  Normal Appearance?Yes  Oriented  to person? Yes  Place? Yes   Time? Yes  Recall of three objects?  Yes  Can perform simple calculations? Yes  Displays appropriate judgment?Yes  Can read the correct time from a watch face?Yes EOL planning: No  and Information given   Objective:   Blood pressure 130/76, pulse 71, temperature (!) 97.4 F (36.3 C), resp. rate 14, height 5' (1.524 m), weight 145 lb 3.2 oz (65.9 kg), SpO2 97 %. Body mass index is 28.36 kg/m.  General appearance: alert, no distress, WD/WN,  female HEENT: normocephalic, sclerae anicteric, TMs pearly, nares patent, no discharge or erythema, pharynx normal Oral cavity: MMM, no lesions Neck: supple, no lymphadenopathy, no thyromegaly, no masses Heart: RRR, normal S1, S2, no murmurs Lungs: CTA bilaterally, no wheezes, rhonchi, or rales Abdomen: +bs, soft, non tender, non distended, no masses, no hepatomegaly, no splenomegaly Musculoskeletal: nontender, no swelling, no obvious deformity Extremities: no edema, no cyanosis, no  clubbing Pulses: 2+ symmetric, upper and lower extremities, normal cap refill Neurological: alert, oriented x 3, CN2-12 intact, strength normal upper extremities and lower extremities, sensation decreased bilateral feet, DTRs 2+ throughout, no cerebellar signs, gait normal Psychiatric: normal affect, behavior normal, pleasant  Breast: defer Gyn: defer Rectal: defer  Medicare Attestation I have personally reviewed: The patient's medical and social history Their use of alcohol, tobacco or illicit drugs Their current medications and supplements The patient's functional ability including ADLs,fall risks, home safety risks, cognitive, and hearing and visual impairment Diet and physical activities Evidence for depression or mood disorders  The patient's weight, height, BMI, and visual acuity have been recorded in the chart.  I have made referrals, counseling, and provided education to the patient based on review of the above and I have provided the patient with a written personalized care plan for preventive services.     Vicie Mutters, PA-C   06/20/2017

## 2017-06-21 LAB — HEMOGLOBIN A1C
Hgb A1c MFr Bld: 7.1 % of total Hgb — ABNORMAL HIGH (ref <5.7)
Mean Plasma Glucose: 157 (calc)
eAG (mmol/L): 8.7 (calc)

## 2017-06-21 LAB — LIPID PANEL
CHOLESTEROL: 146 mg/dL (ref <200)
HDL: 38 mg/dL — AB (ref >50)
LDL CHOLESTEROL (CALC): 85 mg/dL
Non-HDL Cholesterol (Calc): 108 mg/dL (calc) (ref <130)
TRIGLYCERIDES: 124 mg/dL (ref <150)
Total CHOL/HDL Ratio: 3.8 (calc) (ref <5.0)

## 2017-06-21 LAB — CBC WITH DIFFERENTIAL/PLATELET
BASOS PCT: 0.6 %
Basophils Absolute: 44 cells/uL (ref 0–200)
Eosinophils Absolute: 212 cells/uL (ref 15–500)
Eosinophils Relative: 2.9 %
HCT: 36.2 % (ref 35.0–45.0)
HEMOGLOBIN: 11.9 g/dL (ref 11.7–15.5)
Lymphs Abs: 2183 cells/uL (ref 850–3900)
MCH: 26.9 pg — ABNORMAL LOW (ref 27.0–33.0)
MCHC: 32.9 g/dL (ref 32.0–36.0)
MCV: 81.7 fL (ref 80.0–100.0)
MPV: 10.8 fL (ref 7.5–12.5)
Monocytes Relative: 10.1 %
NEUTROS ABS: 4125 {cells}/uL (ref 1500–7800)
Neutrophils Relative %: 56.5 %
PLATELETS: 288 10*3/uL (ref 140–400)
RBC: 4.43 10*6/uL (ref 3.80–5.10)
RDW: 13.3 % (ref 11.0–15.0)
TOTAL LYMPHOCYTE: 29.9 %
WBC: 7.3 10*3/uL (ref 3.8–10.8)
WBCMIX: 737 {cells}/uL (ref 200–950)

## 2017-06-21 LAB — MAGNESIUM: MAGNESIUM: 1.8 mg/dL (ref 1.5–2.5)

## 2017-06-21 LAB — BASIC METABOLIC PANEL WITH GFR
BUN: 15 mg/dL (ref 7–25)
CALCIUM: 9.1 mg/dL (ref 8.6–10.4)
CHLORIDE: 101 mmol/L (ref 98–110)
CO2: 29 mmol/L (ref 20–32)
Creat: 0.78 mg/dL (ref 0.60–0.93)
GFR, EST AFRICAN AMERICAN: 89 mL/min/{1.73_m2} (ref >=60)
GFR, EST NON AFRICAN AMERICAN: 77 mL/min/{1.73_m2} (ref >=60)
Glucose, Bld: 126 mg/dL — ABNORMAL HIGH (ref 65–99)
Potassium: 3.8 mmol/L (ref 3.5–5.3)
Sodium: 138 mmol/L (ref 135–146)

## 2017-06-21 LAB — HEPATIC FUNCTION PANEL
AG Ratio: 1.5 (calc) (ref 1.0–2.5)
ALBUMIN MSPROF: 4 g/dL (ref 3.6–5.1)
ALT: 10 U/L (ref 6–29)
AST: 15 U/L (ref 10–35)
Alkaline phosphatase (APISO): 107 U/L (ref 33–130)
BILIRUBIN DIRECT: 0.1 mg/dL (ref 0.0–0.2)
GLOBULIN: 2.7 g/dL (ref 1.9–3.7)
Indirect Bilirubin: 0.4 mg/dL (calc) (ref 0.2–1.2)
Total Bilirubin: 0.5 mg/dL (ref 0.2–1.2)
Total Protein: 6.7 g/dL (ref 6.1–8.1)

## 2017-06-21 LAB — TSH: TSH: 1.16 m[IU]/L (ref 0.40–4.50)

## 2017-06-23 NOTE — Progress Notes (Signed)
Pt aware of lab results & voiced understanding of those results.

## 2017-07-16 ENCOUNTER — Ambulatory Visit (INDEPENDENT_AMBULATORY_CARE_PROVIDER_SITE_OTHER): Payer: Medicare Other | Admitting: Orthopedic Surgery

## 2017-07-16 ENCOUNTER — Encounter (INDEPENDENT_AMBULATORY_CARE_PROVIDER_SITE_OTHER): Payer: Self-pay | Admitting: Orthopedic Surgery

## 2017-07-16 DIAGNOSIS — M79642 Pain in left hand: Secondary | ICD-10-CM | POA: Diagnosis not present

## 2017-07-16 NOTE — Progress Notes (Signed)
Office Visit Note   Patient: Nancy Myers           Date of Birth: 26-Jan-1946           MRN: [id number] Visit Date: 07/16/2017 Requested by: Unk Pinto, [address] [place], [place] [address] PCP: Unk Pinto, MD  Subjective: Chief Complaint  Patient presents with  . Left Hand - Pain    HPI: Patient reports left long finger pain.  She has a history of triggering in this finger but it no longer triggers but she can't really bend it down to the palm.  Fingers getting stiff.  Difficult for her to make a fist.  She is right-hand dominant.  She is going to [place] on November 8.  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              ROS: All systems reviewed are negative as they relate to the chief complaint within the history of present illness.  Patient denies  fevers or chills.   Assessment & Plan: Visit Diagnoses:  1. Pain in left hand     Plan: Impression is fairly severe left long finger flexor tenosynovitis.  Her fingers no longer triggering because she can't really flex the PIP joint enough to walk the tendon sheath.  This is a progression since her last visit where every time she bend her finger the digit would lock.  I recommend surgical release of the A1 pulley in order to facilitate functional range of motion returned in that left long finger.  She'll consider her options and call when she wants to schedule.  Risks and benefits are discussed.  I'll see her back as needed.  I don't think ultrasound-guided injection would be helpful as her symptoms have been going on for a very long time and have increased significantly in severity over the past several months  Follow-Up Instructions: Return if symptoms worsen or fail to improve.   Orders:  No orders of the defined types were placed in this encounter.  No orders of the defined types were placed in this encounter.     Procedures: No procedures  performed   Clinical Data: No additional findings.  Objective: Vital Signs: There were no vitals taken for this visit.  Physical Exam:   Constitutional: Patient appears well-developed HEENT:  Head: Normocephalic Eyes:EOM are normal Neck: Normal range of motion Cardiovascular: Normal rate Pulmonary/chest: Effort normal Neurologic: Patient is alert Skin: Skin is warm Psychiatric: Patient has normal mood and affect    Ortho Exam: Orthopedic exam demonstrates normal gait and alignment left wrist demonstrates good wrist range of motion but tenderness over the A1 pulley of the left long finger.  She's lacking a little bit of extension of that finger at the MCP joint but cannot flex it down to the thenar eminence.  She comes about 3 or 4 inches away from doing that.  The index ring and small fingers all flex down to the palm.  No masses palpable in the palm region  Specialty Comments:  No specialty comments available.  Imaging: No results found.   PMFS History: Patient Active Problem List   Diagnosis Date Noted  . Encounter for Medicare annual wellness exam 03/08/2016  . Medication management 03/31/2015  . B12 deficiency 12/22/2014  . History of colon cancer, no staging 12/22/2014  . Noncompliance with medication regimen 08/24/2014  . Scintillating scotoma of both eyes 08/16/2013  . Hyperlipidemia   .  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 GERD (gastroesophageal reflux disease)   . IBS (irritable bowel syndrome)   . Vitamin D deficiency   . Anemia 08/08/2009  . Depression, major, in remission ([place]) 08/08/2009  . Essential hypertension 08/08/2009  . Asthmatic bronchitis without complication 38/25/0539   Past Medical History:  Diagnosis Date  . Anemia   . Anxiety   . Arrhythmia   . Cancer [facility]) 1995   Colon Cancer  . Depression   . Diabetes mellitus without complication ([place])   . GERD (gastroesophageal reflux disease)   . Hyperlipidemia   . Hypertension     . IBS (irritable bowel syndrome)   . Vitamin D deficiency     Family History  Problem Relation Age of Onset  . Heart disease Father        MI  . Stroke Mother   . Rheum arthritis Mother   . Heart attack Brother        half brother  . Heart attack Maternal Uncle        half maternal uncle  . Breast cancer Neg Hx     Past Surgical History:  Procedure Laterality Date  . ANTERIOR CRUCIATE LIGAMENT REPAIR Right 04-09-04   right knee  . BUNIONECTOMY Bilateral 02/1987  . BUNIONECTOMY Right 03-08-08   right foot  . CARPAL TUNNEL RELEASE Bilateral 09-30-01;10-21-01  . CATARACT EXTRACTION W/ INTRAOCULAR LENS  IMPLANT, BILATERAL Bilateral 12-07-09;02-15-10  . left foot  05-23-05  . left knee  07-26-02  . SHOULDER SURGERY Left    Social History   Occupational History  . 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 Smoking status: Former Smoker    Types: Cigarettes    Quit date: 09/23/1986  . Smokeless tobacco: Never Used  . Alcohol use No  . Drug use: No  . Sexual activity: Not on file

## 2017-07-29 ENCOUNTER — Telehealth (INDEPENDENT_AMBULATORY_CARE_PROVIDER_SITE_OTHER): Payer: Self-pay

## 2017-07-29 NOTE — Telephone Encounter (Signed)
Patient called wanting to schedule hand surgery.  Need sheet. [phone]

## 2017-07-30 NOTE — Telephone Encounter (Signed)
Blue sheet done thx

## 2017-07-30 NOTE — Telephone Encounter (Signed)
Can you please fill out blue sheet? Thanks.

## 2017-07-30 NOTE — Telephone Encounter (Signed)
I put the sheet on your desk.

## 2017-08-08 NOTE — Telephone Encounter (Signed)
Spoke with patient and scheduled surgery. °

## 2017-08-16 ENCOUNTER — Other Ambulatory Visit: Payer: Self-pay | Admitting: Internal Medicine

## 2017-08-16 ENCOUNTER — Other Ambulatory Visit: Payer: Self-pay | Admitting: Physician Assistant

## 2017-08-18 DIAGNOSIS — M65332 Trigger finger, left middle finger: Secondary | ICD-10-CM | POA: Diagnosis not present

## 2017-08-19 ENCOUNTER — Telehealth (INDEPENDENT_AMBULATORY_CARE_PROVIDER_SITE_OTHER): Payer: Self-pay | Admitting: Orthopedic Surgery

## 2017-08-19 NOTE — Telephone Encounter (Signed)
Will with [facility] called needing to confirm that  patient had surgery yesterday. The number to contact Will is [phone] EXT: [phone]   Please ask for Sarah Case manager

## 2017-08-19 NOTE — Telephone Encounter (Signed)
IC s/w patient. She wanted to make sure she could remove bandage.

## 2017-08-19 NOTE — Telephone Encounter (Signed)
Needs some help with post op instructions, she cant read them. Please call patient back - [phone].

## 2017-08-20 NOTE — Telephone Encounter (Signed)
IC LMVM that yes pt had surgery on 11/26

## 2017-08-25 ENCOUNTER — Ambulatory Visit (INDEPENDENT_AMBULATORY_CARE_PROVIDER_SITE_OTHER): Payer: Medicare Other | Admitting: Orthopedic Surgery

## 2017-08-25 ENCOUNTER — Encounter (INDEPENDENT_AMBULATORY_CARE_PROVIDER_SITE_OTHER): Payer: Self-pay | Admitting: Orthopedic Surgery

## 2017-08-25 DIAGNOSIS — M79642 Pain in left hand: Secondary | ICD-10-CM

## 2017-08-29 NOTE — Progress Notes (Signed)
   Post-Op Visit Note   Patient: Nancy Myers           Date of Birth: 18-Apr-1946           MRN: [id number] Visit Date: 08/25/2017 PCP: Unk Pinto, MD   Assessment & Plan:  Chief Complaint:  Chief Complaint  Patient presents with  . Left Hand - Routine Post Op   Visit Diagnoses:  1. Pain in left hand     Plan: Patient returns 1 week out left trigger finger release.  She has been doing well.  Sutures intact and removed today.  She is also having tenderness over the first dorsal compartment consistent with clear veins tenosynovitis.  Plan to come back in 2 weeks for injection in this area as a planned injection under ultrasound guidance.  Follow-Up Instructions: Return in about 2 weeks (around 09/08/2017).   Orders:  No orders of the defined types were placed in this encounter.  No orders of the defined types were placed in this encounter.   Imaging: No results found.  PMFS History: Patient Active Problem List   Diagnosis Date Noted  . Encounter for Medicare annual wellness exam 03/08/2016  . Medication management 03/31/2015  . B12 deficiency 12/22/2014  . History of colon cancer, no staging 12/22/2014  . Noncompliance with medication regimen 08/24/2014  . Scintillating scotoma of both eyes 08/16/2013  . Hyperlipidemia   . 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 GERD (gastroesophageal reflux disease)   . IBS (irritable bowel syndrome)   . Vitamin D deficiency   . Anemia 08/08/2009  . Depression, major, in remission ([place]) 08/08/2009  . Essential hypertension 08/08/2009  . Asthmatic bronchitis without complication 49/44/9675   Past Medical History:  Diagnosis Date  . Anemia   . Anxiety   . Arrhythmia   . Cancer [facility]) 1995   Colon Cancer  . Depression   . Diabetes mellitus without complication ([place])   . GERD (gastroesophageal reflux disease)   . Hyperlipidemia   . Hypertension   . IBS (irritable bowel syndrome)   . Vitamin D deficiency     Family History  Problem Relation Age of Onset  . Heart disease Father        MI  . Stroke Mother   . Rheum arthritis Mother   . Heart attack Brother        half brother  . Heart attack Maternal Uncle        half maternal uncle  . Breast cancer Neg Hx     Past Surgical History:  Procedure Laterality Date  . ANTERIOR CRUCIATE LIGAMENT REPAIR Right 04-09-04   right knee  . BUNIONECTOMY Bilateral 02/1987  . BUNIONECTOMY Right 03-08-08   right foot  . CARPAL TUNNEL RELEASE Bilateral 09-30-01;10-21-01  . CATARACT EXTRACTION W/ INTRAOCULAR LENS  IMPLANT, BILATERAL Bilateral 12-07-09;02-15-10  . left foot  05-23-05  . left knee  07-26-02  . SHOULDER SURGERY Left    Social History   Occupational History  . 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 Smoking status: Former Smoker    Types: Cigarettes    Last attempt to quit: 09/23/1986    Years since quitting: 30.9  . Smokeless tobacco: Never Used  Substance and Sexual Activity  . Alcohol use: No  . Drug use: No  . Sexual activity: Not on file

## 2017-09-08 ENCOUNTER — Ambulatory Visit (INDEPENDENT_AMBULATORY_CARE_PROVIDER_SITE_OTHER): Payer: Medicare Other | Admitting: Orthopedic Surgery

## 2017-09-08 ENCOUNTER — Encounter (INDEPENDENT_AMBULATORY_CARE_PROVIDER_SITE_OTHER): Payer: Self-pay | Admitting: Orthopedic Surgery

## 2017-09-08 DIAGNOSIS — M654 Radial styloid tenosynovitis [de Quervain]: Secondary | ICD-10-CM

## 2017-09-08 DIAGNOSIS — M25532 Pain in left wrist: Secondary | ICD-10-CM | POA: Diagnosis not present

## 2017-09-08 MED ORDER — METHYLPREDNISOLONE ACETATE 40 MG/ML IJ SUSP
13.3300 mg | INTRAMUSCULAR | Status: AC | PRN
  Administered 2017-09-08: 13.33 mg

## 2017-09-08 MED ORDER — LIDOCAINE HCL 1 % IJ SOLN
0.3000 mL | INTRAMUSCULAR | Status: AC | PRN
  Administered 2017-09-08: .3 mL

## 2017-09-08 NOTE — Progress Notes (Addendum)
Office Visit Note   Patient: Nancy Myers           Date of Birth: November 13, 1945           MRN: [id number] Visit Date: 09/08/2017 Requested by: Unk Pinto, MD [address] [place], [place] [address] PCP: Unk Pinto, MD  Subjective: Chief Complaint  Patient presents with  . Left Hand - Follow-up    HPI: Patient presents for follow-up of trigger finger release.  She has been doing well with that.  She also has on that left wrist pretty significant de Quervain's tenosynovitis.  She has been having pain with any type of motion with the arm.  Hurts her to use the wrist particularly with radial and ulnar deviation.              ROS: All systems reviewed are negative as they relate to the chief complaint within the history of present illness.  Patient denies  fevers or chills.   Assessment & Plan: Visit Diagnoses:  1. De Quervain's tenosynovitis, left     Plan: Impression is de Quervain's tenosynovitis left wrist with improving range of motion left long trigger finger release.  Plan is to continue working on range of motion of the hand and as far as the wrist goes we will try an ultrasound-guided injection into that first dorsal compartment today.  Risk and benefits are discussed including incomplete pain relief and de pigmentation.  I will see her back as needed.  Okay to return to work without restriction 09/29/2017  Follow-Up Instructions: Return if symptoms worsen or fail to improve.   Orders:  No orders of the defined types were placed in this encounter.  No orders of the defined types were placed in this encounter.     Procedures: Hand/UE Inj: L extensor compartment 1 for de Quervain's tenosynovitis on 09/08/2017 1:32 PM Indications: therapeutic Details: 25 G needle, ultrasound-guided radial approach Medications: 0.3 mL lidocaine 1 %; 13.33 mg methylPREDNISolone acetate 40 MG/ML Outcome: tolerated well, no immediate complications Procedure,  treatment alternatives, risks and benefits explained, specific risks discussed. Consent was given by the patient. Immediately prior to procedure a time out was called to verify the correct patient, procedure, equipment, support staff and site/side marked as required. Patient was prepped and draped in the usual sterile fashion.       Clinical Data: No additional findings.  Objective: Vital Signs: There were no vitals taken for this visit.  Physical Exam:   Constitutional: Patient appears well-developed HEENT:  Head: Normocephalic Eyes:EOM are normal Neck: Normal range of motion Cardiovascular: Normal rate Pulmonary/chest: Effort normal Neurologic: Patient is alert Skin: Skin is warm Psychiatric: Patient has normal mood and affect    Ortho Exam: Orthopedic exam demonstrates tenderness over the first dorsal compartment on the left-hand side with intact EPL FPL function.  Wrist range of motion otherwise normal.  Incision well-healed at the base of the third finger.  Patient is getting more grip strength back.  Specialty Comments:  No specialty comments available.  Imaging: No results found.   PMFS History: Patient Active Problem List   Diagnosis Date Noted  . Encounter for Medicare annual wellness exam 03/08/2016  . Medication management 03/31/2015  . B12 deficiency 12/22/2014  . History of colon cancer, no staging 12/22/2014  . Noncompliance with medication regimen 08/24/2014  . Scintillating scotoma of both eyes 08/16/2013  . Hyperlipidemia   . 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 GERD (gastroesophageal reflux disease)   .  IBS (irritable bowel syndrome)   . Vitamin D deficiency   . Anemia 08/08/2009  . Depression, major, in remission ([place]) 08/08/2009  . Essential hypertension 08/08/2009  . Asthmatic bronchitis without complication 54/98/2641   Past Medical History:  Diagnosis Date  . Anemia   . Anxiety   . Arrhythmia   . Cancer [facility]) 1995   Colon  Cancer  . Depression   . Diabetes mellitus without complication ([place])   . GERD (gastroesophageal reflux disease)   . Hyperlipidemia   . Hypertension   . IBS (irritable bowel syndrome)   . Vitamin D deficiency     Family History  Problem Relation Age of Onset  . Heart disease Father        MI  . Stroke Mother   . Rheum arthritis Mother   . Heart attack Brother        half brother  . Heart attack Maternal Uncle        half maternal uncle  . Breast cancer Neg Hx     Past Surgical History:  Procedure Laterality Date  . ANTERIOR CRUCIATE LIGAMENT REPAIR Right 04-09-04   right knee  . BUNIONECTOMY Bilateral 02/1987  . BUNIONECTOMY Right 03-08-08   right foot  . CARPAL TUNNEL RELEASE Bilateral 09-30-01;10-21-01  . CATARACT EXTRACTION W/ INTRAOCULAR LENS  IMPLANT, BILATERAL Bilateral 12-07-09;02-15-10  . left foot  05-23-05  . left knee  07-26-02  . SHOULDER SURGERY Left    Social History   Occupational History  . 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 Smoking status: Former Smoker    Types: Cigarettes    Last attempt to quit: 09/23/1986    Years since quitting: 30.9  . Smokeless tobacco: Never Used  Substance and Sexual Activity  . Alcohol use: No  . Drug use: No  . Sexual activity: Not on file

## 2017-09-24 ENCOUNTER — Ambulatory Visit: Payer: Self-pay | Admitting: Internal Medicine

## 2017-09-25 ENCOUNTER — Telehealth (INDEPENDENT_AMBULATORY_CARE_PROVIDER_SITE_OTHER): Payer: Self-pay | Admitting: Orthopedic Surgery

## 2017-09-25 NOTE — Telephone Encounter (Signed)
Patient called requesting a return to work note that is dated for Monday, January 7th.  She is wanting to come pick it up on Monday morning so that she can return to work that afternoon.  CB#[phone].  Thank you.

## 2017-09-25 NOTE — Telephone Encounter (Signed)
Note generated for patient. Will call tomorrow to let her know ready to pick up.

## 2017-09-26 NOTE — Telephone Encounter (Signed)
IC advised could pick up at front desk.  

## 2017-09-29 ENCOUNTER — Telehealth (INDEPENDENT_AMBULATORY_CARE_PROVIDER_SITE_OTHER): Payer: Self-pay | Admitting: Orthopedic Surgery

## 2017-09-29 NOTE — Telephone Encounter (Signed)
Records 07/24/2017-present faxed [facility], Ander Purpura claim # [id number]. Fax [phone]

## 2017-10-15 DIAGNOSIS — Z961 Presence of intraocular lens: Secondary | ICD-10-CM | POA: Diagnosis not present

## 2017-10-15 DIAGNOSIS — H524 Presbyopia: Secondary | ICD-10-CM | POA: Diagnosis not present

## 2017-10-15 DIAGNOSIS — H26492 Other secondary cataract, left eye: Secondary | ICD-10-CM | POA: Diagnosis not present

## 2017-10-15 LAB — HM DIABETES EYE EXAM

## 2017-10-16 ENCOUNTER — Encounter: Payer: Self-pay | Admitting: *Deleted

## 2017-10-20 NOTE — Progress Notes (Signed)
CPE AND FOLLOW UP  Assessment:    Essential hypertension - continue medications, DASH diet, exercise and monitor at home. Call if greater than 130/80.  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 -     Hemoglobin A1c - start to check sugars 2 x a week  Hyperlipidemia, unspecified hyperlipidemia type -continue medications, check lipids, decrease fatty foods, increase activity.  -     Lipid panel  Depression, major, in remission ([place]) Continue meds  Asthmatic bronchitis without complication, unspecified asthma severity, unspecified whether persistent Continue inhalers  Irritable bowel syndrome, unspecified type Increase fiber, diet discussed  Gastroesophageal reflux disease with esophagitis Continue PPI/H2 blocker, diet discussed  Vitamin D deficiency Continue supplement  Scintillating scotoma of both eyes Continue eye doc follow up  Noncompliance with medication regimen Emphasized compliance.   Medication management -     Magnesium  History of colon cancer, no staging UTD   B12 deficiency B12 injections  Iron deficiency anemia, unspecified iron deficiency anemia type Continue iron   Overweight BMI 28 - long discussion about weight loss, diet, and exercise -recommended diet heavy in fruits and veggies and low in animal meats, cheeses, and dairy products   Over 30 minutes of exam, counseling, chart review, and critical decision making was performed Future Appointments  Date Time Provider [place]  10/30/2018  9:30 AM Vicie Mutters, PA-C [id number] None     Subjective:   Nancy Myers is a 72 y.o. female  who presents for CPE and 3 month follow up on hypertension, diabetes, hyperlipidemia, vitamin D def.   Her blood pressure has been controlled at home, today their BP is BP: 134/90 She does workout. She denies chest pain, dizziness. She has chronic shortness of breath with exertion.   She follows with Dr. Wynonia Lawman for history of CP/SOB, on bASA, no CV complaints.    She has asthma and allergies, follows with Dr. Annamaria Boots, she is on albuterol and symbicort and last PFT was 09/2013.   She is on cholesterol medication,  lipitor 40, takes 3-4 x a week.. Her cholesterol is not at goal less than 70. The cholesterol last visit was:   Lab Results  Component Value Date   CHOL 146 06/20/2017   HDL 38 (L) 06/20/2017   LDLCALC 149 (H) 03/10/2017   TRIG 124 06/20/2017   CHOLHDL 3.8 06/20/2017   She has been working on diet and exercise for Diabetes with diabetic polyneuropathy, does not check your insurance, she is on bASA, she is on ACE/ARB, and denies polydipsia, polyuria and visual disturbances. Last A1C was:  Lab Results  Component Value Date   HGBA1C 7.1 (H) 06/20/2017   Lab Results  Component Value Date   GFRAA 89 06/20/2017   Patient is on Vitamin D supplement. Lab Results  Component Value Date   VD25OH 24 (L) 03/10/2017   She is on prozac for depression/anxiety.   She has b12 def and is on b12 shots.   Lab Results  Component Value Date   [id number] 09/21/2014    BMI is Body mass index is 28.24 kg/m., she is working on diet and exercise. Wt Readings from Last  3 Encounters:  10/23/17 144 lb 9.6 oz (65.6 kg)  06/20/17 145 lb 3.2 oz (65.9 kg)  04/09/17 144 lb 3.2 oz (65.4 kg)     Medication Review Current Outpatient Medications on File Prior to Visit  Medication Sig Dispense Refill  . acyclovir (ZOVIRAX) 400 MG tablet Take 1 pill two times daily as needed 60 tablet 0  . albuterol (PROAIR HFA) 108 (90 Base) MCG/ACT inhaler Inhale two puffs every four to six hours as needed  for cough or wheeze.    Marland Kitchen amLODipine (NORVASC) 5 MG tablet TAKE 1 TABLET DAILY 90 tablet 1  . aspirin 81 MG chewable tablet Chew 162 mg by mouth daily.     Marland Kitchen atorvastatin (LIPITOR) 40 MG tablet Take 1 tablet (40 mg total) by mouth daily. 90 tablet 1  . Blood Glucose Monitoring Suppl (FREESTYLE FREEDOM LITE) W/DEVICE KIT Check Blood Sugar One Time Daily 1 each PRN  . budesonide (RHINOCORT ALLERGY) 32 MCG/ACT nasal spray Place 1 spray into both nostrils daily as needed for rhinitis.    . budesonide-formoterol (SYMBICORT) 160-4.5 MCG/ACT inhaler Inhale two puffs twice a day to prevent cough or wheeze.  Rinse, gargle, and spit after use. 3 Inhaler 3  . carisoprodol (SOMA) 250 MG tablet Take 1 tablet (250 mg total) by mouth every 6 (six) hours as needed. for pain 180 tablet 3  . Cholecalciferol (VITAMIN D PO) Take 5,000 Units by mouth.    . cyanocobalamin (,VITAMIN B-12,) 1000 MCG/ML injection INJECT 1 ML INTO THE MUSCLE EVERY 30 DAYS 10 mL 0  . FLUoxetine (PROZAC) 40 MG capsule Take 1 capsule (40 mg total) by mouth daily. 30 capsule 3  . fluticasone-salmeterol (ADVAIR HFA) 115-21 MCG/ACT inhaler Inhale 2 puffs into the lungs 2 (two) times daily. 1 Inhaler 5  . hydrochlorothiazide (HYDRODIURIL) 25 MG tablet TAKE 1 TABLET DAILY 90 tablet 1  . ipratropium (ATROVENT) 0.02 % nebulizer solution Take 0.5 mg by nebulization every 4 (four) hours as needed for wheezing or shortness of breath.    . Lancets (FREESTYLE) lancets Test Blood Sugar One Time Daily 100 each PRN  . Magnesium 500 MG TABS Take by mouth.    . metFORMIN (GLUCOPHAGE) 500 MG tablet Take 2 tablets (1,000 mg total) by mouth daily with breakfast. 180 tablet 1  . metoprolol succinate (TOPROL-XL) 100 MG 24 hr tablet Take 1 tablet (100 mg total) by mouth daily. 90 tablet 1  . mometasone (ELOCON) 0.1 % ointment Apply to affected skin once daily as directed. 45 g 3  . omeprazole (PRILOSEC) 40 MG capsule Take 1 capsule (40 mg total) by mouth every  morning. (Patient taking differently: Take 20 mg by mouth every morning. ) 90 capsule 3  . ranitidine (ZANTAC) 300 MG capsule Take 1 capsule (300 mg total) by mouth every evening. (Patient taking differently: Take 150 mg by mouth every evening. ) 90 capsule 3  . vitamin C (ASCORBIC ACID) 500 MG tablet Take 500 mg by mouth daily.     No current facility-administered medications on file prior to visit.     Current Problems (verified) Patient Active Problem List   Diagnosis Date Noted  . Encounter for Medicare annual wellness exam 03/08/2016  . Medication management 03/31/2015  . B12 deficiency 12/22/2014  . History of colon cancer, no staging 12/22/2014  . Noncompliance with medication regimen 08/24/2014  . Scintillating scotoma of both eyes 08/16/2013  . Hyperlipidemia   . 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 GERD (  gastroesophageal reflux disease)   . IBS (irritable bowel syndrome)   . Vitamin D deficiency   . Anemia 08/08/2009  . Depression, major, in remission ([place]) 08/08/2009  . Essential hypertension 08/08/2009  . Asthmatic bronchitis without complication 34/91/7915   Screening Tests Immunization History  Administered Date(s) Administered  . Influenza Split 06/23/2013  . Influenza, High Dose Seasonal PF 08/01/2014, 07/19/2015, 06/10/2016, 06/20/2017  . Pneumococcal Conjugate-13 09/21/2014  . Pneumococcal-Unspecified 09/03/2012  . Td 10/03/2010   Preventative care: Tetanus: 2012  Pneumovax:2013  Prevnar 13: 2015 Flu vaccine: 2018 Zostavax: declines due to cost  Pap: 09/2015 MGM: 01/2017 DEXA: declines Colonoscopy: 03/2017 Dr. Oletta Lamas EGD: 03/2017 PFTs 09/2013 CT pelvis 01/2012 CXR 07/2013 Stress test 2014 Echo 08/2013  Names of Other Physician/Practitioners you currently use: 1. [facility]- here for primary care 2. Dr. Claudean Kinds, DIABETIC eye doctor, last visit  10/15/2017 3. Dr. Currie Paris, dentist, last visit  2016 Patient Care Team: Unk Pinto, MD as PCP - General (Internal Medicine) Laurence Spates, MD as Consulting Physician (Gastroenterology) Debara Pickett Nadean Corwin, MD as Consulting Physician (Cardiology) Deneise Lever, MD as Consulting Physician (Pulmonary Disease) Dr. Marlou Sa, ortho for left shoulder  Allergies Allergies  Allergen Reactions  . Codeine Itching    SURGICAL HISTORY She  has a past surgical history that includes Bunionectomy (Bilateral, 02/1987); Carpal tunnel release (Bilateral, 09-30-01;10-21-01); Anterior cruciate ligament repair (Right, 04-09-04); left knee (07-26-02); left foot (05-23-05); Bunionectomy (Right, 03-08-08); Cataract extraction w/ intraocular lens  implant, bilateral (Bilateral, 12-07-09;02-15-10); and Shoulder surgery (Left).   FAMILY HISTORY Her family history includes Heart attack in her brother and maternal uncle; Heart disease in her father; Rheum arthritis in her mother; Stroke in her mother.   SOCIAL HISTORY She  reports that she quit smoking about 31 years ago. Her smoking use included cigarettes. she has never used smokeless tobacco. She reports that she does not drink alcohol or use drugs.   Objective:   Blood pressure 134/90, pulse 67, temperature 97.6 F (36.4 C), resp. rate 16, height 5' (1.524 m), weight 144 lb 9.6 oz (65.6 kg), SpO2 97 %. Body mass index is 28.24 kg/m.  General appearance: alert, no distress, WD/WN,  female HEENT: normocephalic, sclerae anicteric, TMs pearly, nares patent, no discharge or erythema, pharynx normal Oral cavity: MMM, no lesions Neck: supple, no lymphadenopathy, no thyromegaly, no masses Heart: RRR, normal S1, S2, no murmurs Lungs: CTA bilaterally, no wheezes, rhonchi, or rales Abdomen: +bs, soft, non tender, non distended, no masses, no hepatomegaly, no splenomegaly Musculoskeletal: nontender, no swelling, no obvious deformity Extremities: no edema, no cyanosis, no clubbing Pulses: 2+ symmetric, upper and lower  extremities, normal cap refill Neurological: alert, oriented x 3, CN2-12 intact, strength normal upper extremities and lower extremities, sensation decreased bilateral feet, DTRs 2+ throughout, no cerebellar signs, gait normal Psychiatric: normal affect, behavior normal, pleasant    EKG WNL, no ST changes  Vicie Mutters, PA-C   10/23/2017

## 2017-10-23 ENCOUNTER — Ambulatory Visit (INDEPENDENT_AMBULATORY_CARE_PROVIDER_SITE_OTHER): Payer: Medicare Other | Admitting: Physician Assistant

## 2017-10-23 ENCOUNTER — Encounter: Payer: Self-pay | Admitting: Physician Assistant

## 2017-10-23 VITALS — BP 134/90 | HR 67 | Temp 97.6°F | Resp 16 | Ht 60.0 in | Wt 144.6 lb

## 2017-10-23 DIAGNOSIS — K2100 Gastro-esophageal reflux disease with esophagitis, without bleeding: Secondary | ICD-10-CM

## 2017-10-23 DIAGNOSIS — I10 Essential (primary) hypertension: Secondary | ICD-10-CM

## 2017-10-23 DIAGNOSIS — Z85038 Personal history of other malignant neoplasm of large intestine: Secondary | ICD-10-CM

## 2017-10-23 DIAGNOSIS — Z136 Encounter for screening for cardiovascular disorders: Secondary | ICD-10-CM

## 2017-10-23 DIAGNOSIS — F325 Major depressive disorder, single episode, in full remission: Secondary | ICD-10-CM

## 2017-10-23 DIAGNOSIS — K589 Irritable bowel syndrome without diarrhea: Secondary | ICD-10-CM | POA: Diagnosis not present

## 2017-10-23 DIAGNOSIS — Z6828 Body mass index (BMI) 28.0-28.9, adult: Secondary | ICD-10-CM

## 2017-10-23 DIAGNOSIS — E1149 Type 2 diabetes mellitus with other diabetic neurological complication: Secondary | ICD-10-CM

## 2017-10-23 DIAGNOSIS — D509 Iron deficiency anemia, unspecified: Secondary | ICD-10-CM | POA: Diagnosis not present

## 2017-10-23 DIAGNOSIS — J45909 Unspecified asthma, uncomplicated: Secondary | ICD-10-CM | POA: Diagnosis not present

## 2017-10-23 DIAGNOSIS — Z91148 Patient's other noncompliance with medication regimen for other reason: Secondary | ICD-10-CM

## 2017-10-23 DIAGNOSIS — E538 Deficiency of other specified B group vitamins: Secondary | ICD-10-CM | POA: Diagnosis not present

## 2017-10-23 DIAGNOSIS — E785 Hyperlipidemia, unspecified: Secondary | ICD-10-CM | POA: Diagnosis not present

## 2017-10-23 DIAGNOSIS — Z9114 Patient's other noncompliance with medication regimen: Secondary | ICD-10-CM

## 2017-10-23 DIAGNOSIS — Z79899 Other long term (current) drug therapy: Secondary | ICD-10-CM

## 2017-10-23 DIAGNOSIS — E559 Vitamin D deficiency, unspecified: Secondary | ICD-10-CM

## 2017-10-23 NOTE — Patient Instructions (Addendum)
Stop the cozaar/losartan  Call the pharmacy to see if your Valsartan is safe to take or not If it is get back on it for kidney protection from you diabetes If it is NOT safe to take/recalled, contact the office and we can send in a different medication  Take prilosec 40mg  and then go to the 20 mg to see if this helps with cough  You can take tylenol (500mg ) or tylenol arthritis (650mg ) with the meloxicam/antiinflammatories. The max you can take of tylenol a day is 3000mg  daily, this is a max of 6 pills a day of the regular tyelnol (500mg ) or a max of 4 a day of the tylenol arthritis (650mg ) as long as no other medications you are taking contain tylenol.   CAN REFER YOU TO PHYSICAL THERAPY   Go to the ER if you have any new weakness in your arms, trouble with your grip, worse headache ever, fever, chills. or have worsening pain.   If you are not better in 1-3 month we will refer you to ortho   Cervical Sprain A cervical sprain is a stretch or tear in one or more of the tough, cord-like tissues that connect bones (ligaments) in the neck. Cervical sprains can range from mild to severe. Severe cervical sprains can cause the spinal bones (vertebrae) in the neck to be unstable. This can lead to spinal cord damage and can result in serious nervous system problems. The amount of time that it takes for a cervical sprain to get better depends on the cause and extent of the injury. Most cervical sprains heal in 4-6 weeks. What are the causes? Cervical sprains may be caused by an injury (trauma), such as from a motor vehicle accident, a fall, or sudden forward and backward whipping movement of the head and neck (whiplash injury). Mild cervical sprains may be caused by wear and tear over time, such as from poor posture, sitting in a chair that does not provide support, or looking up or down for long periods of time. What increases the risk? The following factors may make you more likely to develop this  condition:  Participating in activities that have a high risk of trauma to the neck. These include contact sports, auto racing, gymnastics, and diving.  Taking risks when driving or riding in a motor vehicle, such as speeding.  Having osteoarthritis of the spine.  Having poor strength and flexibility of the neck.  A previous neck injury.  Having poor posture.  Spending a lot of time in certain positions that put stress on the neck, such as sitting at a computer for long periods of time.  What are the signs or symptoms? Symptoms of this condition include:  Pain, soreness, stiffness, tenderness, swelling, or a burning sensation in the front, back, or sides of the neck.  Sudden tightening of neck muscles that you cannot control (muscle spasms).  Pain in the shoulders or upper back.  Limited ability to move the neck.  Headache.  Dizziness.  Nausea.  Vomiting.  Weakness, numbness, or tingling in a hand or an arm.  Symptoms may develop right away after injury, or they may develop over a few days. In some cases, symptoms may go away with treatment and return (recur) over time. How is this diagnosed? This condition may be diagnosed based on:  Your medical history.  Your symptoms.  Any recent injuries or known neck problems that you have, such as arthritis in the neck.  A physical exam.  Imaging tests, such as: ? X-rays. ? MRI. ? CT scan.  How is this treated? This condition is treated by resting and icing the injured area and doing physical therapy exercises. Depending on the severity of your condition, treatment may also include:  Keeping your neck in place (immobilized) for periods of time. This may be done using: ? A cervical collar. This supports your chin and the back of your head. ? A cervical traction device. This is a sling that holds up your head. This removes weight and pressure from your neck, and it may help to relieve pain.  Medicines that help to  relieve pain and inflammation.  Medicines that help to relax your muscles (muscle relaxants).  Surgery. This is rare.  Follow these instructions at home: If you have a cervical collar:  Wear it as told by your health care provider. Do not remove the collar unless instructed by your health care provider.  Ask your health care provider before you make any adjustments to your collar.  If you have long hair, keep it outside of the collar.  Ask your health care provider if you can remove the collar for cleaning and bathing. If you are allowed to remove the collar for cleaning or bathing: ? Follow instructions from your health care provider about how to remove the collar safely. ? Clean the collar by wiping it with mild soap and water and drying it completely. ? If your collar has removable pads, remove them every 1-2 days and wash them by hand with soap and water. Let them air-dry completely before you put them back in the collar. ? Check your skin under the collar for irritation or sores. If you see any, tell your health care provider. Managing pain, stiffness, and swelling  If directed, use a cervical traction device as told by your health care provider.  If directed, apply heat to the affected area before you do your physical therapy or as often as told by your health care provider. Use the heat source that your health care provider recommends, such as a moist heat pack or a heating pad. ? Place a towel between your skin and the heat source. ? Leave the heat on for 20-30 minutes. ? Remove the heat if your skin turns bright red. This is especially important if you are unable to feel pain, heat, or cold. You may have a greater risk of getting burned.  If directed, put ice on the affected area: ? Put ice in a plastic bag. ? Place a towel between your skin and the bag. ? Leave the ice on for 20 minutes, 2-3 times a day. Activity  Do not drive while wearing a cervical collar. If you do  not have a cervical collar, ask your health care provider if it is safe to drive while your neck heals.  Do not drive or use heavy machinery while taking prescription pain medicine or muscle relaxants, unless your health care provider approves.  Do not lift anything that is heavier than 10 lb (4.5 kg) until your health care provider tells you that it is safe.  Rest as directed by your health care provider. Avoid positions and activities that make your symptoms worse. Ask your health care provider what activities are safe for you.  If physical therapy was prescribed, do exercises as told by your health care provider or physical therapist. General instructions  Take over-the-counter and prescription medicines only as told by your health care provider.  Do not  use any products that contain nicotine or tobacco, such as cigarettes and e-cigarettes. These can delay healing. If you need help quitting, ask your health care provider.  Keep all follow-up visits as told by your health care provider or physical therapist. This is important. How is this prevented? To prevent a cervical sprain from happening again:  Use and maintain good posture. Make any needed adjustments to your workstation to help you use good posture.  Exercise regularly as directed by your health care provider or physical therapist.  Avoid risky activities that may cause a cervical sprain.  Contact a health care provider if:  You have symptoms that get worse or do not get better after 2 weeks of treatment.  You have pain that gets worse or does not get better with medicine.  You develop new, unexplained symptoms.  You have sores or irritated skin on your neck from wearing your cervical collar. Get help right away if:  You have severe pain.  You develop numbness, tingling, or weakness in any part of your body.  You cannot move a part of your body (you have paralysis).  You have neck pain along with: ? Severe  dizziness. ? Headache. Summary  A cervical sprain is a stretch or tear in one or more of the tough, cord-like tissues that connect bones (ligaments) in the neck.  Cervical sprains may be caused by an injury (trauma), such as from a motor vehicle accident, a fall, or sudden forward and backward whipping movement of the head and neck (whiplash injury).  Symptoms may develop right away after injury, or they may develop over a few days.  This condition is treated by resting and icing the injured area and doing physical therapy exercises. This information is not intended to replace advice given to you by your health care provider. Make sure you discuss any questions you have with your health care provider. Document Released: 07/07/2007 Document Revised: 05/08/2016 Document Reviewed: 05/08/2016 Elsevier Interactive Patient Education  2017 [employer]   Cervical Strain and Sprain Rehab Ask your health care provider which exercises are safe for you. Do exercises exactly as told by your health care provider and adjust them as directed. It is normal to feel mild stretching, pulling, tightness, or discomfort as you do these exercises, but you should stop right away if you feel sudden pain or your pain gets worse.Do not begin these exercises until told by your health care provider. Stretching and range of motion exercises These exercises warm up your muscles and joints and improve the movement and flexibility of your neck. These exercises also help to relieve pain, numbness, and tingling. Exercise A: Cervical side bend  1. Using good posture, sit on a stable chair or stand up. 2. Without moving your shoulders, slowly tilt your left / right ear to your shoulder until you feel a stretch in your neck muscles. You should be looking straight ahead. 3. Hold for __________ seconds. 4. Repeat with the other side of your neck. Repeat __________ times. Complete this exercise __________ times a day. Exercise  B: Cervical rotation  1. Using good posture, sit on a stable chair or stand up. 2. Slowly turn your head to the side as if you are looking over your left / right shoulder. ? Keep your eyes level with the ground. ? Stop when you feel a stretch along the side and the back of your neck. 3. Hold for __________ seconds. 4. Repeat this by turning to your other side.  Repeat __________ times. Complete this exercise __________ times a day. Exercise C: Thoracic extension and pectoral stretch 1. Roll a towel or a small blanket so it is about 4 inches (10 cm) in diameter. 2. Lie down on your back on a firm surface. 3. Put the towel lengthwise, under your spine in the middle of your back. It should not be not under your shoulder blades. The towel should line up with your spine from your middle back to your lower back. 4. Put your hands behind your head and let your elbows fall out to your sides. 5. Hold for __________ seconds. Repeat __________ times. Complete this exercise __________ times a day. Strengthening exercises These exercises build strength and endurance in your neck. Endurance is the ability to use your muscles for a long time, even after your muscles get tired. Exercise D: Upper cervical flexion, isometric 1. Lie on your back with a thin pillow behind your head and a small rolled-up towel under your neck. 2. Gently tuck your chin toward your chest and nod your head down to look toward your feet. Do not lift your head off the pillow. 3. Hold for __________ seconds. 4. Release the tension slowly. Relax your neck muscles completely before you repeat this exercise. Repeat __________ times. Complete this exercise __________ times a day. Exercise E: Cervical extension, isometric  1. Stand about 6 inches (15 cm) away from a wall, with your back facing the wall. 2. Place a soft object, about 6-8 inches (15-20 cm) in diameter, between the back of your head and the wall. A soft object could be a  small pillow, a ball, or a folded towel. 3. Gently tilt your head back and press into the soft object. Keep your jaw and forehead relaxed. 4. Hold for __________ seconds. 5. Release the tension slowly. Relax your neck muscles completely before you repeat this exercise. Repeat __________ times. Complete this exercise __________ times a day. Posture and body mechanics  Body mechanics refers to the movements and positions of your body while you do your daily activities. Posture is part of body mechanics. Good posture and healthy body mechanics can help to relieve stress in your body's tissues and joints. Good posture means that your spine is in its natural S-curve position (your spine is neutral), your shoulders are pulled back slightly, and your head is not tipped forward. The following are general guidelines for applying improved posture and body mechanics to your everyday activities. Standing  When standing, keep your spine neutral and keep your feet about hip-width apart. Keep a slight bend in your knees. Your ears, shoulders, and hips should line up.  When you do a task in which you stand in one place for a long time, place one foot up on a stable object that is 2-4 inches (5-10 cm) high, such as a footstool. This helps keep your spine neutral. Sitting   When sitting, keep your spine neutral and your keep feet flat on the floor. Use a footrest, if necessary, and keep your thighs parallel to the floor. Avoid rounding your shoulders, and avoid tilting your head forward.  When working at a desk or a computer, keep your desk at a height where your hands are slightly lower than your elbows. Slide your chair under your desk so you are close enough to maintain good posture.  When working at a computer, place your monitor at a height where you are looking straight ahead and you do not have to tilt your  head forward or downward to look at the screen. Resting When lying down and resting, avoid  positions that are most painful for you. Try to support your neck in a neutral position. You can use a contour pillow or a small rolled-up towel. Your pillow should support your neck but not push on it. This information is not intended to replace advice given to you by your health care provider. Make sure you discuss any questions you have with your health care provider. Document Released: 09/09/2005 Document Revised: 05/16/2016 Document Reviewed: 08/16/2015 Elsevier Interactive Patient Education  2018 [employer].  Intermittent fasting is more about strategy than starvation. It's meant to reset your body in different ways, hopefully with fitness and nutrition changes as a result.  Like any big switchover, though, results may vary when it comes down to the individual level. What works for your friends may not work for you, or vice versa. That's why it's helpful to play around with variations on intermittent fasting and healthy habits and find what works best for you.  WHAT IS INTERMITTENT FASTING AND WHY DO IT?  Intermittent fasting doesn't involve specific foods, but rather, a strict schedule regarding when you eat. Also called "time-restricted eating," the tactic has been praised for its contribution to weight loss, improved body composition, and decreased cravings. Preliminary research also suggests it may be beneficial for glucose tolerance, hormone regulation, better muscle mass and lower body fat.  Part of its appeal is the simplicity of the effort. Unlike some other trends, there's no calculations to intermittent fasting.  You simply eat within a certain block of time, usually a window of 8-10 hours. In the other big block of time - about 14-16 hours, including when you're asleep - you don't eat anything, not even snacks. You can drink water, coffee, tea or any other beverage that doesn't have calories.  For example, if you like having a late dinner, you might skip breakfast and have your  first meal at noon and your last meal of the day at 8 p.m., and then not eat until noon again the next day.  IDEAS FOR GETTING STARTED  If you're new to the strategy, it may be helpful to eat within the typical circadian rhythm and keep eating within daylight hours. This can be especially beneficial if you're looking at intermittent fasting for weight-loss goals.  So first try only eating between 12pm to 8pm.  Outside of this time you may have water, black coffee, and hot tea. You may not eat it drink anything that has carbs, sugars, OR artificial sugars like diet soda.   Like any major eating and fitness shift, it can take time to find the perfect fit, so don't be afraid to experiment with different options - including ditching intermittent fasting altogether if it's simply not for you. But if it is, you may be surprised by some of the benefits that come along with the strategy.  Veggies are great because you can eat a ton! They are low in calories, great to fill you up, and have a ton of vitamins, minerals, and protein.

## 2017-10-24 LAB — URINALYSIS, ROUTINE W REFLEX MICROSCOPIC
BACTERIA UA: NONE SEEN /HPF
Bilirubin Urine: NEGATIVE
Glucose, UA: NEGATIVE
HGB URINE DIPSTICK: NEGATIVE
Hyaline Cast: NONE SEEN /LPF
Ketones, ur: NEGATIVE
Nitrite: NEGATIVE
PH: 7 (ref 5.0–8.0)
PROTEIN: NEGATIVE
RBC / HPF: NONE SEEN /HPF (ref 0–2)
Specific Gravity, Urine: 1.015 (ref 1.001–1.03)
WBC UA: NONE SEEN /HPF (ref 0–5)

## 2017-10-24 LAB — LIPID PANEL
CHOL/HDL RATIO: 4.3 (calc) (ref <5.0)
CHOLESTEROL: 189 mg/dL (ref <200)
HDL: 44 mg/dL — AB (ref >50)
LDL CHOLESTEROL (CALC): 114 mg/dL — AB
Non-HDL Cholesterol (Calc): 145 mg/dL (calc) — ABNORMAL HIGH (ref <130)
TRIGLYCERIDES: 184 mg/dL — AB (ref <150)

## 2017-10-24 LAB — CBC WITH DIFFERENTIAL/PLATELET
BASOS PCT: 0.9 %
Basophils Absolute: 42 cells/uL (ref 0–200)
Eosinophils Absolute: 150 cells/uL (ref 15–500)
Eosinophils Relative: 3.2 %
HCT: 36.3 % (ref 35.0–45.0)
HEMOGLOBIN: 12 g/dL (ref 11.7–15.5)
Lymphs Abs: 1993 cells/uL (ref 850–3900)
MCH: 26.8 pg — ABNORMAL LOW (ref 27.0–33.0)
MCHC: 33.1 g/dL (ref 32.0–36.0)
MCV: 81.2 fL (ref 80.0–100.0)
MONOS PCT: 10.4 %
MPV: 10.5 fL (ref 7.5–12.5)
NEUTROS ABS: 2026 {cells}/uL (ref 1500–7800)
Neutrophils Relative %: 43.1 %
PLATELETS: 295 10*3/uL (ref 140–400)
RBC: 4.47 10*6/uL (ref 3.80–5.10)
RDW: 13.3 % (ref 11.0–15.0)
Total Lymphocyte: 42.4 %
WBC: 4.7 10*3/uL (ref 3.8–10.8)
WBCMIX: 489 {cells}/uL (ref 200–950)

## 2017-10-24 LAB — HEPATIC FUNCTION PANEL
AG RATIO: 1.3 (calc) (ref 1.0–2.5)
ALBUMIN MSPROF: 3.8 g/dL (ref 3.6–5.1)
ALT: 11 U/L (ref 6–29)
AST: 16 U/L (ref 10–35)
Alkaline phosphatase (APISO): 109 U/L (ref 33–130)
Bilirubin, Direct: 0.1 mg/dL (ref 0.0–0.2)
GLOBULIN: 3 g/dL (ref 1.9–3.7)
Indirect Bilirubin: 0.4 mg/dL (calc) (ref 0.2–1.2)
Total Bilirubin: 0.5 mg/dL (ref 0.2–1.2)
Total Protein: 6.8 g/dL (ref 6.1–8.1)

## 2017-10-24 LAB — BASIC METABOLIC PANEL WITH GFR
BUN: 9 mg/dL (ref 7–25)
CALCIUM: 9.2 mg/dL (ref 8.6–10.4)
CHLORIDE: 99 mmol/L (ref 98–110)
CO2: 30 mmol/L (ref 20–32)
CREATININE: 0.64 mg/dL (ref 0.60–0.93)
GFR, Est African American: 104 mL/min/{1.73_m2} (ref >=60)
GFR, Est Non African American: 90 mL/min/{1.73_m2} (ref >=60)
GLUCOSE: 124 mg/dL — AB (ref 65–99)
Potassium: 4.1 mmol/L (ref 3.5–5.3)
Sodium: 138 mmol/L (ref 135–146)

## 2017-10-24 LAB — MICROALBUMIN / CREATININE URINE RATIO
Creatinine, Urine: 101 mg/dL (ref 20–275)
MICROALB UR: 1.1 mg/dL
Microalb Creat Ratio: 11 mcg/mg creat (ref <30)

## 2017-10-24 LAB — MAGNESIUM: Magnesium: 2 mg/dL (ref 1.5–2.5)

## 2017-10-24 LAB — HEMOGLOBIN A1C
EAG (MMOL/L): 8.9 (calc)
HEMOGLOBIN A1C: 7.2 %{Hb} — AB (ref <5.7)
MEAN PLASMA GLUCOSE: 160 (calc)

## 2017-10-24 LAB — VITAMIN B12: Vitamin B-12: 629 pg/mL (ref 200–1100)

## 2017-10-24 LAB — IRON, TOTAL/TOTAL IRON BINDING CAP
%SAT: 23 % (ref 11–50)
IRON: 73 ug/dL (ref 45–160)
TIBC: 318 ug/dL (ref 250–450)

## 2017-10-24 LAB — TSH: TSH: 1.53 mIU/L (ref 0.40–4.50)

## 2017-11-24 DIAGNOSIS — J4540 Moderate persistent asthma, uncomplicated: Secondary | ICD-10-CM | POA: Diagnosis not present

## 2017-11-24 DIAGNOSIS — I10 Essential (primary) hypertension: Secondary | ICD-10-CM | POA: Diagnosis not present

## 2017-11-24 DIAGNOSIS — E559 Vitamin D deficiency, unspecified: Secondary | ICD-10-CM | POA: Diagnosis not present

## 2017-11-24 DIAGNOSIS — E785 Hyperlipidemia, unspecified: Secondary | ICD-10-CM | POA: Diagnosis not present

## 2017-11-24 DIAGNOSIS — K219 Gastro-esophageal reflux disease without esophagitis: Secondary | ICD-10-CM | POA: Diagnosis not present

## 2017-11-24 DIAGNOSIS — Z7984 Long term (current) use of oral hypoglycemic drugs: Secondary | ICD-10-CM | POA: Diagnosis not present

## 2017-11-24 DIAGNOSIS — F324 Major depressive disorder, single episode, in partial remission: Secondary | ICD-10-CM | POA: Diagnosis not present

## 2017-11-24 DIAGNOSIS — E1122 Type 2 diabetes mellitus with diabetic chronic kidney disease: Secondary | ICD-10-CM | POA: Diagnosis not present

## 2017-11-24 DIAGNOSIS — Z9114 Patient's other noncompliance with medication regimen: Secondary | ICD-10-CM | POA: Diagnosis not present

## 2017-12-02 ENCOUNTER — Other Ambulatory Visit: Payer: Self-pay

## 2017-12-02 DIAGNOSIS — F329 Major depressive disorder, single episode, unspecified: Secondary | ICD-10-CM

## 2017-12-02 DIAGNOSIS — F32A Depression, unspecified: Secondary | ICD-10-CM

## 2017-12-03 ENCOUNTER — Other Ambulatory Visit: Payer: Self-pay | Admitting: Physician Assistant

## 2017-12-03 MED ORDER — VALSARTAN 320 MG PO TABS
ORAL_TABLET | ORAL | 0 refills | Status: AC
Start: 2017-12-03 — End: ?

## 2017-12-03 MED ORDER — CARISOPRODOL 250 MG PO TABS: 250.0000 mg | ORAL_TABLET | Freq: Four times a day (QID) | ORAL | 0 refills | Status: AC | PRN

## 2017-12-09 ENCOUNTER — Other Ambulatory Visit: Payer: Self-pay

## 2017-12-09 DIAGNOSIS — F32A Depression, unspecified: Secondary | ICD-10-CM

## 2017-12-09 DIAGNOSIS — F329 Major depressive disorder, single episode, unspecified: Secondary | ICD-10-CM

## 2017-12-09 MED ORDER — FLUOXETINE HCL 40 MG PO CAPS: 40.0000 mg | ORAL_CAPSULE | Freq: Every day | ORAL | 0 refills | Status: DC

## 2018-01-28 ENCOUNTER — Ambulatory Visit: Payer: Self-pay | Admitting: Internal Medicine

## 2018-02-05 ENCOUNTER — Other Ambulatory Visit: Payer: Self-pay | Admitting: Family Medicine

## 2018-02-05 ENCOUNTER — Other Ambulatory Visit: Payer: Self-pay | Admitting: Internal Medicine

## 2018-02-05 DIAGNOSIS — Z1231 Encounter for screening mammogram for malignant neoplasm of breast: Secondary | ICD-10-CM

## 2018-02-27 ENCOUNTER — Ambulatory Visit
Admission: RE | Admit: 2018-02-27 | Discharge: 2018-02-27 | Disposition: A | Discharge status: Home / Self Care | Payer: Medicare Other | Source: Ambulatory Visit | Attending: Family Medicine | Admitting: Family Medicine

## 2018-02-27 DIAGNOSIS — Z1231 Encounter for screening mammogram for malignant neoplasm of breast: Secondary | ICD-10-CM | POA: Diagnosis not present

## 2018-03-02 DIAGNOSIS — T180XXA Foreign body in mouth, initial encounter: Secondary | ICD-10-CM | POA: Diagnosis not present

## 2018-04-07 ENCOUNTER — Ambulatory Visit
Admission: RE | Admit: 2018-04-07 | Discharge: 2018-04-07 | Disposition: A | Discharge status: Home / Self Care | Payer: Medicare Other | Source: Ambulatory Visit | Attending: Family Medicine | Admitting: Family Medicine

## 2018-04-07 ENCOUNTER — Other Ambulatory Visit: Payer: Self-pay | Admitting: Family Medicine

## 2018-04-07 DIAGNOSIS — M542 Cervicalgia: Secondary | ICD-10-CM

## 2018-04-07 DIAGNOSIS — G8929 Other chronic pain: Secondary | ICD-10-CM | POA: Diagnosis not present

## 2018-04-07 DIAGNOSIS — E785 Hyperlipidemia, unspecified: Secondary | ICD-10-CM | POA: Diagnosis not present

## 2018-04-07 DIAGNOSIS — I10 Essential (primary) hypertension: Secondary | ICD-10-CM | POA: Diagnosis not present

## 2018-04-07 DIAGNOSIS — J4540 Moderate persistent asthma, uncomplicated: Secondary | ICD-10-CM | POA: Diagnosis not present

## 2018-04-15 ENCOUNTER — Other Ambulatory Visit: Payer: Self-pay | Admitting: Internal Medicine

## 2018-04-15 DIAGNOSIS — F32A Depression, unspecified: Secondary | ICD-10-CM

## 2018-04-15 DIAGNOSIS — F329 Major depressive disorder, single episode, unspecified: Secondary | ICD-10-CM

## 2018-04-23 DIAGNOSIS — M5412 Radiculopathy, cervical region: Secondary | ICD-10-CM | POA: Diagnosis not present

## 2018-04-23 DIAGNOSIS — M542 Cervicalgia: Secondary | ICD-10-CM | POA: Diagnosis not present

## 2018-04-30 DIAGNOSIS — M542 Cervicalgia: Secondary | ICD-10-CM | POA: Diagnosis not present

## 2018-05-12 DIAGNOSIS — M542 Cervicalgia: Secondary | ICD-10-CM | POA: Diagnosis not present

## 2018-05-12 DIAGNOSIS — I10 Essential (primary) hypertension: Secondary | ICD-10-CM | POA: Diagnosis not present

## 2018-05-12 DIAGNOSIS — Z6827 Body mass index (BMI) 27.0-27.9, adult: Secondary | ICD-10-CM | POA: Diagnosis not present

## 2018-06-03 DIAGNOSIS — R159 Full incontinence of feces: Secondary | ICD-10-CM | POA: Diagnosis not present

## 2018-06-18 ENCOUNTER — Other Ambulatory Visit: Payer: Self-pay | Admitting: Internal Medicine

## 2018-06-23 DIAGNOSIS — M5412 Radiculopathy, cervical region: Secondary | ICD-10-CM | POA: Diagnosis not present

## 2018-07-09 DIAGNOSIS — M542 Cervicalgia: Secondary | ICD-10-CM | POA: Diagnosis not present

## 2018-07-09 DIAGNOSIS — E1169 Type 2 diabetes mellitus with other specified complication: Secondary | ICD-10-CM | POA: Diagnosis not present

## 2018-07-09 DIAGNOSIS — J4540 Moderate persistent asthma, uncomplicated: Secondary | ICD-10-CM | POA: Diagnosis not present

## 2018-07-09 DIAGNOSIS — E559 Vitamin D deficiency, unspecified: Secondary | ICD-10-CM | POA: Diagnosis not present

## 2018-07-09 DIAGNOSIS — I10 Essential (primary) hypertension: Secondary | ICD-10-CM | POA: Diagnosis not present

## 2018-07-09 DIAGNOSIS — Z23 Encounter for immunization: Secondary | ICD-10-CM | POA: Diagnosis not present

## 2018-07-09 DIAGNOSIS — F324 Major depressive disorder, single episode, in partial remission: Secondary | ICD-10-CM | POA: Diagnosis not present

## 2018-07-09 DIAGNOSIS — Z79899 Other long term (current) drug therapy: Secondary | ICD-10-CM | POA: Diagnosis not present

## 2018-07-15 DIAGNOSIS — R159 Full incontinence of feces: Secondary | ICD-10-CM | POA: Diagnosis not present

## 2018-07-27 DIAGNOSIS — M5412 Radiculopathy, cervical region: Secondary | ICD-10-CM | POA: Diagnosis not present

## 2018-08-11 ENCOUNTER — Other Ambulatory Visit: Payer: Self-pay

## 2018-09-01 DIAGNOSIS — R159 Full incontinence of feces: Secondary | ICD-10-CM | POA: Diagnosis not present

## 2018-09-01 DIAGNOSIS — Z85038 Personal history of other malignant neoplasm of large intestine: Secondary | ICD-10-CM | POA: Diagnosis not present

## 2018-09-02 DIAGNOSIS — M542 Cervicalgia: Secondary | ICD-10-CM | POA: Diagnosis not present

## 2018-09-02 DIAGNOSIS — M5412 Radiculopathy, cervical region: Secondary | ICD-10-CM | POA: Diagnosis not present

## 2018-09-02 DIAGNOSIS — Z6828 Body mass index (BMI) 28.0-28.9, adult: Secondary | ICD-10-CM | POA: Diagnosis not present

## 2018-09-02 DIAGNOSIS — I10 Essential (primary) hypertension: Secondary | ICD-10-CM | POA: Diagnosis not present

## 2018-10-20 DIAGNOSIS — H5213 Myopia, bilateral: Secondary | ICD-10-CM | POA: Diagnosis not present

## 2018-10-20 DIAGNOSIS — Z961 Presence of intraocular lens: Secondary | ICD-10-CM | POA: Diagnosis not present

## 2018-10-20 DIAGNOSIS — E119 Type 2 diabetes mellitus without complications: Secondary | ICD-10-CM | POA: Diagnosis not present

## 2018-10-30 ENCOUNTER — Encounter: Payer: Self-pay | Admitting: Physician Assistant

## 2019-01-19 DIAGNOSIS — Z7984 Long term (current) use of oral hypoglycemic drugs: Secondary | ICD-10-CM | POA: Diagnosis not present

## 2019-01-19 DIAGNOSIS — E1122 Type 2 diabetes mellitus with diabetic chronic kidney disease: Secondary | ICD-10-CM | POA: Diagnosis not present

## 2019-01-19 DIAGNOSIS — I10 Essential (primary) hypertension: Secondary | ICD-10-CM | POA: Diagnosis not present

## 2019-01-19 DIAGNOSIS — F324 Major depressive disorder, single episode, in partial remission: Secondary | ICD-10-CM | POA: Diagnosis not present

## 2019-01-19 DIAGNOSIS — M791 Myalgia, unspecified site: Secondary | ICD-10-CM | POA: Diagnosis not present

## 2019-01-19 DIAGNOSIS — E785 Hyperlipidemia, unspecified: Secondary | ICD-10-CM | POA: Diagnosis not present

## 2019-04-09 DIAGNOSIS — N182 Chronic kidney disease, stage 2 (mild): Secondary | ICD-10-CM | POA: Diagnosis not present

## 2019-04-09 DIAGNOSIS — F322 Major depressive disorder, single episode, severe without psychotic features: Secondary | ICD-10-CM | POA: Diagnosis not present

## 2019-04-09 DIAGNOSIS — E785 Hyperlipidemia, unspecified: Secondary | ICD-10-CM | POA: Diagnosis not present

## 2019-04-09 DIAGNOSIS — I700 Atherosclerosis of aorta: Secondary | ICD-10-CM | POA: Diagnosis not present

## 2019-04-09 DIAGNOSIS — E1142 Type 2 diabetes mellitus with diabetic polyneuropathy: Secondary | ICD-10-CM | POA: Diagnosis not present

## 2019-04-09 DIAGNOSIS — Z7984 Long term (current) use of oral hypoglycemic drugs: Secondary | ICD-10-CM | POA: Diagnosis not present

## 2019-04-09 DIAGNOSIS — E1122 Type 2 diabetes mellitus with diabetic chronic kidney disease: Secondary | ICD-10-CM | POA: Diagnosis not present

## 2019-04-09 DIAGNOSIS — J4530 Mild persistent asthma, uncomplicated: Secondary | ICD-10-CM | POA: Diagnosis not present

## 2019-04-09 DIAGNOSIS — Z85038 Personal history of other malignant neoplasm of large intestine: Secondary | ICD-10-CM | POA: Diagnosis not present

## 2019-04-09 DIAGNOSIS — I10 Essential (primary) hypertension: Secondary | ICD-10-CM | POA: Diagnosis not present

## 2019-06-23 ENCOUNTER — Other Ambulatory Visit: Payer: Self-pay | Admitting: Family Medicine

## 2019-06-23 DIAGNOSIS — Z1231 Encounter for screening mammogram for malignant neoplasm of breast: Secondary | ICD-10-CM

## 2019-06-25 DIAGNOSIS — Z23 Encounter for immunization: Secondary | ICD-10-CM | POA: Diagnosis not present

## 2019-06-28 ENCOUNTER — Other Ambulatory Visit: Payer: Self-pay

## 2019-06-28 ENCOUNTER — Ambulatory Visit
Admission: RE | Admit: 2019-06-28 | Discharge: 2019-06-28 | Disposition: A | Discharge status: Home / Self Care | Payer: Medicare Other | Source: Ambulatory Visit | Attending: Family Medicine | Admitting: Family Medicine

## 2019-06-28 DIAGNOSIS — Z1231 Encounter for screening mammogram for malignant neoplasm of breast: Secondary | ICD-10-CM | POA: Diagnosis not present

## 2019-08-18 DIAGNOSIS — K219 Gastro-esophageal reflux disease without esophagitis: Secondary | ICD-10-CM | POA: Diagnosis not present

## 2019-08-18 DIAGNOSIS — R159 Full incontinence of feces: Secondary | ICD-10-CM | POA: Diagnosis not present

## 2019-08-18 DIAGNOSIS — Z85038 Personal history of other malignant neoplasm of large intestine: Secondary | ICD-10-CM | POA: Diagnosis not present

## 2019-10-25 DIAGNOSIS — H26493 Other secondary cataract, bilateral: Secondary | ICD-10-CM | POA: Diagnosis not present

## 2019-10-25 DIAGNOSIS — H5213 Myopia, bilateral: Secondary | ICD-10-CM | POA: Diagnosis not present

## 2019-10-25 DIAGNOSIS — E103293 Type 1 diabetes mellitus with mild nonproliferative diabetic retinopathy without macular edema, bilateral: Secondary | ICD-10-CM | POA: Diagnosis not present

## 2019-12-29 ENCOUNTER — Encounter: Payer: Self-pay | Admitting: Orthopedic Surgery

## 2019-12-29 ENCOUNTER — Ambulatory Visit (INDEPENDENT_AMBULATORY_CARE_PROVIDER_SITE_OTHER): Payer: PPO

## 2019-12-29 ENCOUNTER — Ambulatory Visit: Payer: PPO | Admitting: Orthopedic Surgery

## 2019-12-29 ENCOUNTER — Other Ambulatory Visit: Payer: Self-pay

## 2019-12-29 ENCOUNTER — Ambulatory Visit: Payer: Self-pay

## 2019-12-29 DIAGNOSIS — M25562 Pain in left knee: Secondary | ICD-10-CM | POA: Diagnosis not present

## 2019-12-29 DIAGNOSIS — M25462 Effusion, left knee: Secondary | ICD-10-CM | POA: Diagnosis not present

## 2019-12-29 DIAGNOSIS — M25561 Pain in right knee: Secondary | ICD-10-CM

## 2019-12-29 MED ORDER — METHYLPREDNISOLONE ACETATE 40 MG/ML IJ SUSP
40.0000 mg | INTRAMUSCULAR | Status: AC | PRN
  Administered 2019-12-29: 40 mg via INTRA_ARTICULAR

## 2019-12-29 MED ORDER — BUPIVACAINE HCL 0.25 % IJ SOLN
4.0000 mL | INTRAMUSCULAR | Status: AC | PRN
  Administered 2019-12-29: 13:00:00 4 mL via INTRA_ARTICULAR

## 2019-12-29 MED ORDER — LIDOCAINE HCL 1 % IJ SOLN
5.0000 mL | INTRAMUSCULAR | Status: AC | PRN
  Administered 2019-12-29: 13:00:00 5 mL

## 2019-12-29 NOTE — Progress Notes (Signed)
Office Visit Note   Patient: Nancy Myers           Date of Birth: Mar 17, 1946           MRN: [id number] Visit Date: 12/29/2019 Requested by: Jonathon Jordan, MD [address],  [place] [id number] PCP: Jonathon Jordan, MD  Subjective: Chief Complaint  Patient presents with  . Right Knee - Pain  . Left Knee - Pain    HPI: Nancy Myers is a patient with bilateral knee pain left worse than right.  She has a known history of right knee mild to moderate arthritis.  Left knee has been hurting her more for the last 3 weeks.  Denies any history of injury but she does report some swelling affecting that left knee.  Tried Aspercreme and other topicals.  She does not really want to take any medication.  Denies any discrete mechanical symptoms.  Describes an aching pain inside the left knee.  She was working and she is currently looking for employment but she is not too keen on prolonged standing type of work.              ROS: All systems reviewed are negative as they relate to the chief complaint within the history of present illness.  Patient denies  fevers or chills.   Assessment & Plan: Visit Diagnoses:  1. Pain in both knees, unspecified chronicity     Plan: Impression is bilateral mild to moderate arthritis with maintenance of joint space.  Effusion present on the left.  That is aspirated and injected today.  I think this can help the patient.  If not we can always consider other avenues of treatment and imaging but for now I think this should press the reset button on this relatively short duration of left knee symptoms.  Follow-Up Instructions: Return if symptoms worsen or fail to improve.   Orders:  Orders Placed This Encounter  Procedures  . XR Knee 1-2 Views Right  . XR KNEE 3 VIEW LEFT   No orders of the defined types were placed in this encounter.     Procedures: Large Joint Inj: L knee on 12/29/2019 12:33 PM Indications: diagnostic evaluation,  joint swelling and pain Details: 18 G 1.5 in needle, superolateral approach  Arthrogram: No  Medications: 5 mL lidocaine 1 %; 40 mg methylPREDNISolone acetate 40 MG/ML; 4 mL bupivacaine 0.25 % Outcome: tolerated well, no immediate complications Procedure, treatment alternatives, risks and benefits explained, specific risks discussed. Consent was given by the patient. Immediately prior to procedure a time out was called to verify the correct patient, procedure, equipment, support staff and site/side marked as required. Patient was prepped and draped in the usual sterile fashion.       Clinical Data: No additional findings.  Objective: Vital Signs: There were no vitals taken for this visit.  Physical Exam:   Constitutional: Patient appears well-developed HEENT:  Head: Normocephalic Eyes:EOM are normal Neck: Normal range of motion Cardiovascular: Normal rate Pulmonary/chest: Effort normal Neurologic: Patient is alert Skin: Skin is warm Psychiatric: Patient has normal mood and affect    Ortho Exam: Ortho exam demonstrates moderate effusion left knee no effusion right knee.  Collateral and cruciate ligaments are stable bilaterally.  No focal joint line tenderness on the left.  Range of motion lacks about 7 degrees of full extension on the left but has full extension on the right.  Flexion is to about 115 on the right and about 10  5-1 10 on the left.  Specialty Comments:  No specialty comments available.  Imaging: XR Knee 1-2 Views Right  Result Date: 12/29/2019 AP lateral merchant right knee reviewed.  Moderate tricompartmental degenerative changes noted with spurring in both the medial lateral and patellofemoral compartments.  Patellofemoral arthritis is worse on the right knee compared to the left.  Alignment intact.  Tibial screw present for ACL reconstruction on the right-hand side  XR KNEE 3 VIEW LEFT  Result Date: 12/29/2019 AP lateral merchant left knee reviewed.   Alignment intact.  Moderate degenerative changes present with spurring in both the medial lateral and patellofemoral compartments.  Patella is well centered within the trochlea.    PMFS History: Patient Active Problem List   Diagnosis Date Noted  . Medication management 03/31/2015  . B12 deficiency 12/22/2014  . History of colon cancer, no staging 12/22/2014  . Noncompliance with medication regimen 08/24/2014  . Hyperlipidemia   . 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 GERD (gastroesophageal reflux disease)   . IBS (irritable bowel syndrome)   . Vitamin D deficiency   . Anemia 08/08/2009  . Depression, major, in remission ([place]) 08/08/2009  . Essential hypertension 08/08/2009  . Asthmatic bronchitis without complication [id number]   Past Medical History:  Diagnosis Date  . Anemia   . Anxiety   . Arrhythmia   . Cancer [facility]) 1995   Colon Cancer  . Depression   . Diabetes mellitus without complication ([place])   . GERD (gastroesophageal reflux disease)   . Hyperlipidemia   . Hypertension   . IBS (irritable bowel syndrome)   . Scintillating scotoma of both eyes 08/16/2013  . Vitamin D deficiency     Family History  Problem Relation Age of Onset  . Heart disease Father        MI  . Stroke Mother   . Rheum arthritis Mother   . Heart attack Brother        half brother  . Heart attack Maternal Uncle        half maternal uncle  . Breast cancer Neg Hx     Past Surgical History:  Procedure Laterality Date  . ANTERIOR CRUCIATE LIGAMENT REPAIR Right 04-09-04   right knee  . BUNIONECTOMY Bilateral 02/1987  . BUNIONECTOMY Right 03-08-08   right foot  . CARPAL TUNNEL RELEASE Bilateral 09-30-01;10-21-01  . CATARACT EXTRACTION W/ INTRAOCULAR LENS  IMPLANT, BILATERAL Bilateral 12-07-09;02-15-10  . left foot  05-23-05  . left knee  07-26-02  . SHOULDER SURGERY Left    Social History   Occupational History  . 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 Smoking status: Former Smoker    Types: Cigarettes    Quit date: 09/23/1986    Years since quitting: 33.2  . Smokeless tobacco: Never Used  Substance and Sexual Activity  . Alcohol use: No  . Drug use: No  . Sexual activity: Not on file

## 2020-01-04 DIAGNOSIS — M542 Cervicalgia: Secondary | ICD-10-CM | POA: Diagnosis not present

## 2020-04-13 DIAGNOSIS — I10 Essential (primary) hypertension: Secondary | ICD-10-CM | POA: Diagnosis not present

## 2020-04-13 DIAGNOSIS — J4540 Moderate persistent asthma, uncomplicated: Secondary | ICD-10-CM | POA: Diagnosis not present

## 2020-04-13 DIAGNOSIS — Z79899 Other long term (current) drug therapy: Secondary | ICD-10-CM | POA: Diagnosis not present

## 2020-04-13 DIAGNOSIS — I700 Atherosclerosis of aorta: Secondary | ICD-10-CM | POA: Diagnosis not present

## 2020-04-13 DIAGNOSIS — Z0000 Encounter for general adult medical examination without abnormal findings: Secondary | ICD-10-CM | POA: Diagnosis not present

## 2020-04-13 DIAGNOSIS — E559 Vitamin D deficiency, unspecified: Secondary | ICD-10-CM | POA: Diagnosis not present

## 2020-04-13 DIAGNOSIS — Z85038 Personal history of other malignant neoplasm of large intestine: Secondary | ICD-10-CM | POA: Diagnosis not present

## 2020-04-13 DIAGNOSIS — E1122 Type 2 diabetes mellitus with diabetic chronic kidney disease: Secondary | ICD-10-CM | POA: Diagnosis not present

## 2020-04-13 DIAGNOSIS — K219 Gastro-esophageal reflux disease without esophagitis: Secondary | ICD-10-CM | POA: Diagnosis not present

## 2020-04-13 DIAGNOSIS — E785 Hyperlipidemia, unspecified: Secondary | ICD-10-CM | POA: Diagnosis not present

## 2020-04-13 DIAGNOSIS — N182 Chronic kidney disease, stage 2 (mild): Secondary | ICD-10-CM | POA: Diagnosis not present

## 2020-04-13 DIAGNOSIS — Z1159 Encounter for screening for other viral diseases: Secondary | ICD-10-CM | POA: Diagnosis not present

## 2020-06-14 ENCOUNTER — Other Ambulatory Visit: Payer: Self-pay | Admitting: Family Medicine

## 2020-06-14 DIAGNOSIS — Z1231 Encounter for screening mammogram for malignant neoplasm of breast: Secondary | ICD-10-CM

## 2020-06-16 ENCOUNTER — Ambulatory Visit: Payer: PPO | Admitting: Orthopedic Surgery

## 2020-06-16 ENCOUNTER — Encounter: Payer: Self-pay | Admitting: Orthopedic Surgery

## 2020-06-16 VITALS — Ht 60.0 in | Wt 142.0 lb

## 2020-06-16 DIAGNOSIS — M25462 Effusion, left knee: Secondary | ICD-10-CM

## 2020-06-17 ENCOUNTER — Encounter: Payer: Self-pay | Admitting: Orthopedic Surgery

## 2020-06-17 DIAGNOSIS — M25462 Effusion, left knee: Secondary | ICD-10-CM

## 2020-06-17 MED ORDER — BUPIVACAINE HCL 0.25 % IJ SOLN
4.0000 mL | INTRAMUSCULAR | Status: AC | PRN
  Administered 2020-06-17: 4 mL via INTRA_ARTICULAR

## 2020-06-17 MED ORDER — LIDOCAINE HCL 1 % IJ SOLN
5.0000 mL | INTRAMUSCULAR | Status: AC | PRN
  Administered 2020-06-17: 5 mL

## 2020-06-17 MED ORDER — METHYLPREDNISOLONE ACETATE 40 MG/ML IJ SUSP
40.0000 mg | INTRAMUSCULAR | Status: AC | PRN
  Administered 2020-06-17: 40 mg via INTRA_ARTICULAR

## 2020-06-17 NOTE — Progress Notes (Signed)
Office Visit Note   Patient: Nancy Myers           Date of Birth: 03-01-1946           MRN: [id number] Visit Date: 06/16/2020 Requested by: Jonathon Jordan, MD [place],  [place] [address] PCP: Jonathon Jordan, MD  Subjective: Chief Complaint  Patient presents with  . Right Knee - Pain  . Left Knee - Pain    HPI: Patient presents for follow-up bilateral knee pain.  She has had right knee ACL reconstruction and that knee hurts at times.  She has recurrent left knee effusion.  Has a history of aspiration and injection for 721.  Started working in June and had to stand all day.  She wanted to get back into the workforce but the standing job was too much for her left knee.  Takes occasional over-the-counter medication such as Tylenol but she does not take anti-inflammatories.  Denies any mechanical symptoms.              ROS: All systems reviewed are negative as they relate to the chief complaint within the history of present illness.  Patient denies  fevers or chills.   Assessment & Plan: Visit Diagnoses:  1. Effusion, left knee     Plan: Impression is effusion left knee.  Plan is aspiration and injection.  Radiographs do not show too much in terms of arthritis but she is developing a small flexion contracture.  Continue with nonweightbearing quad strengthening exercises.  Follow-up as needed.  Follow-Up Instructions: Return if symptoms worsen or fail to improve.   Orders:  No orders of the defined types were placed in this encounter.  No orders of the defined types were placed in this encounter.     Procedures: Large Joint Inj: L knee on 06/17/2020 9:09 AM Indications: diagnostic evaluation, joint swelling and pain Details: 18 G 1.5 in needle, superolateral approach  Arthrogram: No  Medications: 5 mL lidocaine 1 %; 40 mg methylPREDNISolone acetate 40 MG/ML; 4 mL bupivacaine 0.25 % Outcome: tolerated well, no immediate  complications Procedure, treatment alternatives, risks and benefits explained, specific risks discussed. Consent was given by the patient. Immediately prior to procedure a time out was called to verify the correct patient, procedure, equipment, support staff and site/side marked as required. Patient was prepped and draped in the usual sterile fashion.       Clinical Data: No additional findings.  Objective: Vital Signs: Ht 5' (1.524 m)   Wt 142 lb (64.4 kg)   BMI 27.73 kg/m   Physical Exam:   Constitutional: Patient appears well-developed HEENT:  Head: Normocephalic Eyes:EOM are normal Neck: Normal range of motion Cardiovascular: Normal rate Pulmonary/chest: Effort normal Neurologic: Patient is alert Skin: Skin is warm Psychiatric: Patient has normal mood and affect    Ortho Exam: Ortho exam demonstrates full active and passive range of motion of the right knee.  Left knee has a 5 degree flexion contracture but bends to about 120.  Moderate effusion is present.  Collateral and cruciate ligaments are stable.  No masses lymphadenopathy or skin changes noted in that right knee region or left knee region.  Specialty Comments:  No specialty comments available.  Imaging: No results found.   PMFS History: Patient Active Problem List   Diagnosis Date Noted  . Medication management 03/31/2015  . B12 deficiency 12/22/2014  . History of colon cancer, no staging 12/22/2014  . Noncompliance with medication regimen 08/24/2014  .  Hyperlipidemia   . 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 GERD (gastroesophageal reflux disease)   . IBS (irritable bowel syndrome)   . Vitamin D deficiency   . Anemia 08/08/2009  . Depression, major, in remission ([place]) 08/08/2009  . Essential hypertension 08/08/2009  . Asthmatic bronchitis without complication 37/85/8850   Past Medical History:  Diagnosis Date  . Anemia   . Anxiety   . Arrhythmia   . Cancer [facility]) 1995   Colon Cancer   . Depression   . Diabetes mellitus without complication ([place])   . GERD (gastroesophageal reflux disease)   . Hyperlipidemia   . Hypertension   . IBS (irritable bowel syndrome)   . Scintillating scotoma of both eyes 08/16/2013  . Vitamin D deficiency     Family History  Problem Relation Age of Onset  . Heart disease Father        MI  . Stroke Mother   . Rheum arthritis Mother   . Heart attack Brother        half brother  . Heart attack Maternal Uncle        half maternal uncle  . Breast cancer Neg Hx     Past Surgical History:  Procedure Laterality Date  . ANTERIOR CRUCIATE LIGAMENT REPAIR Right 04-09-04   right knee  . BUNIONECTOMY Bilateral 02/1987  . BUNIONECTOMY Right 03-08-08   right foot  . CARPAL TUNNEL RELEASE Bilateral 09-30-01;10-21-01  . CATARACT EXTRACTION W/ INTRAOCULAR LENS  IMPLANT, BILATERAL Bilateral 12-07-09;02-15-10  . left foot  05-23-05  . left knee  07-26-02  . SHOULDER SURGERY Left    Social History   Occupational History  . 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 Smoking status: Former Smoker    Types: Cigarettes    Quit date: 09/23/1986    Years since quitting: 33.7  . Smokeless tobacco: Never Used  Substance and Sexual Activity  . Alcohol use: No  . Drug use: No  . Sexual activity: Not on file

## 2020-06-22 DIAGNOSIS — Z23 Encounter for immunization: Secondary | ICD-10-CM | POA: Diagnosis not present

## 2020-06-30 ENCOUNTER — Ambulatory Visit: Payer: PPO

## 2020-07-12 ENCOUNTER — Other Ambulatory Visit: Payer: Self-pay | Admitting: Family Medicine

## 2020-07-12 DIAGNOSIS — E2839 Other primary ovarian failure: Secondary | ICD-10-CM

## 2020-08-07 ENCOUNTER — Ambulatory Visit
Admission: RE | Admit: 2020-08-07 | Discharge: 2020-08-07 | Disposition: A | Discharge status: Home / Self Care | Payer: PPO | Source: Ambulatory Visit | Attending: Family Medicine | Admitting: Family Medicine

## 2020-08-07 ENCOUNTER — Other Ambulatory Visit: Payer: Self-pay

## 2020-08-07 DIAGNOSIS — Z1231 Encounter for screening mammogram for malignant neoplasm of breast: Secondary | ICD-10-CM | POA: Diagnosis not present

## 2020-08-08 DIAGNOSIS — Z85038 Personal history of other malignant neoplasm of large intestine: Secondary | ICD-10-CM | POA: Diagnosis not present

## 2020-08-08 DIAGNOSIS — Z01812 Encounter for preprocedural laboratory examination: Secondary | ICD-10-CM | POA: Diagnosis not present

## 2020-08-08 DIAGNOSIS — K219 Gastro-esophageal reflux disease without esophagitis: Secondary | ICD-10-CM | POA: Diagnosis not present

## 2020-08-08 DIAGNOSIS — K625 Hemorrhage of anus and rectum: Secondary | ICD-10-CM | POA: Diagnosis not present

## 2020-08-09 DIAGNOSIS — K219 Gastro-esophageal reflux disease without esophagitis: Secondary | ICD-10-CM | POA: Diagnosis not present

## 2020-08-09 DIAGNOSIS — D124 Benign neoplasm of descending colon: Secondary | ICD-10-CM | POA: Diagnosis not present

## 2020-08-09 DIAGNOSIS — K5730 Diverticulosis of large intestine without perforation or abscess without bleeding: Secondary | ICD-10-CM | POA: Diagnosis not present

## 2020-08-09 DIAGNOSIS — Z85038 Personal history of other malignant neoplasm of large intestine: Secondary | ICD-10-CM | POA: Diagnosis not present

## 2020-08-09 DIAGNOSIS — K449 Diaphragmatic hernia without obstruction or gangrene: Secondary | ICD-10-CM | POA: Diagnosis not present

## 2020-08-11 DIAGNOSIS — D124 Benign neoplasm of descending colon: Secondary | ICD-10-CM | POA: Diagnosis not present

## 2020-10-25 ENCOUNTER — Other Ambulatory Visit: Payer: PPO

## 2021-01-04 ENCOUNTER — Other Ambulatory Visit: Payer: PPO

## 2021-01-18 ENCOUNTER — Other Ambulatory Visit (HOSPITAL_COMMUNITY): Payer: Self-pay | Admitting: Family Medicine

## 2021-01-19 ENCOUNTER — Other Ambulatory Visit: Payer: Self-pay

## 2021-01-19 ENCOUNTER — Ambulatory Visit (HOSPITAL_COMMUNITY)
Admission: RE | Admit: 2021-01-19 | Discharge: 2021-01-19 | Disposition: A | Discharge status: Home / Self Care | Payer: Medicare HMO | Source: Ambulatory Visit | Attending: Family Medicine | Admitting: Family Medicine

## 2021-01-19 DIAGNOSIS — E1169 Type 2 diabetes mellitus with other specified complication: Secondary | ICD-10-CM | POA: Insufficient documentation

## 2021-01-19 DIAGNOSIS — E785 Hyperlipidemia, unspecified: Secondary | ICD-10-CM | POA: Insufficient documentation

## 2021-01-19 DIAGNOSIS — I700 Atherosclerosis of aorta: Secondary | ICD-10-CM | POA: Insufficient documentation

## 2021-01-19 DIAGNOSIS — R918 Other nonspecific abnormal finding of lung field: Secondary | ICD-10-CM | POA: Insufficient documentation

## 2021-01-19 DIAGNOSIS — Z8249 Family history of ischemic heart disease and other diseases of the circulatory system: Secondary | ICD-10-CM | POA: Insufficient documentation

## 2021-01-23 ENCOUNTER — Other Ambulatory Visit: Payer: Medicare HMO

## 2021-01-24 ENCOUNTER — Other Ambulatory Visit: Payer: Self-pay

## 2021-01-24 ENCOUNTER — Ambulatory Visit
Admission: RE | Admit: 2021-01-24 | Discharge: 2021-01-24 | Disposition: A | Discharge status: Home / Self Care | Payer: Medicare HMO | Source: Ambulatory Visit | Attending: Family Medicine | Admitting: Family Medicine

## 2021-01-24 DIAGNOSIS — E2839 Other primary ovarian failure: Secondary | ICD-10-CM

## 2021-01-24 DIAGNOSIS — Z780 Asymptomatic menopausal state: Secondary | ICD-10-CM | POA: Diagnosis not present

## 2021-01-31 DIAGNOSIS — K219 Gastro-esophageal reflux disease without esophagitis: Secondary | ICD-10-CM | POA: Diagnosis not present

## 2021-01-31 DIAGNOSIS — R911 Solitary pulmonary nodule: Secondary | ICD-10-CM | POA: Diagnosis not present

## 2021-01-31 DIAGNOSIS — I700 Atherosclerosis of aorta: Secondary | ICD-10-CM | POA: Diagnosis not present

## 2021-02-07 DIAGNOSIS — F419 Anxiety disorder, unspecified: Secondary | ICD-10-CM | POA: Diagnosis not present

## 2021-02-07 DIAGNOSIS — R002 Palpitations: Secondary | ICD-10-CM | POA: Diagnosis not present

## 2021-02-12 DIAGNOSIS — I700 Atherosclerosis of aorta: Secondary | ICD-10-CM | POA: Diagnosis not present

## 2021-02-12 DIAGNOSIS — F330 Major depressive disorder, recurrent, mild: Secondary | ICD-10-CM | POA: Diagnosis not present

## 2021-02-12 DIAGNOSIS — R931 Abnormal findings on diagnostic imaging of heart and coronary circulation: Secondary | ICD-10-CM | POA: Diagnosis not present

## 2021-04-25 DIAGNOSIS — Z0000 Encounter for general adult medical examination without abnormal findings: Secondary | ICD-10-CM | POA: Diagnosis not present

## 2021-04-25 DIAGNOSIS — Z23 Encounter for immunization: Secondary | ICD-10-CM | POA: Diagnosis not present

## 2021-04-25 DIAGNOSIS — Z79899 Other long term (current) drug therapy: Secondary | ICD-10-CM | POA: Diagnosis not present

## 2021-04-25 DIAGNOSIS — E1169 Type 2 diabetes mellitus with other specified complication: Secondary | ICD-10-CM | POA: Diagnosis not present

## 2021-04-25 DIAGNOSIS — E1142 Type 2 diabetes mellitus with diabetic polyneuropathy: Secondary | ICD-10-CM | POA: Diagnosis not present

## 2021-04-25 DIAGNOSIS — E538 Deficiency of other specified B group vitamins: Secondary | ICD-10-CM | POA: Diagnosis not present

## 2021-04-25 DIAGNOSIS — I10 Essential (primary) hypertension: Secondary | ICD-10-CM | POA: Diagnosis not present

## 2021-04-25 DIAGNOSIS — R931 Abnormal findings on diagnostic imaging of heart and coronary circulation: Secondary | ICD-10-CM | POA: Diagnosis not present

## 2021-04-25 DIAGNOSIS — E559 Vitamin D deficiency, unspecified: Secondary | ICD-10-CM | POA: Diagnosis not present

## 2021-04-25 DIAGNOSIS — Z7984 Long term (current) use of oral hypoglycemic drugs: Secondary | ICD-10-CM | POA: Diagnosis not present

## 2021-06-29 ENCOUNTER — Other Ambulatory Visit: Payer: Self-pay | Admitting: Family Medicine

## 2021-06-29 DIAGNOSIS — Z1231 Encounter for screening mammogram for malignant neoplasm of breast: Secondary | ICD-10-CM

## 2021-07-19 DIAGNOSIS — F330 Major depressive disorder, recurrent, mild: Secondary | ICD-10-CM | POA: Diagnosis not present

## 2021-07-19 DIAGNOSIS — R413 Other amnesia: Secondary | ICD-10-CM | POA: Diagnosis not present

## 2021-08-08 ENCOUNTER — Ambulatory Visit
Admission: RE | Admit: 2021-08-08 | Discharge: 2021-08-08 | Disposition: A | Discharge status: Home / Self Care | Payer: Medicare HMO | Source: Ambulatory Visit | Attending: Family Medicine | Admitting: Family Medicine

## 2021-08-08 ENCOUNTER — Other Ambulatory Visit: Payer: Self-pay

## 2021-08-08 DIAGNOSIS — Z1231 Encounter for screening mammogram for malignant neoplasm of breast: Secondary | ICD-10-CM | POA: Diagnosis not present

## 2021-08-27 ENCOUNTER — Other Ambulatory Visit: Payer: Self-pay

## 2021-08-27 ENCOUNTER — Ambulatory Visit (INDEPENDENT_AMBULATORY_CARE_PROVIDER_SITE_OTHER): Payer: Medicare HMO

## 2021-08-27 ENCOUNTER — Ambulatory Visit: Payer: Medicare HMO | Admitting: Surgical

## 2021-08-27 DIAGNOSIS — M25551 Pain in right hip: Secondary | ICD-10-CM

## 2021-08-27 DIAGNOSIS — M79672 Pain in left foot: Secondary | ICD-10-CM

## 2021-08-27 DIAGNOSIS — M25552 Pain in left hip: Secondary | ICD-10-CM | POA: Diagnosis not present

## 2021-08-28 ENCOUNTER — Encounter: Payer: Self-pay | Admitting: Orthopedic Surgery

## 2021-08-28 NOTE — Progress Notes (Signed)
Office Visit Note   Patient: Nancy Myers           Date of Birth: 1946-01-07           MRN: [id number] Visit Date: 08/27/2021 Requested by: Jonathon Jordan, MD [place],   [id number] PCP: Jonathon Jordan, MD  Subjective: Chief Complaint  Patient presents with   Other    Follow up bilat knee     HPI: [name] is a 75 y.o. female who presents to the office complaining of bilateral knee pain.  Patient complains of pain that began in September with insidious onset.  No injury or falls recently.  She has history of bilateral knee osteoarthritis and describes an aching pain "on the inside" of her knees.  This new pain however in the last several months is more localized to her buttocks bilaterally with radiation down the posterior thigh.  No burning sensation, numbness/tingling, low back pain, weakness, groin pain.  She has been using heating pad and topical agents with occasional Tylenol with some relief.  She feels 30% improvement in the last month.  She has no radiation of pain past the knee..                ROS: All systems reviewed are negative as they relate to the chief complaint within the history of present illness.  Patient denies fevers or chills.  Assessment & Plan: Visit Diagnoses:  1. Bilateral hip pain   2. Pain in left foot     Plan: Patient is a 75 year old female who presents for evaluation of bilateral gluteal pain.  Most of her pain is when she sits and she localizes it distinctly to her buttocks with some radiation of pain down the posterior thigh.  She has no associated back pain, numbness/ting, weakness.  Seems most of her pain is overlying the ischial tuberosity on the right-hand side.  Radiographs of hips are negative for any significant osteoarthritis or any fracture/dislocation.  Differential diagnosis includes proximal hamstring tendinitis versus referred pain from the lumbar spine versus ischial tuberosity  bursitis.  No real significant low back pain or any weakness or numbness/tingling that would cause concern for lumbar spine pathology.  Discussed options available to patient including physical therapy, doing nothing, MRI pelvis.  After discussion, patient would like to just continue the course of what she is doing as she has had 30% relief of symptoms.  Recommended she contact the office in mid January if she has no continued improvement of her symptoms.  She agreed with plan.  She also wanted radiographs of her left foot today with a palpable area of "swelling" in the midfoot.  Radiographs demonstrated hardware in good position from prior surgery with significant midfoot osteophytes on lateral view.  She does not want any intervention for this problem.  Follow-up as needed.  Follow-Up Instructions: No follow-ups on file.   Orders:  Orders Placed This Encounter  Procedures   XR HIPS BILAT W OR W/O PELVIS 3-4 VIEWS   XR Foot Complete Left   No orders of the defined types were placed in this encounter.     Procedures: No procedures performed   Clinical Data: No additional findings.  Objective: Vital Signs: There were no vitals taken for this visit.  Physical Exam:  Constitutional: Patient appears well-developed HEENT:  Head: Normocephalic Eyes:EOM are normal Neck: Normal range of motion Cardiovascular: Normal rate Pulmonary/chest: Effort normal Neurologic: Patient is alert Skin: Skin is  warm Psychiatric: Patient has normal mood and affect  Ortho Exam: Ortho exam demonstrates bilateral knee without effusion.  Tenderness over the medial joint line of both knees.  No tenderness over the lateral joint line.  Able to perform straight leg raise with both legs.  Negative straight leg raise.  Increased pain in the left hip slightly with FADIR test.  No increased pain in the right hip with FADIR test.  Negative Stinchfield sign bilaterally.  Mild tenderness over bilateral trochanters.  No  tenderness over the ischial tuberosity on the left but there is moderate tenderness over the ischial tuberosity on the right.  She has excellent strength of hip flexion, quadricep, hamstring, dorsiflexion, plantarflexion, EHL rated 5/5.  No loss of sensation with full intact sensation to all dermatomes of bilateral lower extremities.  Specialty Comments:  No specialty comments available.  Imaging: No results found.   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    GERD (gastroesophageal reflux disease)    IBS (irritable bowel syndrome)    Vitamin D deficiency    Anemia 08/08/2009   Depression, major, in remission ([place]) 08/08/2009   Essential hypertension 08/08/2009   Asthmatic bronchitis without complication 92/92/4462   Past Medical History:  Diagnosis Date   Anemia    Anxiety    Arrhythmia    Cancer ([place]) 1995   Colon Cancer   Depression    Diabetes mellitus without complication ([facility])    GERD (gastroesophageal reflux disease)    Hyperlipidemia    Hypertension    IBS (irritable bowel syndrome)    Scintillating scotoma of both eyes 08/16/2013   Vitamin D deficiency     Family History  Problem Relation Age of Onset   Heart disease Father        MI   Stroke Mother    Rheum arthritis Mother    Heart attack Brother        half brother   Heart attack Maternal Uncle        half maternal uncle   Breast cancer Neg Hx     Past Surgical History:  Procedure Laterality Date   ANTERIOR CRUCIATE LIGAMENT REPAIR Right 04-09-04   right knee   BUNIONECTOMY Bilateral 02/1987   BUNIONECTOMY Right 03-08-08   right foot   CARPAL TUNNEL RELEASE Bilateral 09-30-01;10-21-01   CATARACT EXTRACTION W/ INTRAOCULAR LENS  IMPLANT, BILATERAL Bilateral  12-07-09;02-15-10   left foot  05-23-05   left knee  07-26-02   SHOULDER SURGERY Left    Social History   Occupational History   Occupation: Designer, 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

## 2021-10-19 DIAGNOSIS — J4530 Mild persistent asthma, uncomplicated: Secondary | ICD-10-CM | POA: Diagnosis not present

## 2021-10-19 DIAGNOSIS — M9983 Other biomechanical lesions of lumbar region: Secondary | ICD-10-CM | POA: Diagnosis not present

## 2021-10-19 DIAGNOSIS — Z23 Encounter for immunization: Secondary | ICD-10-CM | POA: Diagnosis not present

## 2021-10-19 DIAGNOSIS — E538 Deficiency of other specified B group vitamins: Secondary | ICD-10-CM | POA: Diagnosis not present

## 2021-10-19 DIAGNOSIS — E1169 Type 2 diabetes mellitus with other specified complication: Secondary | ICD-10-CM | POA: Diagnosis not present

## 2021-10-25 ENCOUNTER — Telehealth: Payer: Self-pay | Admitting: Surgical

## 2021-10-25 NOTE — Telephone Encounter (Signed)
Patient called asked if she can get set up for the MRI ?  Patient said she is still having some pain and stiffness.  The number to contact patient is [phone]

## 2021-10-25 NOTE — Telephone Encounter (Signed)
Okay for this if her pain is similar to how it was at time of last appointment and she has no back pain or radicular pain that's new

## 2021-10-25 NOTE — Telephone Encounter (Signed)
Tried calling patient to discuss-busy signal each time. Will try to reach patient again later.

## 2021-10-26 NOTE — Telephone Encounter (Signed)
I called and lm on vm to advise of below. Asked pt to call the office back to confirm that this pain was what she was experiencing at last visit an not something new. If it is the same as before then we can proceed with ordering the MRI.

## 2021-11-01 ENCOUNTER — Telehealth: Payer: Self-pay | Admitting: Surgical

## 2021-11-01 NOTE — Telephone Encounter (Signed)
Patient called. Asking about having a MRI done. Her call back number is [phone]

## 2021-11-02 NOTE — Telephone Encounter (Signed)
IC no answer. LMVM asking for her to call back.

## 2021-11-22 ENCOUNTER — Telehealth: Payer: Self-pay | Admitting: Orthopedic Surgery

## 2021-11-22 NOTE — Telephone Encounter (Signed)
Patient came into office to drop off work restriction paperwork to be completed. She also stated she would like to move forward with ordering MRI. ?

## 2021-11-22 NOTE — Telephone Encounter (Signed)
Hard to say.  Lurena Joiner saw her last.  Seem like her symptoms were worse with sitting by his neck.  May want to ask him but really sort of hard to match his note with this request

## 2021-11-22 NOTE — Telephone Encounter (Signed)
Patient dropped off [place] forms. she is requesting work restrictions of " only to work 4 hrs per day with no lifting greater than 20 lbs. If this is okay, please provide note. ? ?Forms to [employer]. ?

## 2021-11-23 NOTE — Telephone Encounter (Signed)
Noted for [employer] ?

## 2021-11-23 NOTE — Telephone Encounter (Signed)
IC no answer. LMVM advising needed to call office to schedule follow up appt per note from [place] in previous message-patient not seen since early Dec 2022 ?

## 2021-11-23 NOTE — Telephone Encounter (Signed)
Agreed, if she needs work note or still wants MRI scan as she called about in the past, she probably needs repeat eval by me or Dr Marlou Sa

## 2021-11-26 ENCOUNTER — Other Ambulatory Visit: Payer: Self-pay

## 2021-11-26 ENCOUNTER — Ambulatory Visit: Payer: Medicare HMO | Admitting: Orthopedic Surgery

## 2021-11-26 ENCOUNTER — Encounter: Payer: Self-pay | Admitting: Orthopedic Surgery

## 2021-11-26 DIAGNOSIS — M79605 Pain in left leg: Secondary | ICD-10-CM

## 2021-11-26 DIAGNOSIS — M79604 Pain in right leg: Secondary | ICD-10-CM

## 2021-11-26 NOTE — Progress Notes (Signed)
? ?Office Visit Note ?  ?Patient: Nancy Myers           ?Date of Birth: May 24, 1946           ?MRN: [id number] ?Visit Date: 11/26/2021 ?Requested by: Jonathon Jordan, MD ?[address] ?[address] ?[place],  [place] [address] ?PCP: Jonathon Jordan, MD ? ?Subjective: ?Chief Complaint  ?Patient presents with  ? Other  ?  Bilateral leg pain  ? ? ?HPI: Patient presents for evaluation of hip and back pain.  She has had this problem since September.  Had office visit 12 5 which is reviewed.  Not really doing any better.  Reports a lot of aching pain from the buttocks down to the back of the legs.  She has to walk in a flexed position.  She feels like her legs are tight and weak.  She is using IcyHot and Biofreeze.  She wants to be able to work at her job at [employer].  She thinks she may be able to do it 4 hours a day with no lifting more than 20 pounds.  Her symptoms are worse with standing and walking.  She does 11,000 and 14,000 steps per 4-hour shift by her report.  She does have a known history of knee arthritis. ?             ?ROS: All systems reviewed are negative as they relate to the chief complaint within the history of present illness.  Patient denies  fevers or chills. ? ? ?Assessment & Plan: ?Visit Diagnoses:  ?1. Bilateral leg pain   ? ? ?Plan: Impression is bilateral leg pain which looks to be spinal stenosis.  Plan is note for job accommodations 4 hours/day with no lifting more than 20 pounds for 6 months.  MRI lumbar spine evaluate spinal stenosis ? ?Follow-Up Instructions: No follow-ups on file.  ? ?Orders:  ?Orders Placed This Encounter  ?Procedures  ? MR Lumbar Spine w/o contrast  ? ?No orders of the defined types were placed in this encounter. ? ? ? ? Procedures: ?No procedures performed ? ? ?Clinical Data: ?No additional findings. ? ?Objective: ?Vital Signs: There were no vitals taken for this visit. ? ?Physical Exam:  ? ?Constitutional: Patient appears well-developed ?HEENT:  ?Head:  Normocephalic ?Eyes:EOM are normal ?Neck: Normal range of motion ?Cardiovascular: Normal rate ?Pulmonary/chest: Effort normal ?Neurologic: Patient is alert ?Skin: Skin is warm ?Psychiatric: Patient has normal mood and affect ? ? ?Ortho Exam: Ortho exam demonstrates full active and passive range of motion of the ankles hips and knees.  She has a slight flexion contracture in both knees.  No nerve root tension signs and no groin pain with internal and external rotation of either hip.  5 out of 5 ankle dorsiflexion plantarflexion quad and hamstring strength.  No trochanteric tenderness is present.  Pedal pulses palpable.  No other masses lymphadenopathy or skin changes noted in the knee or hip region. ? ?Specialty Comments:  ?No specialty comments available. ? ?Imaging: ?No results found. ? ? ?PMFS History: ?Patient Active Problem List  ? Diagnosis Date Noted  ? Medication management 03/31/2015  ? B12 deficiency 12/22/2014  ? History of colon cancer, no staging 12/22/2014  ? Noncompliance with medication regimen 08/24/2014  ? Hyperlipidemia   ? 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 GERD (gastroesophageal reflux disease)   ? IBS (irritable bowel syndrome)   ? Vitamin D deficiency   ? Anemia 08/08/2009  ? Depression, major, in remission ([place])  08/08/2009  ? Essential hypertension 08/08/2009  ? Asthmatic bronchitis without complication 50/56/9794  ? ?Past Medical History:  ?Diagnosis Date  ? Anemia   ? Anxiety   ? Arrhythmia   ? Cancer [facility]) 1995  ? Colon Cancer  ? Depression   ? Diabetes mellitus without complication ([place])   ? GERD (gastroesophageal reflux disease)   ? Hyperlipidemia   ? Hypertension   ? IBS (irritable bowel syndrome)   ? Scintillating scotoma of both eyes 08/16/2013  ? Vitamin D deficiency   ?  ?Family History  ?Problem Relation Age of Onset  ? Heart disease Father   ?     MI  ? Stroke Mother   ? Rheum arthritis Mother   ? Heart attack Brother   ?     half brother  ? Heart attack Maternal Uncle    ?     half maternal uncle  ? Breast cancer Neg Hx   ?  ?Past Surgical History:  ?Procedure Laterality Date  ? ANTERIOR CRUCIATE LIGAMENT REPAIR Right 04-09-04  ? right knee  ? BUNIONECTOMY Bilateral 02/1987  ? BUNIONECTOMY Right 03-08-08  ? right foot  ? CARPAL TUNNEL RELEASE Bilateral 09-30-01;10-21-01  ? CATARACT EXTRACTION W/ INTRAOCULAR LENS  IMPLANT, BILATERAL Bilateral 12-07-09;02-15-10  ? left foot  05-23-05  ? left knee  07-26-02  ? SHOULDER SURGERY Left   ? ?Social History  ? ?Occupational History  ? 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  Employer: [employer]  ?Tobacco Use  ? Smoking status: Former  ?  Types: Cigarettes  ?  Quit date: 09/23/1986  ?  Years since quitting: 35.2  ? Smokeless tobacco: Never  ?Substance and Sexual Activity  ? Alcohol use: No  ? Drug use: No  ? Sexual activity: Not on file  ? ? ? ? ? ?

## 2021-11-27 DIAGNOSIS — H26493 Other secondary cataract, bilateral: Secondary | ICD-10-CM | POA: Diagnosis not present

## 2021-11-27 DIAGNOSIS — E113293 Type 2 diabetes mellitus with mild nonproliferative diabetic retinopathy without macular edema, bilateral: Secondary | ICD-10-CM | POA: Diagnosis not present

## 2021-12-09 ENCOUNTER — Ambulatory Visit
Admission: RE | Admit: 2021-12-09 | Discharge: 2021-12-09 | Disposition: A | Discharge status: Home / Self Care | Payer: Medicare HMO | Source: Ambulatory Visit | Attending: Orthopedic Surgery | Admitting: Orthopedic Surgery

## 2021-12-09 ENCOUNTER — Other Ambulatory Visit: Payer: Self-pay

## 2021-12-09 DIAGNOSIS — M48061 Spinal stenosis, lumbar region without neurogenic claudication: Secondary | ICD-10-CM | POA: Diagnosis not present

## 2021-12-09 DIAGNOSIS — M79604 Pain in right leg: Secondary | ICD-10-CM

## 2021-12-09 DIAGNOSIS — M545 Low back pain, unspecified: Secondary | ICD-10-CM | POA: Diagnosis not present

## 2021-12-10 ENCOUNTER — Other Ambulatory Visit: Payer: Medicare HMO

## 2021-12-12 ENCOUNTER — Other Ambulatory Visit: Payer: Self-pay

## 2021-12-12 ENCOUNTER — Encounter: Payer: Self-pay | Admitting: Orthopedic Surgery

## 2021-12-12 ENCOUNTER — Ambulatory Visit (INDEPENDENT_AMBULATORY_CARE_PROVIDER_SITE_OTHER): Payer: Medicare HMO | Admitting: Surgical

## 2021-12-12 DIAGNOSIS — M545 Low back pain, unspecified: Secondary | ICD-10-CM | POA: Diagnosis not present

## 2021-12-12 DIAGNOSIS — M79604 Pain in right leg: Secondary | ICD-10-CM | POA: Diagnosis not present

## 2021-12-12 DIAGNOSIS — M79605 Pain in left leg: Secondary | ICD-10-CM

## 2021-12-12 NOTE — Progress Notes (Signed)
Office Visit Note   Patient: Nancy Myers           Date of Birth: [birth year]           MRN: [id number] Visit Date: 12/12/2021 Requested by: Mila Palmer, MD [address] [place],  [place] [address] PCP: Mila Palmer, MD  Subjective: Chief Complaint  Patient presents with   Other    Scan review    HPI: Nancy Myers is a 76 y.o. female who presents to the office for MRI review. Patient denies any changes in symptoms.  Continues to complain mainly of posterior leg pain from her buttocks to her calf at times.  Denies any numbness or tingling.  She does have worse pain with coughing spells.  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  She is overall able to tolerate this okay as long as she only works 4-hour shifts.  She did a double shift which causes significant increase in her pain and made her exhausted.  She does not take any blood thinners.  Only takes aspirin.  Recent A1c of 7.1.  MRI results revealed: MR Lumbar Spine w/o contrast  Result Date: 12/11/2021 CLINICAL DATA:  Bilateral buttock pain radiating to both knees since September 2022. EXAM: MRI LUMBAR SPINE WITHOUT CONTRAST TECHNIQUE: Multiplanar, multisequence MR imaging of the lumbar spine was performed. No intravenous contrast was administered. COMPARISON:  CT abdomen/pelvis 01/23/2012 FINDINGS: Segmentation: Standard; the lowest formed disc space is designated L5-S1. Alignment: There is mild dextroscoliosis centered at L3. There is grade 1 retrolisthesis of L3 on L4, grade 1 anterolisthesis of L4 on L5, and grade 1 anterolisthesis of L5 on S1, all likely degenerative in nature and increased since 2013. Vertebrae: Vertebral body heights are preserved, without evidence of acute injury. Marrow signal is somewhat heterogeneous throughout without focal or suspicious signal abnormality, likely degenerative in nature. There are Schmorl's nodes indenting the inferior T12 endplate,  inferior L1 endplate and superior L2 endplate. There is mild edema in the left L4 pedicle, likely degenerative/reactive. Conus medullaris and cauda equina: Conus extends to the L1-L2 level. Conus and cauda equina appear normal. Paraspinal and other soft tissues: There is a 2.7 cm left renal cyst., increased in size since 2013. Paraspinal soft tissues are unremarkable. Disc levels: There is multilevel disc desiccation and narrowing, most advanced at L4-L5 and L5-S1. There is multilevel facet arthropathy, most severe at L4-L5 and L5-S1. There are associated effusions at L4-L5 and mild perifacetal soft tissue edema. There are mild disc bulges, degenerative endplate change, and facet arthropathy in the imaged lower thoracic spine without high-grade spinal canal or neural foraminal stenosis. T12-L1: There is a mild disc bulge and bilateral facet arthropathy resulting in mild spinal canal stenosis and mild right and no significant left neural foraminal stenosis L1-L2: There is a mild disc bulge and bilateral facet arthropathy resulting in mild right worse than left neural foraminal stenosis without significant spinal canal stenosis L2-L3: There is a mild disc bulge and bilateral facet arthropathy resulting in mild spinal canal stenosis with crowding of the left subarticular zone and mild left worse than right neural foraminal stenosis L3-L4: There is a diffuse disc bulge, ligamentum flavum thickening, and moderate left worse than right facet arthropathy resulting in mild-to-moderate spinal canal stenosis with effacement of the left subarticular zone and possible impingement of the traversing left L3 nerve root, and severe left and mild right neural foraminal stenosis L4-L5: There is slight uncovering  of the disc posteriorly with a superimposed mild bulge, ligamentum flavum thickening, and severe bilateral facet arthropathy resulting in moderate to severe spinal canal stenosis with effacement of the subarticular zones and  possible impingement of either traversing nerve root, and severe left and moderate right neural foraminal stenosis L5-S1: There is uncovering of the disc posteriorly with a mild superimposed bulge, and advanced bilateral facet arthropathy resulting in moderate to severe left and moderate right neural foraminal stenosis without significant spinal canal stenosis. IMPRESSION: 1. Mild-to-moderate spinal canal stenosis at L3-L4 with effacement of the left subarticular zone and possible impingement of the traversing left L3 nerve root, and severe left neural foraminal stenosis. 2. Moderate to severe spinal canal stenosis at L4-L5 with effacement of the subarticular zones and possible impingement of either traversing nerve root, and severe left and moderate right neural foraminal stenosis. 3. Severe left and moderate right neural foraminal stenosis at L5-S1. 4. Facet arthropathy is most advanced at L4-L5 and L5-S1. There associated small effusions at L4-L5 and mild perifacetal soft tissue edema. Additionally, there is mild reactive marrow edema in the left L4 pedicle. Findings could reflect a source of pain. 5. Mild dextroscoliosis centered at L3 and multilevel grade 1 spondylolisthesis as above. 6. Additional degenerative changes detailed above. Electronically Signed   By: Lesia Hausen M.D.   On: 12/11/2021 08:52                 ROS: All systems reviewed are negative as they relate to the chief complaint within the history of present illness.  Patient denies fevers or chills.  Assessment & Plan: Visit Diagnoses:  1. Bilateral leg pain   2. Low back pain, unspecified back pain laterality, unspecified chronicity, unspecified whether sciatica present     Plan: Nancy Myers is a 76 y.o. female who presents to the office to review MRI of the lumbar spine.  MRI demonstrates multiple findings including mild to moderate spinal canal stenosis at L3-L4 with moderate to severe canal stenosis at L4-L5 along with  moderate to severe foraminal stenosis noted at multiple levels.  Discussed options available to patient.  She does not want to consider any sort of spine surgery at this time.  She also would not like to try an injection.  She wants to try physical therapy upstairs to design a home exercise program to keep up with.  She will try this for several weeks and if no improvement, she will contact the office for L-spine ESI.  Follow-up with the office as needed.  Follow-Up Instructions: No follow-ups on file.   Orders:  Orders Placed This Encounter  Procedures   Ambulatory referral to Physical Therapy   No orders of the defined types were placed in this encounter.     Procedures: No procedures performed   Clinical Data: No additional findings.  Objective: Vital Signs: There were no vitals taken for this visit.  Physical Exam:  Constitutional: Patient appears well-developed HEENT:  Head: Normocephalic Eyes:EOM are normal Neck: Normal range of motion Cardiovascular: Normal rate Pulmonary/chest: Effort normal Neurologic: Patient is alert Skin: Skin is warm Psychiatric: Patient has normal mood and affect  Ortho Exam: Ortho exam demonstrates 5/5 motor strength of bilateral hip flexion, quadricep, hamstring, dorsiflexion, plantarflexion.  No change since prior examination.  Specialty Comments:  No specialty comments available.  Imaging: No results found.   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    GERD (gastroesophageal reflux disease)    IBS (irritable bowel syndrome)    Vitamin D deficiency    Anemia 08/08/2009   Depression, major, in remission (HCC) 08/08/2009   Essential hypertension 08/08/2009   Asthmatic bronchitis  without complication 08/08/2009   Past Medical History:  Diagnosis Date   Anemia    Anxiety    Arrhythmia    Cancer (HCC) 1995   Colon Cancer   Depression    Diabetes mellitus without complication (HCC)    GERD (gastroesophageal reflux disease)    Hyperlipidemia    Hypertension    IBS (irritable bowel syndrome)    Scintillating scotoma of both eyes 08/16/2013   Vitamin D deficiency     Family History  Problem Relation Age of Onset   Heart disease Father        MI   Stroke Mother    Rheum arthritis Mother    Heart attack Brother        half brother   Heart attack Maternal Uncle        half maternal uncle   Breast cancer Neg Hx     Past Surgical History:  Procedure Laterality Date   ANTERIOR CRUCIATE LIGAMENT REPAIR Right 04-09-04   right knee   BUNIONECTOMY Bilateral 02/1987   BUNIONECTOMY Right 03-08-08   right foot   CARPAL TUNNEL RELEASE Bilateral 09-30-01;10-21-01   CATARACT EXTRACTION W/ INTRAOCULAR LENS  IMPLANT, BILATERAL Bilateral 12-07-09;02-15-10   left foot  05-23-05   left knee  07-26-02   SHOULDER SURGERY Left    Social History   Occupational History   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

## 2021-12-31 ENCOUNTER — Ambulatory Visit: Payer: Medicare HMO | Admitting: Physical Therapy

## 2022-01-09 ENCOUNTER — Ambulatory Visit (INDEPENDENT_AMBULATORY_CARE_PROVIDER_SITE_OTHER): Payer: Medicare HMO | Admitting: Physical Therapy

## 2022-01-09 ENCOUNTER — Encounter: Payer: Self-pay | Admitting: Physical Therapy

## 2022-01-09 DIAGNOSIS — M5459 Other low back pain: Secondary | ICD-10-CM

## 2022-01-09 DIAGNOSIS — M6281 Muscle weakness (generalized): Secondary | ICD-10-CM

## 2022-01-09 NOTE — Therapy (Signed)
?OUTPATIENT PHYSICAL THERAPY THORACOLUMBAR EVALUATION ? ? ?Patient Name: Leydy Worthey Postell ?MRN: [id number] ?DOB:03-19-1946, 76 y.o., female ?Today's Date: 01/09/2022 ? ? PT End of Session - 01/09/22 1236   ? ? Visit Number 1   ? PT Start Time 1145   ? PT Stop Time 1227   ? PT Time Calculation (min) 42 min   ? Activity Tolerance Patient tolerated treatment well   ? Behavior During Therapy [facility] for tasks assessed/performed   ? ?  ?  ? ?  ? ? ?Past Medical History:  ?Diagnosis Date  ? Anemia   ? Anxiety   ? Arrhythmia   ? Cancer [facility]) 1995  ? Colon Cancer  ? Depression   ? Diabetes mellitus without complication ([place])   ? GERD (gastroesophageal reflux disease)   ? Hyperlipidemia   ? Hypertension   ? IBS (irritable bowel syndrome)   ? Scintillating scotoma of both eyes 08/16/2013  ? Vitamin D deficiency   ? ?Past Surgical History:  ?Procedure Laterality Date  ? ANTERIOR CRUCIATE LIGAMENT REPAIR Right 04-09-04  ? right knee  ? BUNIONECTOMY Bilateral 02/1987  ? BUNIONECTOMY Right 03-08-08  ? right foot  ? CARPAL TUNNEL RELEASE Bilateral 09-30-01;10-21-01  ? CATARACT EXTRACTION W/ INTRAOCULAR LENS  IMPLANT, BILATERAL Bilateral 12-07-09;02-15-10  ? left foot  05-23-05  ? left knee  07-26-02  ? SHOULDER SURGERY Left   ? ?Patient Active Problem List  ? Diagnosis Date Noted  ? Medication management 03/31/2015  ? B12 deficiency 12/22/2014  ? History of colon cancer, no staging 12/22/2014  ? Noncompliance with medication regimen 08/24/2014  ? Hyperlipidemia   ? 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 GERD (gastroesophageal reflux disease)   ? IBS (irritable bowel syndrome)   ? Vitamin D deficiency   ? Anemia 08/08/2009  ? Depression, major, in remission ([place]) 08/08/2009  ? Essential hypertension 08/08/2009  ? Asthmatic bronchitis without complication 56/38/7564  ? ? ?PCP: Jonathon Jordan, MD ? ?REFERRING PROVIDER: Donella Stade, PA-C ? ?REFERRING DIAG: M79.604,M79.605 (ICD-10-CM) - Bilateral leg pain M54.50 (ICD-10-CM) - Low  back pain, unspecified back pain laterality, unspecified chronicity, unspecified whether sciatica present  ? ?THERAPY DIAG:  ?Other low back pain - Plan: PT plan of care cert/re-cert ? ?Muscle weakness (generalized) - Plan: PT plan of care cert/re-cert ? ?ONSET DATE: Sept 2022 ? ?SUBJECTIVE:                                                                                                                                                                                          ? ?SUBJECTIVE STATEMENT: ?Pt is a 76 y/o female who  presents to OPPT for sudden onset of low back and bil leg pain in September 2022.  She reports pain persisted and followed up with PA in December.  She had MRI but is not interested in surgery or injections at this time. ? ?PERTINENT HISTORY:  ?Anemia, depression, HTN, HLD, DM with neuropathy, hx colon cancer ? ?PAIN:  ?Are you having pain? Yes: NPRS scale: did not rate/10 ?Pain location: back and bil hips/thighs ?Pain description: aching, tight muscle, spasm ?Aggravating factors: "it seems like everything" ?Relieving factors: topical creams, tylenol, muscle relaxers, heat ? ? ?PRECAUTIONS: None ? ?WEIGHT BEARING RESTRICTIONS No ? ?FALLS:  ?Has patient fallen in last 6 months? No ? ?LIVING ENVIRONMENT: ?No stairs at home ? 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 ?PLOF: Independent and Leisure: nothing; movies with grandson ? ?PATIENT GOALS learn exercises ? ? ?OBJECTIVE:  ? ?DIAGNOSTIC FINDINGS:  ?MRI demonstrates multiple findings including mild to moderate spinal canal stenosis at L3-L4 with moderate to severe canal stenosis at L4-L5 along with moderate to severe foraminal stenosis noted at multiple levels  ? ?PATIENT SURVEYS:  ?Deferred today due to 1x eval ? ?SCREENING FOR RED FLAGS: ?Bowel or bladder incontinence: No ?Spinal tumors: No ?Cauda equina syndrome: No ?Compression fracture: No ?Abdominal aneurysm: No ? ?COGNITION: ? Overall cognitive status: Within  functional limits for tasks assessed   ?  ?SENSATION: ?Not tested ? ?MUSCLE LENGTH: ?Hamstrings: WNL ? ?POSTURE:  ?Increased lumbar lordosis ? ?PALPATION: ?No significant tenderness or trigger points noted ? ?LUMBAR ROM:  ? ?Active  A/PROM  ?01/09/2022  ?Flexion WNL  ?Extension Limited 50% with pain  ?Right lateral flexion WNL  ?Left lateral flexion Limited 25% with pain  ?Right rotation Limited 25%  ?Left rotation Limited 25%  ? (Blank rows = not tested) ? ? ?LE MMT: ? ?MMT Right ?01/09/2022 Left ?01/09/2022  ?Hip flexion 3/5 3/5  ?Hip extension 3/5 3/5  ?Hip abduction 3/5 3/5  ? (Blank rows = not tested) ? ?LUMBAR SPECIAL TESTS:  ?Slump test: Negative ? ? ?GAIT: ?Comments: independent ? ? ? ?TODAY'S TREATMENT  ?01/09/22:  See HEP: reviewed and performed PRN.  Did have some ant hip pain with piriformis stretch on Rt, but modified to sitting. ? ? ?PATIENT EDUCATION:  ?Education details: HEP ?Person educated: Patient ?Education method: Explanation, Demonstration, and Handouts ?Education comprehension: verbalized understanding, returned demonstration, and needs further education ? ? ?HOME EXERCISE PROGRAM: ?Access Code: [email or web address] ?URL: [email or web address] ?Date: 01/09/2022 ?Prepared by: Faustino Congress ? ?Exercises ?- Hooklying Single Knee to Chest  - 2 x daily - 7 x weekly - 1 sets - 3 reps - 30 sec hold ?- Supine Posterior Pelvic Tilt  - 2 x daily - 7 x weekly - 1 sets - 10 reps - 5 sec hold ?- Seated Piriformis Stretch with Trunk Bend  - 2 x daily - 7 x weekly - 1 sets - 3 reps - 30 sec hold ?- Seated Flexion Stretch with Swiss Ball  - 1 x daily - 7 x weekly - 3 sets - 10 reps ?- Standing Hip Extension with Counter Support  - 1 x daily - 7 x weekly - 1 sets - 20 reps ?- Standing Hip Abduction with Counter Support  - 1 x daily - 7 x weekly - 1 sets - 20 reps ? ?ASSESSMENT: ? ?CLINICAL IMPRESSION: ?Patient is a 76 y.o. female who was seen today for physical therapy evaluation and treatment for LBP.  She  demonstrates decreased  ROM and strength as well as postural abnormalities affecting functional mobility.  Pt requesting 1x eval to work on [employer].  ? ? ?OBJECTIVE IMPAIRMENTS decreased ROM, decreased strength, postural dysfunction, and pain.  ? ?ACTIVITY LIMITATIONS cleaning, community activity, driving, occupation, laundry, and yard work.  ? ?PERSONAL FACTORS 3+ comorbidities: Anemia, depression, HTN, HLD, DM with neuropathy, hx colon cancer  are also affecting patient's functional outcome.  ? ? ?REHAB POTENTIAL: Good ? ?CLINICAL DECISION MAKING: Evolving/moderate complexity ? ?EVALUATION COMPLEXITY: Moderate ? ? ?GOALS: ? ?LONG TERM GOALS: Target date:  01/09/22 ? ?Independent with final HEP ?Goal status: MET 01/09/22 ? ?PLAN: ?PT FREQUENCY: one time visit ? ?PT DURATION: other: 1 time visit ? ?PLANNED INTERVENTIONS: Therapeutic exercises and Patient/Family education. ? ?PLAN FOR NEXT SESSION: 1x eval ? ? ? ? ?Laureen Abrahams, PT, DPT ?01/09/22 12:38 PM ? ? ? ?

## 2022-05-17 DIAGNOSIS — Z0000 Encounter for general adult medical examination without abnormal findings: Secondary | ICD-10-CM | POA: Diagnosis not present

## 2022-05-17 DIAGNOSIS — E785 Hyperlipidemia, unspecified: Secondary | ICD-10-CM | POA: Diagnosis not present

## 2022-05-17 DIAGNOSIS — I10 Essential (primary) hypertension: Secondary | ICD-10-CM | POA: Diagnosis not present

## 2022-05-17 DIAGNOSIS — E1169 Type 2 diabetes mellitus with other specified complication: Secondary | ICD-10-CM | POA: Diagnosis not present

## 2022-05-17 DIAGNOSIS — Z79899 Other long term (current) drug therapy: Secondary | ICD-10-CM | POA: Diagnosis not present

## 2022-05-17 DIAGNOSIS — F330 Major depressive disorder, recurrent, mild: Secondary | ICD-10-CM | POA: Diagnosis not present

## 2022-05-17 DIAGNOSIS — E559 Vitamin D deficiency, unspecified: Secondary | ICD-10-CM | POA: Diagnosis not present

## 2022-05-17 DIAGNOSIS — E538 Deficiency of other specified B group vitamins: Secondary | ICD-10-CM | POA: Diagnosis not present

## 2022-05-17 DIAGNOSIS — K219 Gastro-esophageal reflux disease without esophagitis: Secondary | ICD-10-CM | POA: Diagnosis not present

## 2022-05-17 DIAGNOSIS — E1142 Type 2 diabetes mellitus with diabetic polyneuropathy: Secondary | ICD-10-CM | POA: Diagnosis not present

## 2022-05-17 DIAGNOSIS — I700 Atherosclerosis of aorta: Secondary | ICD-10-CM | POA: Diagnosis not present

## 2022-06-25 DIAGNOSIS — M542 Cervicalgia: Secondary | ICD-10-CM | POA: Diagnosis not present

## 2022-06-25 DIAGNOSIS — M5440 Lumbago with sciatica, unspecified side: Secondary | ICD-10-CM | POA: Diagnosis not present

## 2022-07-26 ENCOUNTER — Other Ambulatory Visit: Payer: Self-pay | Admitting: Family Medicine

## 2022-07-26 DIAGNOSIS — Z1231 Encounter for screening mammogram for malignant neoplasm of breast: Secondary | ICD-10-CM

## 2022-08-22 DIAGNOSIS — E785 Hyperlipidemia, unspecified: Secondary | ICD-10-CM | POA: Diagnosis not present

## 2022-08-22 DIAGNOSIS — R159 Full incontinence of feces: Secondary | ICD-10-CM | POA: Diagnosis not present

## 2022-08-22 DIAGNOSIS — K529 Noninfective gastroenteritis and colitis, unspecified: Secondary | ICD-10-CM | POA: Diagnosis not present

## 2022-09-25 ENCOUNTER — Ambulatory Visit
Admission: RE | Admit: 2022-09-25 | Discharge: 2022-09-25 | Disposition: A | Discharge status: Home / Self Care | Payer: Medicare HMO | Source: Ambulatory Visit | Attending: Family Medicine | Admitting: Family Medicine

## 2022-09-25 DIAGNOSIS — Z1231 Encounter for screening mammogram for malignant neoplasm of breast: Secondary | ICD-10-CM | POA: Diagnosis not present

## 2022-09-26 DIAGNOSIS — M48061 Spinal stenosis, lumbar region without neurogenic claudication: Secondary | ICD-10-CM | POA: Diagnosis not present

## 2022-09-26 DIAGNOSIS — Z6825 Body mass index (BMI) 25.0-25.9, adult: Secondary | ICD-10-CM | POA: Diagnosis not present

## 2022-10-17 DIAGNOSIS — M48061 Spinal stenosis, lumbar region without neurogenic claudication: Secondary | ICD-10-CM | POA: Diagnosis not present

## 2022-10-21 DIAGNOSIS — Z85038 Personal history of other malignant neoplasm of large intestine: Secondary | ICD-10-CM | POA: Diagnosis not present

## 2022-10-21 DIAGNOSIS — K648 Other hemorrhoids: Secondary | ICD-10-CM | POA: Diagnosis not present

## 2022-10-21 DIAGNOSIS — K529 Noninfective gastroenteritis and colitis, unspecified: Secondary | ICD-10-CM | POA: Diagnosis not present

## 2022-10-21 DIAGNOSIS — K625 Hemorrhage of anus and rectum: Secondary | ICD-10-CM | POA: Diagnosis not present

## 2022-11-07 DIAGNOSIS — M5416 Radiculopathy, lumbar region: Secondary | ICD-10-CM | POA: Diagnosis not present

## 2022-11-20 DIAGNOSIS — M791 Myalgia, unspecified site: Secondary | ICD-10-CM | POA: Diagnosis not present

## 2022-11-20 DIAGNOSIS — E1142 Type 2 diabetes mellitus with diabetic polyneuropathy: Secondary | ICD-10-CM | POA: Diagnosis not present

## 2022-11-20 DIAGNOSIS — E559 Vitamin D deficiency, unspecified: Secondary | ICD-10-CM | POA: Diagnosis not present

## 2022-11-20 DIAGNOSIS — G9520 Unspecified cord compression: Secondary | ICD-10-CM | POA: Diagnosis not present

## 2022-12-04 DIAGNOSIS — Z85038 Personal history of other malignant neoplasm of large intestine: Secondary | ICD-10-CM | POA: Diagnosis not present

## 2022-12-04 DIAGNOSIS — K529 Noninfective gastroenteritis and colitis, unspecified: Secondary | ICD-10-CM | POA: Diagnosis not present

## 2022-12-04 DIAGNOSIS — K648 Other hemorrhoids: Secondary | ICD-10-CM | POA: Diagnosis not present

## 2022-12-18 DIAGNOSIS — H524 Presbyopia: Secondary | ICD-10-CM | POA: Diagnosis not present

## 2022-12-18 DIAGNOSIS — H02831 Dermatochalasis of right upper eyelid: Secondary | ICD-10-CM | POA: Diagnosis not present

## 2022-12-18 DIAGNOSIS — Z961 Presence of intraocular lens: Secondary | ICD-10-CM | POA: Diagnosis not present

## 2022-12-18 DIAGNOSIS — H02834 Dermatochalasis of left upper eyelid: Secondary | ICD-10-CM | POA: Diagnosis not present

## 2022-12-18 DIAGNOSIS — H26493 Other secondary cataract, bilateral: Secondary | ICD-10-CM | POA: Diagnosis not present

## 2022-12-18 DIAGNOSIS — E113293 Type 2 diabetes mellitus with mild nonproliferative diabetic retinopathy without macular edema, bilateral: Secondary | ICD-10-CM | POA: Diagnosis not present

## 2023-01-29 DIAGNOSIS — I2510 Atherosclerotic heart disease of native coronary artery without angina pectoris: Secondary | ICD-10-CM | POA: Diagnosis not present

## 2023-01-29 DIAGNOSIS — E1142 Type 2 diabetes mellitus with diabetic polyneuropathy: Secondary | ICD-10-CM | POA: Diagnosis not present

## 2023-01-29 DIAGNOSIS — E11319 Type 2 diabetes mellitus with unspecified diabetic retinopathy without macular edema: Secondary | ICD-10-CM | POA: Diagnosis not present

## 2023-01-29 DIAGNOSIS — F330 Major depressive disorder, recurrent, mild: Secondary | ICD-10-CM | POA: Diagnosis not present

## 2023-01-29 DIAGNOSIS — I700 Atherosclerosis of aorta: Secondary | ICD-10-CM | POA: Diagnosis not present

## 2023-03-10 DIAGNOSIS — E113299 Type 2 diabetes mellitus with mild nonproliferative diabetic retinopathy without macular edema, unspecified eye: Secondary | ICD-10-CM | POA: Diagnosis not present

## 2023-03-10 DIAGNOSIS — E1142 Type 2 diabetes mellitus with diabetic polyneuropathy: Secondary | ICD-10-CM | POA: Diagnosis not present

## 2023-03-10 DIAGNOSIS — R252 Cramp and spasm: Secondary | ICD-10-CM | POA: Diagnosis not present

## 2023-03-10 DIAGNOSIS — M159 Polyosteoarthritis, unspecified: Secondary | ICD-10-CM | POA: Diagnosis not present

## 2023-04-08 DIAGNOSIS — R6883 Chills (without fever): Secondary | ICD-10-CM | POA: Diagnosis not present

## 2023-04-08 DIAGNOSIS — R5383 Other fatigue: Secondary | ICD-10-CM | POA: Diagnosis not present

## 2023-04-08 DIAGNOSIS — R051 Acute cough: Secondary | ICD-10-CM | POA: Diagnosis not present

## 2023-04-08 DIAGNOSIS — R52 Pain, unspecified: Secondary | ICD-10-CM | POA: Diagnosis not present

## 2023-04-08 DIAGNOSIS — U071 COVID-19: Secondary | ICD-10-CM | POA: Diagnosis not present

## 2023-04-22 DIAGNOSIS — R600 Localized edema: Secondary | ICD-10-CM | POA: Diagnosis not present

## 2023-04-22 DIAGNOSIS — M1990 Unspecified osteoarthritis, unspecified site: Secondary | ICD-10-CM | POA: Diagnosis not present

## 2023-04-22 DIAGNOSIS — U071 COVID-19: Secondary | ICD-10-CM | POA: Diagnosis not present

## 2023-05-08 DIAGNOSIS — Z6825 Body mass index (BMI) 25.0-25.9, adult: Secondary | ICD-10-CM | POA: Diagnosis not present

## 2023-05-08 DIAGNOSIS — M5416 Radiculopathy, lumbar region: Secondary | ICD-10-CM | POA: Diagnosis not present

## 2023-05-21 ENCOUNTER — Ambulatory Visit: Payer: Medicare HMO | Admitting: Orthopedic Surgery

## 2023-05-21 ENCOUNTER — Other Ambulatory Visit (INDEPENDENT_AMBULATORY_CARE_PROVIDER_SITE_OTHER): Payer: Medicare HMO

## 2023-05-21 DIAGNOSIS — M25561 Pain in right knee: Secondary | ICD-10-CM

## 2023-05-21 NOTE — Progress Notes (Unsigned)
Office Visit Note   Patient: Nancy Myers           Date of Birth: 04/27/1946           MRN: [id number] Visit Date: 05/21/2023 Requested by: Mila Palmer, MD [address] [place],  [place] [address] PCP: Mila Palmer, MD  Subjective: Chief Complaint  Patient presents with   Right Knee - Pain    Patient reports right knee pain and stiffness with difficulty ambulating at times    HPI: Nancy Myers is a 77 y.o. female who presents to the office reporting right knee pain.  She has had pain on and off for a week.  Reports start up pain.  States she is ambulating with a limp.  Has 20 her history of right knee ACL reconstruction.  That was done in 2005.  No instability.  Also has history of bunion surgery 1988.  She is here mostly just to make sure nothing "bad" is going on in her knee..                ROS: All systems reviewed are negative as they relate to the chief complaint within the history of present illness.  Patient denies fevers or chills.  Assessment & Plan: Visit Diagnoses:  1. Right knee pain, unspecified chronicity     Plan: Impression is radiographs which show mild arthritis but very stable knee with no effusion.  Overall she is improving slightly.  She is going to continue with observation.  Could consider an injection in the knee if symptoms worsen but no indication for intervention at this time.  Follow-up as needed.  Follow-Up Instructions: No follow-ups on file.   Orders:  Orders Placed This Encounter  Procedures   XR KNEE 3 VIEW RIGHT   No orders of the defined types were placed in this encounter.     Procedures: No procedures performed   Clinical Data: No additional findings.  Objective: Vital Signs: There were no vitals taken for this visit.  Physical Exam:  Constitutional: Patient appears well-developed HEENT:  Head: Normocephalic Eyes:EOM are normal Neck: Normal range of motion Cardiovascular: Normal  rate Pulmonary/chest: Effort normal Neurologic: Patient is alert Skin: Skin is warm Psychiatric: Patient has normal mood and affect  Ortho Exam: Ortho exam demonstrates full active and passive range of motion of the right knee.  Collateral and cruciate ligaments are stable.  Range of motion full except for lack of about 5 degrees of extension on that right knee.  No groin pain with internal extra rotation of the leg.  Pedal pulses palpable.  Ankle dorsiflexion intact.  Specialty Comments:  No specialty comments available.  Imaging: XR KNEE 3 VIEW RIGHT  Result Date: 05/21/2023 AP lateral merchant radiographs right knee reviewed.  Prior hardware from ACL reconstruction visualized.  Joint spaces maintained but there is moderate arthritis in the medial and lateral compartments.  No acute fracture.  Alignment intact.  Mild spurring on the medial tibial plateau and medial distal femur.  No loose bodies.    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    GERD (gastroesophageal reflux disease)    IBS (irritable bowel syndrome)    Vitamin D deficiency    Anemia 08/08/2009  Depression, major, in remission (HCC) 08/08/2009   Essential hypertension 08/08/2009   Asthmatic bronchitis without complication 08/08/2009   Past Medical History:  Diagnosis Date   Anemia    Anxiety    Arrhythmia    Cancer (HCC) 1995   Colon Cancer   Depression    Diabetes mellitus without complication (HCC)    GERD (gastroesophageal reflux disease)    Hyperlipidemia    Hypertension    IBS (irritable bowel syndrome)    Scintillating scotoma of both eyes 08/16/2013   Vitamin D deficiency     Family History  Problem Relation Age of Onset   Heart disease Father         MI   Stroke Mother    Rheum arthritis Mother    Heart attack Brother        half brother   Heart attack Maternal Uncle        half maternal uncle   Breast cancer Neg Hx     Past Surgical History:  Procedure Laterality Date   ANTERIOR CRUCIATE LIGAMENT REPAIR Right 04-09-04   right knee   BUNIONECTOMY Bilateral 02/1987   BUNIONECTOMY Right 03-08-08   right foot   CARPAL TUNNEL RELEASE Bilateral 09-30-01;10-21-01   CATARACT EXTRACTION W/ INTRAOCULAR LENS  IMPLANT, BILATERAL Bilateral 12-07-09;02-15-10   left foot  05-23-05   left knee  07-26-02   SHOULDER SURGERY Left    Social History   Occupational History   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

## 2023-05-29 DIAGNOSIS — E785 Hyperlipidemia, unspecified: Secondary | ICD-10-CM | POA: Diagnosis not present

## 2023-05-29 DIAGNOSIS — Z9181 History of falling: Secondary | ICD-10-CM | POA: Diagnosis not present

## 2023-05-29 DIAGNOSIS — M9983 Other biomechanical lesions of lumbar region: Secondary | ICD-10-CM | POA: Diagnosis not present

## 2023-05-29 DIAGNOSIS — Z23 Encounter for immunization: Secondary | ICD-10-CM | POA: Diagnosis not present

## 2023-05-29 DIAGNOSIS — Z79899 Other long term (current) drug therapy: Secondary | ICD-10-CM | POA: Diagnosis not present

## 2023-05-29 DIAGNOSIS — E559 Vitamin D deficiency, unspecified: Secondary | ICD-10-CM | POA: Diagnosis not present

## 2023-05-29 DIAGNOSIS — E1142 Type 2 diabetes mellitus with diabetic polyneuropathy: Secondary | ICD-10-CM | POA: Diagnosis not present

## 2023-05-29 DIAGNOSIS — M791 Myalgia, unspecified site: Secondary | ICD-10-CM | POA: Diagnosis not present

## 2023-05-29 DIAGNOSIS — Z0000 Encounter for general adult medical examination without abnormal findings: Secondary | ICD-10-CM | POA: Diagnosis not present

## 2023-05-30 DIAGNOSIS — E1169 Type 2 diabetes mellitus with other specified complication: Secondary | ICD-10-CM | POA: Diagnosis not present

## 2023-06-16 ENCOUNTER — Encounter: Payer: Self-pay | Admitting: Podiatry

## 2023-06-16 ENCOUNTER — Ambulatory Visit (INDEPENDENT_AMBULATORY_CARE_PROVIDER_SITE_OTHER): Payer: Medicare HMO | Admitting: Podiatry

## 2023-06-16 VITALS — BP 128/84 | HR 71 | Temp 98.0°F | Resp 18 | Ht 60.0 in | Wt 142.0 lb

## 2023-06-16 DIAGNOSIS — M79675 Pain in left toe(s): Secondary | ICD-10-CM

## 2023-06-16 DIAGNOSIS — B351 Tinea unguium: Secondary | ICD-10-CM | POA: Diagnosis not present

## 2023-06-16 DIAGNOSIS — M79674 Pain in right toe(s): Secondary | ICD-10-CM

## 2023-06-16 NOTE — Progress Notes (Signed)
Subjective:   Patient ID: Nancy Myers, female   DOB: 77 y.o.   MRN: [id number]   HPI Patient presents significant elongation nailbeds 1-5 both feet thick yellow and brittle and hard for her to cut.  States has been going on for a long time and she is needed to do this   Review of Systems  All other systems reviewed and are negative.       Objective:  Physical Exam Vitals and nursing note reviewed.  Constitutional:      Appearance: She is well-developed.  Pulmonary:     Effort: Pulmonary effort is normal.  Musculoskeletal:        General: Normal range of motion.  Skin:    General: Skin is warm.  Neurological:     Mental Status: She is alert.     Neurovascular status intact muscle strength adequate range of motion adequate with patient found to have severely elongated nailbeds 1-5 both feet that are thick dystrophic and can be painful with shoe gear.  Good digital perfusion well-oriented     Assessment:  Chronic mycotic nail infection with thickness pain 1-5 both feet     Plan:  H&P reviewed and explained condition debrided nailbeds 1-5 both feet there is some dark color explained that is normal part of her pigment and do not see any current treatment for that and patient can see [place] for treatment or pedicure

## 2023-08-12 DIAGNOSIS — Z23 Encounter for immunization: Secondary | ICD-10-CM | POA: Diagnosis not present

## 2023-08-12 DIAGNOSIS — E113299 Type 2 diabetes mellitus with mild nonproliferative diabetic retinopathy without macular edema, unspecified eye: Secondary | ICD-10-CM | POA: Diagnosis not present

## 2023-08-20 ENCOUNTER — Other Ambulatory Visit: Payer: Self-pay | Admitting: Family Medicine

## 2023-08-20 DIAGNOSIS — Z1231 Encounter for screening mammogram for malignant neoplasm of breast: Secondary | ICD-10-CM

## 2023-09-02 DIAGNOSIS — B349 Viral infection, unspecified: Secondary | ICD-10-CM | POA: Diagnosis not present

## 2023-09-29 ENCOUNTER — Ambulatory Visit
Admission: RE | Admit: 2023-09-29 | Discharge: 2023-09-29 | Disposition: A | Discharge status: Home / Self Care | Payer: Medicare HMO | Source: Ambulatory Visit | Attending: Family Medicine | Admitting: Family Medicine

## 2023-09-29 DIAGNOSIS — Z1231 Encounter for screening mammogram for malignant neoplasm of breast: Secondary | ICD-10-CM | POA: Diagnosis not present

## 2023-10-15 DIAGNOSIS — R159 Full incontinence of feces: Secondary | ICD-10-CM | POA: Diagnosis not present

## 2023-10-15 DIAGNOSIS — R198 Other specified symptoms and signs involving the digestive system and abdomen: Secondary | ICD-10-CM | POA: Diagnosis not present

## 2023-12-19 DIAGNOSIS — H5212 Myopia, left eye: Secondary | ICD-10-CM | POA: Diagnosis not present

## 2023-12-19 DIAGNOSIS — Z961 Presence of intraocular lens: Secondary | ICD-10-CM | POA: Diagnosis not present

## 2023-12-19 DIAGNOSIS — E119 Type 2 diabetes mellitus without complications: Secondary | ICD-10-CM | POA: Diagnosis not present

## 2024-01-28 DIAGNOSIS — J4530 Mild persistent asthma, uncomplicated: Secondary | ICD-10-CM | POA: Diagnosis not present

## 2024-01-28 DIAGNOSIS — R059 Cough, unspecified: Secondary | ICD-10-CM | POA: Diagnosis not present

## 2024-03-10 DIAGNOSIS — M9983 Other biomechanical lesions of lumbar region: Secondary | ICD-10-CM | POA: Diagnosis not present

## 2024-03-10 DIAGNOSIS — E1142 Type 2 diabetes mellitus with diabetic polyneuropathy: Secondary | ICD-10-CM | POA: Diagnosis not present

## 2024-03-24 DIAGNOSIS — E1142 Type 2 diabetes mellitus with diabetic polyneuropathy: Secondary | ICD-10-CM | POA: Diagnosis not present

## 2024-03-24 DIAGNOSIS — E11319 Type 2 diabetes mellitus with unspecified diabetic retinopathy without macular edema: Secondary | ICD-10-CM | POA: Diagnosis not present

## 2024-03-24 DIAGNOSIS — I2510 Atherosclerotic heart disease of native coronary artery without angina pectoris: Secondary | ICD-10-CM | POA: Diagnosis not present

## 2024-03-24 DIAGNOSIS — E113299 Type 2 diabetes mellitus with mild nonproliferative diabetic retinopathy without macular edema, unspecified eye: Secondary | ICD-10-CM | POA: Diagnosis not present

## 2024-03-25 DIAGNOSIS — E11319 Type 2 diabetes mellitus with unspecified diabetic retinopathy without macular edema: Secondary | ICD-10-CM | POA: Diagnosis not present

## 2024-03-25 DIAGNOSIS — E1142 Type 2 diabetes mellitus with diabetic polyneuropathy: Secondary | ICD-10-CM | POA: Diagnosis not present

## 2024-03-25 DIAGNOSIS — I2510 Atherosclerotic heart disease of native coronary artery without angina pectoris: Secondary | ICD-10-CM | POA: Diagnosis not present

## 2024-03-25 DIAGNOSIS — E113299 Type 2 diabetes mellitus with mild nonproliferative diabetic retinopathy without macular edema, unspecified eye: Secondary | ICD-10-CM | POA: Diagnosis not present

## 2024-04-02 DIAGNOSIS — E538 Deficiency of other specified B group vitamins: Secondary | ICD-10-CM | POA: Diagnosis not present

## 2024-04-02 DIAGNOSIS — R5383 Other fatigue: Secondary | ICD-10-CM | POA: Diagnosis not present

## 2024-04-22 DIAGNOSIS — F330 Major depressive disorder, recurrent, mild: Secondary | ICD-10-CM | POA: Diagnosis not present

## 2024-04-22 DIAGNOSIS — I2510 Atherosclerotic heart disease of native coronary artery without angina pectoris: Secondary | ICD-10-CM | POA: Diagnosis not present

## 2024-04-22 DIAGNOSIS — E113299 Type 2 diabetes mellitus with mild nonproliferative diabetic retinopathy without macular edema, unspecified eye: Secondary | ICD-10-CM | POA: Diagnosis not present

## 2024-04-22 DIAGNOSIS — E785 Hyperlipidemia, unspecified: Secondary | ICD-10-CM | POA: Diagnosis not present

## 2024-04-22 DIAGNOSIS — E1142 Type 2 diabetes mellitus with diabetic polyneuropathy: Secondary | ICD-10-CM | POA: Diagnosis not present

## 2024-04-22 DIAGNOSIS — E11319 Type 2 diabetes mellitus with unspecified diabetic retinopathy without macular edema: Secondary | ICD-10-CM | POA: Diagnosis not present

## 2024-04-22 DIAGNOSIS — M1990 Unspecified osteoarthritis, unspecified site: Secondary | ICD-10-CM | POA: Diagnosis not present

## 2024-05-22 DIAGNOSIS — I2510 Atherosclerotic heart disease of native coronary artery without angina pectoris: Secondary | ICD-10-CM | POA: Diagnosis not present

## 2024-05-22 DIAGNOSIS — E113299 Type 2 diabetes mellitus with mild nonproliferative diabetic retinopathy without macular edema, unspecified eye: Secondary | ICD-10-CM | POA: Diagnosis not present

## 2024-05-22 DIAGNOSIS — R051 Acute cough: Secondary | ICD-10-CM | POA: Diagnosis not present

## 2024-05-22 DIAGNOSIS — E11319 Type 2 diabetes mellitus with unspecified diabetic retinopathy without macular edema: Secondary | ICD-10-CM | POA: Diagnosis not present

## 2024-05-22 DIAGNOSIS — E1142 Type 2 diabetes mellitus with diabetic polyneuropathy: Secondary | ICD-10-CM | POA: Diagnosis not present

## 2024-05-22 DIAGNOSIS — U071 COVID-19: Secondary | ICD-10-CM | POA: Diagnosis not present

## 2024-05-22 DIAGNOSIS — R5081 Fever presenting with conditions classified elsewhere: Secondary | ICD-10-CM | POA: Diagnosis not present

## 2024-05-23 DIAGNOSIS — M1990 Unspecified osteoarthritis, unspecified site: Secondary | ICD-10-CM | POA: Diagnosis not present

## 2024-05-23 DIAGNOSIS — I2510 Atherosclerotic heart disease of native coronary artery without angina pectoris: Secondary | ICD-10-CM | POA: Diagnosis not present

## 2024-05-23 DIAGNOSIS — E113299 Type 2 diabetes mellitus with mild nonproliferative diabetic retinopathy without macular edema, unspecified eye: Secondary | ICD-10-CM | POA: Diagnosis not present

## 2024-05-23 DIAGNOSIS — F330 Major depressive disorder, recurrent, mild: Secondary | ICD-10-CM | POA: Diagnosis not present

## 2024-05-23 DIAGNOSIS — E1142 Type 2 diabetes mellitus with diabetic polyneuropathy: Secondary | ICD-10-CM | POA: Diagnosis not present

## 2024-05-23 DIAGNOSIS — E785 Hyperlipidemia, unspecified: Secondary | ICD-10-CM | POA: Diagnosis not present

## 2024-05-23 DIAGNOSIS — E11319 Type 2 diabetes mellitus with unspecified diabetic retinopathy without macular edema: Secondary | ICD-10-CM | POA: Diagnosis not present

## 2024-07-16 ENCOUNTER — Other Ambulatory Visit (HOSPITAL_BASED_OUTPATIENT_CLINIC_OR_DEPARTMENT_OTHER): Payer: Self-pay | Admitting: Adult Health Nurse Practitioner

## 2024-07-16 DIAGNOSIS — Z780 Asymptomatic menopausal state: Secondary | ICD-10-CM

## 2024-09-29 ENCOUNTER — Other Ambulatory Visit: Payer: Self-pay | Admitting: Adult Health Nurse Practitioner

## 2024-09-29 DIAGNOSIS — Z1231 Encounter for screening mammogram for malignant neoplasm of breast: Secondary | ICD-10-CM

## 2024-10-04 ENCOUNTER — Ambulatory Visit

## 2024-10-05 ENCOUNTER — Inpatient Hospital Stay
Admission: RE | Admit: 2024-10-05 | Discharge: 2024-10-05 | Attending: Adult Health Nurse Practitioner | Admitting: Adult Health Nurse Practitioner

## 2024-10-05 DIAGNOSIS — Z1231 Encounter for screening mammogram for malignant neoplasm of breast: Secondary | ICD-10-CM

## 2025-01-25 ENCOUNTER — Other Ambulatory Visit (HOSPITAL_BASED_OUTPATIENT_CLINIC_OR_DEPARTMENT_OTHER)
